# Patient Record
Sex: Female | Born: 1950
Health system: Southern US, Community
[De-identification: ages and names within clinical notes are randomized; demographics above are authoritative.]

## PROBLEM LIST (undated history)

## (undated) DIAGNOSIS — K219 Gastro-esophageal reflux disease without esophagitis: Secondary | ICD-10-CM

## (undated) DIAGNOSIS — E213 Hyperparathyroidism, unspecified: Secondary | ICD-10-CM

## (undated) DIAGNOSIS — H269 Unspecified cataract: Secondary | ICD-10-CM

## (undated) DIAGNOSIS — N390 Urinary tract infection, site not specified: Secondary | ICD-10-CM

## (undated) DIAGNOSIS — M5416 Radiculopathy, lumbar region: Secondary | ICD-10-CM

## (undated) DIAGNOSIS — M810 Age-related osteoporosis without current pathological fracture: Secondary | ICD-10-CM

## (undated) DIAGNOSIS — M1990 Unspecified osteoarthritis, unspecified site: Secondary | ICD-10-CM

## (undated) DIAGNOSIS — F32A Depression, unspecified: Secondary | ICD-10-CM

## (undated) DIAGNOSIS — J45909 Unspecified asthma, uncomplicated: Secondary | ICD-10-CM

## (undated) DIAGNOSIS — M8580 Other specified disorders of bone density and structure, unspecified site: Secondary | ICD-10-CM

## (undated) DIAGNOSIS — E559 Vitamin D deficiency, unspecified: Secondary | ICD-10-CM

## (undated) DIAGNOSIS — D649 Anemia, unspecified: Secondary | ICD-10-CM

## (undated) DIAGNOSIS — E785 Hyperlipidemia, unspecified: Secondary | ICD-10-CM

## (undated) DIAGNOSIS — J40 Bronchitis, not specified as acute or chronic: Secondary | ICD-10-CM

## (undated) DIAGNOSIS — F4320 Adjustment disorder, unspecified: Secondary | ICD-10-CM

## (undated) DIAGNOSIS — T7840XA Allergy, unspecified, initial encounter: Secondary | ICD-10-CM

## (undated) HISTORY — DX: Radiculopathy, lumbar region: M54.16

## (undated) HISTORY — PX: BREAST LUMPECTOMY: SHX2

## (undated) HISTORY — DX: Age-related osteoporosis without current pathological fracture: M81.0

## (undated) HISTORY — DX: Hyperparathyroidism, unspecified: E21.3

## (undated) HISTORY — DX: Hyperlipidemia, unspecified: E78.5

## (undated) HISTORY — DX: Vitamin D deficiency, unspecified: E55.9

## (undated) HISTORY — DX: Adjustment disorder, unspecified: F43.20

## (undated) HISTORY — DX: Anemia, unspecified: D64.9

## (undated) HISTORY — DX: Unspecified cataract: H26.9

## (undated) HISTORY — DX: Hemochromatosis, unspecified: E83.119

## (undated) HISTORY — DX: Unspecified asthma, uncomplicated: J45.909

## (undated) HISTORY — DX: Unspecified osteoarthritis, unspecified site: M19.90

## (undated) HISTORY — DX: Bronchitis, not specified as acute or chronic: J40

## (undated) HISTORY — PX: POLYPECTOMY: SHX149

## (undated) HISTORY — DX: Allergy, unspecified, initial encounter: T78.40XA

## (undated) HISTORY — DX: Other specified disorders of bone density and structure, unspecified site: M85.80

## (undated) HISTORY — PX: COLONOSCOPY: SHX174

## (undated) HISTORY — DX: Gastro-esophageal reflux disease without esophagitis: K21.9

## (undated) HISTORY — DX: Urinary tract infection, site not specified: N39.0

## (undated) HISTORY — PX: BREAST BIOPSY: SHX20

---

## 1998-08-01 ENCOUNTER — Other Ambulatory Visit: Admission: RE | Admit: 1998-08-01 | Discharge: 1998-08-01 | Payer: Self-pay | Admitting: Obstetrics & Gynecology

## 1999-07-18 ENCOUNTER — Other Ambulatory Visit: Admission: RE | Admit: 1999-07-18 | Discharge: 1999-07-18 | Payer: Self-pay | Admitting: Obstetrics & Gynecology

## 2000-07-21 ENCOUNTER — Other Ambulatory Visit: Admission: RE | Admit: 2000-07-21 | Discharge: 2000-07-21 | Payer: Self-pay | Admitting: Obstetrics & Gynecology

## 2001-08-03 ENCOUNTER — Other Ambulatory Visit: Admission: RE | Admit: 2001-08-03 | Discharge: 2001-08-03 | Payer: Self-pay | Admitting: Obstetrics & Gynecology

## 2002-08-09 ENCOUNTER — Other Ambulatory Visit: Admission: RE | Admit: 2002-08-09 | Discharge: 2002-08-09 | Payer: Self-pay | Admitting: Obstetrics & Gynecology

## 2003-08-16 ENCOUNTER — Other Ambulatory Visit: Admission: RE | Admit: 2003-08-16 | Discharge: 2003-08-16 | Payer: Self-pay | Admitting: Obstetrics & Gynecology

## 2004-07-17 ENCOUNTER — Encounter (INDEPENDENT_AMBULATORY_CARE_PROVIDER_SITE_OTHER): Payer: Self-pay | Admitting: Specialist

## 2004-07-17 ENCOUNTER — Ambulatory Visit (HOSPITAL_COMMUNITY): Admission: RE | Admit: 2004-07-17 | Discharge: 2004-07-17 | Payer: Self-pay | Admitting: Gastroenterology

## 2004-08-15 ENCOUNTER — Other Ambulatory Visit: Admission: RE | Admit: 2004-08-15 | Discharge: 2004-08-15 | Payer: Self-pay | Admitting: Obstetrics & Gynecology

## 2005-08-19 ENCOUNTER — Other Ambulatory Visit: Admission: RE | Admit: 2005-08-19 | Discharge: 2005-08-19 | Payer: Self-pay | Admitting: Obstetrics & Gynecology

## 2008-10-09 ENCOUNTER — Observation Stay (HOSPITAL_COMMUNITY): Admission: EM | Admit: 2008-10-09 | Discharge: 2008-10-10 | Payer: Self-pay | Admitting: Emergency Medicine

## 2009-11-09 ENCOUNTER — Encounter: Admission: RE | Admit: 2009-11-09 | Discharge: 2009-11-09 | Payer: Self-pay | Admitting: Obstetrics & Gynecology

## 2010-02-09 ENCOUNTER — Encounter: Admission: RE | Admit: 2010-02-09 | Discharge: 2010-02-09 | Payer: Self-pay | Admitting: Family Medicine

## 2010-11-22 LAB — CARDIAC PANEL(CRET KIN+CKTOT+MB+TROPI)
CK, MB: 0.9 ng/mL (ref 0.3–4.0)
Total CK: 105 U/L (ref 7–177)
Troponin I: <0.01 ng/mL (ref 0.00–0.06)

## 2010-11-27 LAB — DIFFERENTIAL
Basophils Absolute: 0 10*3/uL (ref 0.0–0.1)
Eosinophils Absolute: 0.2 10*3/uL (ref 0.0–0.7)
Eosinophils Relative: 5 % (ref 0–5)
Neutrophils Relative %: 55 % (ref 43–77)

## 2010-11-27 LAB — COMPREHENSIVE METABOLIC PANEL
ALT: 22 U/L (ref 0–35)
AST: 28 U/L (ref 0–37)
CO2: 27 mEq/L (ref 19–32)
Calcium: 9.9 mg/dL (ref 8.4–10.5)
Chloride: 105 mEq/L (ref 96–112)
Glucose, Bld: 85 mg/dL (ref 70–99)
Potassium: 4.2 mEq/L (ref 3.5–5.1)
Sodium: 136 mEq/L (ref 135–145)
Total Bilirubin: 0.7 mg/dL (ref 0.3–1.2)

## 2010-11-27 LAB — CBC
HCT: 35.1 % — ABNORMAL LOW (ref 36.0–46.0)
RBC: 3.71 MIL/uL — ABNORMAL LOW (ref 3.87–5.11)
RDW: 13.4 % (ref 11.5–15.5)

## 2010-11-27 LAB — PROTIME-INR: Prothrombin Time: 13.4 seconds (ref 11.6–15.2)

## 2010-11-27 LAB — RAPID URINE DRUG SCREEN, HOSP PERFORMED: Tetrahydrocannabinol: NOT DETECTED

## 2010-11-27 LAB — POCT CARDIAC MARKERS
CKMB, poc: <1 ng/mL — ABNORMAL LOW (ref 1.0–8.0)
Myoglobin, poc: 33 ng/mL (ref 12–200)

## 2010-11-27 LAB — APTT: aPTT: 25 seconds (ref 24–37)

## 2010-11-27 LAB — CARDIAC PANEL(CRET KIN+CKTOT+MB+TROPI): Troponin I: <0.01 ng/mL (ref 0.00–0.06)

## 2010-12-25 NOTE — H&P (Signed)
Tiffany Reyes, Tiffany Reyes               ACCOUNT NO.:  [id number]   MEDICAL RECORD NO.:  [id number]          PATIENT TYPE:  INP   LOCATION:  3705                         FACILITY:  [facility]   PHYSICIAN:  Tiffany Reyes, MDDATE OF BIRTH:  Jan 24, 1951   DATE OF ADMISSION:  10/09/2008  DATE OF DISCHARGE:                              HISTORY & PHYSICAL   CHIEF COMPLAINT:  Chest pain.   HISTORY OF PRESENT ILLNESS:  Tiffany Reyes is a 60 year old white female  patient of Dr. Juluis Reyes who presents with chest pressure today.  She felt fatigued this morning.  She started having substernal chest  pressure and felt lightheaded.  This continued on periodically until she  arrived in the emergency room and received nitroglycerin.  She also  received aspirin today.  She had no shortness of breath.  She reports  having a similar sensation previously and had a negative stress test  many years ago.  Her only risk factor for heart disease is  hyperlipidemia, which she reports has been well controlled for several  years.   PAST MEDICAL HISTORY:  Hyperlipidemia and chronic urinary tract  infections.   MEDICATIONS:  Lipitor 10 mg a day, trazodone dose unknown, trimethoprim,  sulfamethoxazole as listed by the triage nurse, but she is reportedly  allergic to SULFA according to E-chart.   SOCIAL HISTORY:  She is a retired Runner, broadcasting/film/video.  She is married.  She drinks  2 glasses of wine about 5 days a week.  She denies drinking too excess.  She does not smoke.  She denies drug use.   FAMILY HISTORY:  Negative for early heart disease.   REVIEW OF SYSTEMS:  As above, otherwise negative.   PHYSICAL EXAMINATION:  VITAL SIGNS:  Temperature is 97.9, blood pressure  132/84, pulse 56, respiratory rate 12, and oxygen saturation 100% on  room air.  GENERAL:  The patient is well nourished and well developed, in no acute  distress.  HEENT:  Normocephalic, atraumatic.  Pupils equal, round, and reactive to  light.   Sclerae nonicteric.  Her speech is slightly slurred.  NECK:  Supple.  No lymphadenopathy.  LUNGS:  Clear to auscultation bilaterally without wheezes, rhonchi, or  rales.  CARDIOVASCULAR:  Regular rate and rhythm without murmurs, gallops, or  rubs.  No chest wall pain to palpation.  ABDOMEN:  Soft, nontender, nondistended.  GU/RECTAL:  Deferred.  EXTREMITIES:  No clubbing, cyanosis, or edema.  Pulses are intact.  No  calf tenderness.  Homan sign negative.   LABORATORY DATA:  CBC unremarkable except for platelet count of 141,000.  PT, PTT normal.  Complete metabolic panel unremarkable.  Point-of-care  enzymes normal.  EKG shows sinus bradycardia with a rate of 50.  Chest x-  ray shows nothing acute.   ASSESSMENT AND PLAN:  1. Chest pain:  The patient will be placed on 23-hour observation on      telemetry.  Continue aspirin.  She will have serial cardiac      enzymes.  I will make her nothing by mouth after midnight and in      case  she requires stress test or rules in for myocardial      infarction.  I have discussed the case with Dr. Anne Fu who will see      her in the morning to make recommendations.  2. Hyperlipidemia.  3. Recurrent urinary tract infections.  4. Sinus bradycardia.  I will also check a TSH given her bradycardia      and history of fatigue.      Tiffany L. Lendell Caprice, MD  Electronically Signed     CLS/MEDQ  D:  10/09/2008  T:  10/09/2008  Job:  [id number]   cc:   Tiffany Reyes, M.D.

## 2010-12-25 NOTE — Consult Note (Signed)
Tiffany Reyes, Tiffany Reyes NO.:  [id number]   MEDICAL RECORD NO.:  [id number]          PATIENT TYPE:  INP   LOCATION:  3705                         FACILITY:  [facility]   PHYSICIAN:  Jake Bathe, MD      DATE OF BIRTH:  February 24, 1951   DATE OF CONSULTATION:  DATE OF DISCHARGE:                                 CONSULTATION   PRIMARY CARE PHYSICIAN:  Juluis Rainier, MD   REFERRING PHYSICIAN:  Corinna L. Lendell Caprice, MD, [facility].   REASON FOR CONSULTATION:  Ms. Baysinger is being seen at the request of Dr.  Lendell Caprice for the evaluation of chest pressure.   HISTORY OF PRESENT ILLNESS:  A 60 year old female with hyperlipidemia,  well controlled on Lipitor 10 mg, in her usual state of health until  earlier today.  She sought medical attention for chest pressure at [facility] after 2 days of waxing and waning of light pressure, 2-  3/10 in intensity.  She states that she just did not feel right.  She  has been quite fatigued over the past 48 hours.  In the past several  years, she has had small fleeting episodes of chest pressure butterfly  trying to get out of my chest.  This episode recently was associated  with mild lightheadedness, and she stated that she had to take a deep  breath to help relieve or relax the pain.  Her husband gave her a Nexium  at approximately 10:30 this morning and at 11:30 a.m., she still felt  quite poor, and the pressure was slightly more intense.  This occurred  after ironing her sweater and pants or light exertional activity.   She exercises quite avidly 3-6 times per week, has active trainer.  She  has developed no exertional angina during these activities.  She does  not smoke.  She has no diabetes.  No history of hypertension.  She has  been treated for hyperlipidemia over the past few years.  Currently, in  the emergency department, she is without any chest pressure; however,  she still feels fatigued.  Her sister and brother  had both been  diagnosed with hemochromatosis; however, she has been tested and says  that she is negative.   PAST MEDICAL HISTORY:  1. Hyperlipidemia, on Lipitor 10 mg.  2. History of breast biopsies in the 90s, benign.   ALLERGIES:  1. PENICILLIN.  2. SULFA (however, she is on Bactrim).  3. TETANUS.  4. CODEINE.   MEDICATIONS AT HOME:  1. Lipitor 10 mg once a day.  2. Trazodone 50 mg at night for sleep.  3. Trimethoprim and sulfamethoxazole 200 mg once a day in the morning      for recurrent UTIs.   SOCIAL HISTORY:  She exercises 3-6 times per week and denies any tobacco  use.  She does drink 2 glasses of alcohol usually a day.  She is a  retired Engineer, site.   FAMILY HISTORY:  Her father died at age 103 after multiple strokes and  coronary artery disease.  Her mother had an irregular heartbeat  and had  bypass at age 58.  She has no early family history of coronary artery  disease.  Both her sister and brother had been diagnosed with  hemochromatosis.  She states that she has been tested and is negative.   REVIEW OF SYSTEMS:  She denies any syncope, bleeding, orthopnea, melena,  hematochezia, or hair loss.  Unless specified above, all other 12 review  of systems negative.   PHYSICAL EXAMINATION:  VITAL SIGNS:  Blood pressure 132/84, pulse 56,  respirations 12, temperature 97.9, sating 100% on room air.  GENERAL:  Alert, oriented x3, in no acute distress, pleasant.  HEENT:  Eyes, well-perfused conjunctivae.  EOMI.  No scleral icterus.  NECK:  Supple.  No lymphadenopathy.  No thyromegaly.  No carotid bruits.  No JVD.  Full range of motion.  CARDIOVASCULAR:  Bradycardic, regular rhythm without any appreciable  murmurs, rubs, or gallops.  Normal PMI.  LUNGS:  Clear to auscultation bilaterally.  Normal respiratory effort.  No wheezes.  No rales.  ABDOMEN:  Soft, nontender, normoactive bowel sounds.  No rebound.  No  guarding.  No epigastric tenderness.  EXTREMITIES:  No  clubbing, cyanosis, or edema.  Normal distal pulses.  SKIN:  Warm, dry, and intact.  No rashes.  PSYCH:  Normal affect.  NEUROLOGIC:  Nonfocal.  No tremors noted.   DATA:  An ECG, personally viewed, shows sinus bradycardia with no Q  waves, no ST changes.  Poor R-wave progression is noted.  Chest x-ray,  personally viewed, shows no acute airspace disease.   LABORATORY DATA:  First set of cardiac biomarkers, point-of-care are  normal.  White count is 4.3, hemoglobin is 12.3, hematocrit 35.1, MCV  94.5.  Sodium 136, potassium 4.2, chloride 105, bicarb 27, BUN 11,  creatinine 0.7, glucose 85.  Liver functions within normal limits.  INR  1.0, PTT 25.   ER medical records were reviewed.   ASSESSMENT/PLAN:  A 60 year old female with hyperlipidemia and atypical  chest pain.   Chest pain - fairly atypical story and reassuring exercise profile with  no evidence of any angina during exertional activity in the days up to  this event.  However, she does state that she does not feel well,  fatigued, mild chest pressure.  This may be an atypical warning sign or  symptom for acute coronary syndrome.  I do agree with observation  overnight with cycling of cardiac enzymes q.8 h.  I agree with checking  a TSH given her fatigue.  Given her borderline anemia, I asked if she  had any signs of any bleeding and she does not.  She has had a recent  colonoscopy, which was benign.  At this point, no need for beta-blockade  given her bradycardia.  Nitroglycerin p.r.n.  Although her pain was  relieved with nitroglycerin, this can happen with an esophageal spasm or  other GI entities.  This is not necessarily indicative of coronary  artery disease or spasm.  Continue with aspirin.  We will upgrade to  full-dose anticoagulation if cardiac biomarkers are abnormal.  Continue  with statin.  If cardiac biomarkers and ECG and she remains chest pain free, I would  be fine allowing her to be discharged in the morning  with close  outpatient followup and stress testing.  1. Fatigue - Dr. Lendell Caprice is checking a TSH, borderline anemia.      Workup per Dr. Lendell Caprice.  2. Hyperlipidemia - continue Lipitor 10 mg once a day.  I will see  her      in the morning.  Make n.p.o. just in case further testing is      needed.      Jake Bathe, MD  Electronically Signed     MCS/MEDQ  D:  10/09/2008  T:  10/09/2008  Job:  [id number]   cc:   Juluis Rainier, M.D.

## 2010-12-28 NOTE — Op Note (Signed)
Tiffany Reyes, Tiffany Reyes               ACCOUNT NO.:  [id number]   MEDICAL RECORD NO.:  [id number]          PATIENT TYPE:  AMB   LOCATION:  ENDO                         FACILITY:  [facility]   PHYSICIAN:  John C. Madilyn Fireman, M.D.    DATE OF BIRTH:  1950-09-16   DATE OF PROCEDURE:  07/17/2004  DATE OF DISCHARGE:                                 OPERATIVE REPORT   PROCEDURE:  Colonoscopy with polypectomy.   INDICATIONS FOR PROCEDURE:  Average-risk colon cancer screening.   PROCEDURE:  The patient was placed in the left lateral decubitus position  and placed on the pulse monitor with continuous low flow oxygen delivered by  nasal cannula.  She was sedated with 100 mcg IV fentanyl and 10 mg IV  Versed.  The Olympus video colonoscope is inserted into the rectum and  advanced to the cecum, confirmed by transillumination at McBurney's point  and visualization of the ileocecal valve and appendiceal orifice.  Prep is  excellent.  The cecum and ascending colon appeared normal with no masses,  polyps, diverticula, or other mucosal abnormalities.  In the transverse  colon, there was seen an 8 mm polyp which was fulgurated with hot biopsy.  The remainder of the transverse, descending, and sigmoid colon appeared  normal with no further masses, polyps, diverticula, or other mucosal  abnormalities.  In the rectum there was seen a 5 mm polyp, which was also  fulgurated by hot biopsy.  The remainder of the rectum appeared normal.  The  scope was then withdrawn, and the patient returned to the recovery room in  stable condition.  She tolerated the procedure well, and there were no  immediate complications.   IMPRESSION:  Transverse and rectal colon polyps.   PLAN:  Await histology to determine the method and interval for future colon  screening.      JCH/MEDQ  D:  07/17/2004  T:  07/17/2004  Job:  [id number]   cc:   Dellis Anes. Idell Pickles, M.D.  [address]  [place]  [place] [address]  Fax: [phone]

## 2012-06-01 ENCOUNTER — Other Ambulatory Visit: Payer: Self-pay | Admitting: Gastroenterology

## 2012-09-26 ENCOUNTER — Emergency Department (HOSPITAL_COMMUNITY): Payer: Managed Care, Other (non HMO)

## 2012-09-26 ENCOUNTER — Encounter (HOSPITAL_COMMUNITY): Payer: Self-pay | Admitting: *Deleted

## 2012-09-26 ENCOUNTER — Emergency Department (HOSPITAL_COMMUNITY)
Admission: EM | Admit: 2012-09-26 | Discharge: 2012-09-26 | Disposition: A | Discharge status: Home / Self Care | Payer: Managed Care, Other (non HMO) | Attending: Emergency Medicine | Admitting: Emergency Medicine

## 2012-09-26 DIAGNOSIS — S6990XA Unspecified injury of unspecified wrist, hand and finger(s), initial encounter: Secondary | ICD-10-CM | POA: Insufficient documentation

## 2012-09-26 DIAGNOSIS — W108XXA Fall (on) (from) other stairs and steps, initial encounter: Secondary | ICD-10-CM | POA: Insufficient documentation

## 2012-09-26 DIAGNOSIS — Z79899 Other long term (current) drug therapy: Secondary | ICD-10-CM | POA: Insufficient documentation

## 2012-09-26 DIAGNOSIS — S92213A Displaced fracture of cuboid bone of unspecified foot, initial encounter for closed fracture: Secondary | ICD-10-CM | POA: Insufficient documentation

## 2012-09-26 DIAGNOSIS — W010XXA Fall on same level from slipping, tripping and stumbling without subsequent striking against object, initial encounter: Secondary | ICD-10-CM | POA: Insufficient documentation

## 2012-09-26 DIAGNOSIS — S92212A Displaced fracture of cuboid bone of left foot, initial encounter for closed fracture: Secondary | ICD-10-CM

## 2012-09-26 DIAGNOSIS — Y9301 Activity, walking, marching and hiking: Secondary | ICD-10-CM | POA: Insufficient documentation

## 2012-09-26 DIAGNOSIS — S59909A Unspecified injury of unspecified elbow, initial encounter: Secondary | ICD-10-CM | POA: Insufficient documentation

## 2012-09-26 DIAGNOSIS — S59919A Unspecified injury of unspecified forearm, initial encounter: Secondary | ICD-10-CM | POA: Insufficient documentation

## 2012-09-26 DIAGNOSIS — Y929 Unspecified place or not applicable: Secondary | ICD-10-CM | POA: Insufficient documentation

## 2012-09-26 DIAGNOSIS — S9030XA Contusion of unspecified foot, initial encounter: Secondary | ICD-10-CM | POA: Insufficient documentation

## 2012-09-26 DIAGNOSIS — Z7982 Long term (current) use of aspirin: Secondary | ICD-10-CM | POA: Insufficient documentation

## 2012-09-26 NOTE — ED Provider Notes (Signed)
History  This chart was scribed for non-physician practitioner Tiffany Crigler, PA working with Tiffany Hutching, MD by Tiffany Reyes, ED Scribe. This patient was seen in room WTR7/WTR7 and the patient's care was started at 19:31     CSN: [id number]  Arrival date & time 09/26/12  1903      Chief Complaint  Patient presents with  . Foot Injury    left    (Consider location/radiation/quality/duration/timing/severity/associated sxs/prior treatment) Patient is a 62 y.o. female presenting with foot injury. The history is provided by the patient. No language interpreter was used.  Foot Injury Associated symptoms: no back pain and no neck pain    Tiffany Reyes is a 62 y.o. female who presents to the Emergency Department complaining of constant moderate left forefoot pain as a result of a fall that occurred approximately 2 hours ago with associated swelling, difficulty with bearing weight on the left foot, difficultly with ambulation, and mild wrist pain.  The patient states she skipped a step on the deck and she landed on the left side and she felt "something move." She states she immediately elevated the foot and applied ice. She reports that she hopped into the shower before coming into the ED and she says " all her toes turned a blue-black" color. This resolved with elevation. She says he has a bad knee currently and she had her knee brace on during fall. She notes the foot pain is modified when it is flexed and elevated. She notes she has not taken any medications PTA and notes hx of several falls. She says she is not on any blood thinners and is otherwise in good condition.   Dr. Lavada Mesi, Orthopedist  The onset of this condition was acute. The course is constant.   History reviewed. No pertinent past medical history.  History reviewed. No pertinent past surgical history.  History reviewed. No pertinent family history.  History  Substance Use Topics  . Smoking status: Never Smoker    . Smokeless tobacco: Never Used  . Alcohol Use: Yes   Review of Systems  Constitutional: Negative for activity change.  HENT: Negative for neck pain.   Musculoskeletal: Positive for arthralgias and gait problem. Negative for back pain and joint swelling.       Positive left foot pain Positive left wrist pain  Skin: Positive for color change. Negative for wound.       Bruising to the left foot  Neurological: Negative for weakness and numbness.  All other systems reviewed and are negative.    Allergies  Review of patient's allergies indicates not on file.  Home Medications   Current Outpatient Rx  Name  Route  Sig  Dispense  Refill  . aspirin 325 MG tablet   Oral   Take 325 mg by mouth every 6 (six) hours as needed for pain. For pain         . atorvastatin (LIPITOR) 10 MG tablet   Oral   Take 10 mg by mouth every evening.         . traZODone (DESYREL) 50 MG tablet   Oral   Take 50 mg by mouth at bedtime.         Marland Kitchen trimethoprim (TRIMPEX) 100 MG tablet   Oral   Take 100 mg by mouth every morning.           BP 117/69  Pulse 65  Temp(Src) 98.4 F (36.9 C) (Oral)  Resp 18  SpO2 100%  Physical  Exam  Nursing note and vitals reviewed. Constitutional: She is oriented to person, place, and time. She appears well-developed and well-nourished. No distress.  HENT:  Head: Normocephalic and atraumatic.  Eyes: Conjunctivae and EOM are normal. Pupils are equal, round, and reactive to light.  Neck: Normal range of motion. Neck supple. No tracheal deviation present.  Cardiovascular: Normal rate.  Exam reveals no decreased pulses.   Pulses:      Dorsalis pedis pulses are 2+ on the right side, and 2+ on the left side.       Posterior tibial pulses are 2+ on the right side, and 2+ on the left side.  Pulmonary/Chest: Effort normal. No respiratory distress.  Abdominal: She exhibits no distension.  Musculoskeletal: She exhibits tenderness. She exhibits no edema.        Left shoulder: Normal.       Left elbow: Normal.       Left wrist: She exhibits tenderness. She exhibits normal range of motion, no bony tenderness, no swelling and no effusion.       Left hip: Normal.       Left knee: Normal.       Left ankle: She exhibits decreased range of motion, swelling and ecchymosis. Tenderness. Lateral malleolus tenderness found. No medial malleolus and no proximal fibula tenderness found. Achilles tendon normal.       Left hand: Normal. Normal sensation noted. Normal strength noted.       Left lower leg: Normal. She exhibits no tenderness, no swelling and no edema.       Left foot: She exhibits tenderness.       Feet:  2 + pedal pulses.Tender over the forefoot and lateral malleolus. Mild ecchymosis over the forefoot. No signs of compartment syndrome of the lower leg.   Neurological: She is alert and oriented to person, place, and time. No sensory deficit.  Motor, sensation, and vascular distal to the injury is fully intact.   Skin: Skin is warm and dry.  Psychiatric: She has a normal mood and affect. Her behavior is normal.    ED Course  Procedures (including critical care time) DIAGNOSTIC STUDIES: Oxygen Saturation is 100% on room air, normal by my interpretation.    COORDINATION OF CARE: 19:34: Physical exam performed.   Labs Reviewed - No data to display Dg Ankle Complete Left  09/26/2012  *RADIOLOGY REPORT*  Clinical Data: Fall with pain and bruising  LEFT ANKLE COMPLETE - 3+ VIEW  Comparison: None.  Findings: Mild dorsal soft tissue swelling.  No fracture or dislocation.  Ankle mortise intact.  IMPRESSION: As above.   Original Report Authenticated By: Davonna Belling, M.D.    Dg Foot Complete Left  09/26/2012  *RADIOLOGY REPORT*  Clinical Data: Larey Seat, pain  LEFT FOOT - COMPLETE 3+ VIEW  Comparison: None.  Findings: Mild dorsal and lateral soft tissue swelling.  Suspected nondisplaced fracture of the lateral aspect of the cuboid (arrow) along with possible  small avulsion (double arrow).  Plantar and Achilles spurs.  IMPRESSION: Suspected lateral cuboid fracture/avulsion.   Original Report Authenticated By: Davonna Belling, M.D.      1. Left cuboid fracture    Patient seen and examined. Work-up initiated.   Vital signs reviewed and are as follows: Filed Vitals:   09/26/12 1911  BP: 117/69  Pulse: 65  Temp: 98.4 F (36.9 C)  Resp: 18   X-ray images reviewed by Dr. Adriana Simas and myself. Patient informed.  Patient has had no further color change in her  toes. Re-exam is unchanged.  Cam walker by ortho tech. Patient has crutches at home that she states that she will use. She will followup with her orthopedic physician next week and use over-the-counter pain medications for discomfort.  Patient was counseled on RICE protocol and told to rest injury, use ice for no longer than 15 minutes every hour, compress the area, and elevate above the level of their heart as much as possible to reduce swelling.  Questions answered.  Patient verbalized understanding.       MDM  Patient with foot injury. Likely cuboid fracture on x-ray. Treated with Cam Walker. Patient has orthopedic follow up.  Patient noted color change of toes after the incident. Do not suspect serious vascular injury at this time. Patient has good pedal pulses bilaterally. Color change has not reoccurred in emergency department and she currently has normal cap refill, sensation, and motor. No signs of compartment syndrome. compartments are soft. Patient will return with worsening pain or color change.  I personally performed the services described in this documentation, which was scribed in my presence. The recorded information has been reviewed and is accurate.         Tiffany Reyes, [place] 09/26/12 2115

## 2012-09-26 NOTE — ED Notes (Signed)
Pt ambulating independently w/ steady gait  W/ assistance of crutches on d/c in no acute distress, A&Ox4. D/c instructions reviewed w/ pt and family - pt and family deny any further questions or concerns at present.

## 2012-09-26 NOTE — ED Notes (Signed)
Pt states was walking down deck steps, missed a step and fell on L side, complaining of L foot pain, states 10/10 to walk on, 2/10 elevated in chair, pt states placed ice on L foot. Also states L wrist is tender.

## 2012-09-26 NOTE — ED Provider Notes (Signed)
Medical screening examination/treatment/procedure(s) were conducted as a shared visit with non-physician practitioner(s) and myself.  I personally evaluated the patient during the encounter.  X-ray reveals nondisplaced avulsion fracture of the cuboid bone.  Immobilize, referral to orthopedic  [name], MD 09/26/12 2222

## 2013-03-05 ENCOUNTER — Other Ambulatory Visit: Payer: Self-pay

## 2014-07-05 ENCOUNTER — Other Ambulatory Visit: Payer: Self-pay | Admitting: Obstetrics and Gynecology

## 2014-08-09 ENCOUNTER — Encounter: Payer: Self-pay | Admitting: *Deleted

## 2014-10-31 ENCOUNTER — Ambulatory Visit (INDEPENDENT_AMBULATORY_CARE_PROVIDER_SITE_OTHER): Payer: PRIVATE HEALTH INSURANCE | Admitting: Endocrinology

## 2014-10-31 ENCOUNTER — Encounter: Payer: Self-pay | Admitting: Endocrinology

## 2014-10-31 DIAGNOSIS — E213 Hyperparathyroidism, unspecified: Secondary | ICD-10-CM | POA: Diagnosis not present

## 2014-10-31 DIAGNOSIS — M810 Age-related osteoporosis without current pathological fracture: Secondary | ICD-10-CM

## 2014-10-31 LAB — PHOSPHORUS: Phosphorus: 3.2 mg/dL (ref 2.3–4.6)

## 2014-10-31 LAB — TSH: TSH: 2.28 u[IU]/mL (ref 0.35–4.50)

## 2014-10-31 NOTE — Patient Instructions (Addendum)
blood tests are being requested for you today.  We'll let you know about the results.   Osteoporosis is helped by exercise, and avoidance of alcohol.   it is critically important to prevent falling down (keep floor areas well-lit, dry, and free of loose objects.  If you have a cane, walker, or wheelchair, you should use it, even for short trips around the house.  Also, try not to rush).   It is ok to take the medrol "pack," as it is just short-term.  Let's also check the 24-HR urine for calcium.  If everything is ok on the tests, please consider taking a medication called "fosamax."

## 2014-10-31 NOTE — Progress Notes (Signed)
Subjective:    Patient ID: Tiffany Reyes, female    DOB: 1951-05-18, 64 y.o.   MRN: [id number]  HPI Pt was noted to have osteoporosis in 2016.  She has never been on medication for this, except for vitamin-D supplement.  She had a foot fx in 2014 (no other recent fxs).  She has no history of any of the following: early menopause, multiple myeloma, renal dz, thyroid problems, prolonged bedrest, prolonged steroid rx, smoking, liver dz, heparin, or anticonvulsants.  She drinks EtOH approx 10 drinks per week.   Pt was first noted to have hypercalcemia in 2012.  she has never had urolithiasis, PUD, pancreatitis, or depression.  she does not take a vitamin-A supplement.  She has moderate arthralgias throughout the body, but no assoc falls.   Past Medical History  Diagnosis Date  . Hyperlipidemia   . Osteopenia   . Vitamin D deficiency   . Hemochromatosis   . Arthritis     Past Surgical History  Procedure Laterality Date  . Breast biopsy Left   . Colonoscopy      History   Social History  . Marital Status: Married    Spouse Name: N/A  . Number of Children: N/A  . Years of Education: N/A   Occupational History  . Not on file.   Social History Main Topics  . Smoking status: Never Smoker   . Smokeless tobacco: Never Used  . Alcohol Use: Yes  . Drug Use: No  . Sexual Activity: Not on file   Other Topics Concern  . Not on file   Social History Narrative    Current Outpatient Prescriptions on File Prior to Visit  Medication Sig Dispense Refill  . atorvastatin (LIPITOR) 10 MG tablet Take 10 mg by mouth every evening. Take 1/2 tablet once daily.    . traZODone (DESYREL) 50 MG tablet Take 50 mg by mouth at bedtime as needed.     . trimethoprim (TRIMPEX) 100 MG tablet Take 100 mg by mouth every morning.     No current facility-administered medications on file prior to visit.    Allergies  Allergen Reactions  . Ambien [Zolpidem]   . Codeine Nausea Only  . Sulfa  Antibiotics Other (See Comments)    Unknown reaction  . Tetanus Toxoids Swelling    August 1976 last tetanus expierenced intense fever. Swelling in arm.  . Excedrin Pm [Diphenhydramine-Apap (Sleep)]   . Penicillins Rash    Family History  Problem Relation Age of Onset  . CAD Mother   . Kidney failure Father   . CVA Father   . Hemochromatosis Sister   . Hemochromatosis Brother   . Osteoporosis Mother   . Osteoporosis Father     BP 122/80 mmHg  Pulse 67  Temp(Src) 97.9 F (36.6 C) (Oral)  Ht 5' 2.5" (1.588 m)  Wt 124 lb (56.246 kg)  BMI 22.30 kg/m2  SpO2 97%  Review of Systems denies cramps, memory loss, back pain, easy bruising, rhinorrhea, weight loss, galactorrhea, hematuria, numbness, arthralgias, abdominal pain, muscle weakness, skin rash, visual loss, sob, and diarrhea.  She has cold intolerance.      Objective:   Physical Exam VS: see vs page GEN: no distress HEAD: head: no deformity eyes: no periorbital swelling, no proptosis external nose and ears are normal mouth: no lesion seen NECK: supple, thyroid is not enlarged CHEST WALL: no kyphosis. LUNGS:  Clear to auscultation CV: reg rate and rhythm, no murmur ABD: abdomen is soft,  nontender.  no hepatosplenomegaly.  not distended.  no hernia.   MUSCULOSKELETAL: muscle bulk and strength are grossly normal.  no obvious joint swelling.  gait is normal and steady EXTEMITIES: no deformity.  no ulcer on the feet.  feet are of normal color and temp.  no edema PULSES: dorsalis pedis intact bilat.  no carotid bruit NEURO:  cn 2-12 grossly intact.   readily moves all 4's.  sensation is intact to touch on the feet SKIN:  Normal texture and temperature.  No rash or suspicious lesion is visible.  Vitiligo is noted.  NODES:  None palpable at the neck PSYCH: alert, well-oriented.  Does not appear anxious nor depressed.      Radiol: (i reviewed dexa report) FRAX 6/22%  i have reviewed the following old records: Office  note from Dr Patsey Berthold, 10/13/14  outside test results are reviewed: PTH=63 Ca++=11.7 25-OH-vit-D=29 AP/creat=normal  Lab Results  Component Value Date   CALCIUM 9.9 10/09/2008   PHOS 3.2 10/31/2014   24-HR urine Ca++=138 mg    Assessment & Plan:  Primary hyperparathyroidism, mild Vitiligo, new to me: this increases the risk of autoimmune disease in the future Osteoporosis: moderate. Arthralgias, not related to the above: rx per PCP  Patient is advised the following: Patient Instructions  blood tests are being requested for you today.  We'll let you know about the results.   Osteoporosis is helped by exercise, and avoidance of alcohol.   it is critically important to prevent falling down (keep floor areas well-lit, dry, and free of loose objects.  If you have a cane, walker, or wheelchair, you should use it, even for short trips around the house.  Also, try not to rush).   It is ok to take the medrol "pack," as it is just short-term.  Let's also check the 24-HR urine for calcium.  If everything is ok on the tests, please consider taking a medication called "fosamax."    (will fax report to [phone])  addendum: i have sent a prescription to your pharmacy, for fosamax. We'll follow the PTH level

## 2014-11-01 LAB — PTH, INTACT AND CALCIUM
CALCIUM: 10.3 mg/dL (ref 8.4–10.5)
PTH: 104 pg/mL — AB (ref 14–64)

## 2014-11-01 LAB — VITAMIN D 25 HYDROXY (VIT D DEFICIENCY, FRACTURES): VITD: 39.77 ng/mL (ref 30.00–100.00)

## 2014-11-02 ENCOUNTER — Other Ambulatory Visit: Payer: BC Managed Care – PPO

## 2014-11-02 ENCOUNTER — Other Ambulatory Visit: Payer: Self-pay | Admitting: Endocrinology

## 2014-11-02 LAB — PROTEIN ELECTROPHORESIS, SERUM
ALBUMIN ELP: 4.5 g/dL (ref 3.8–4.8)
ALPHA-1-GLOBULIN: 0.3 g/dL (ref 0.2–0.3)
ALPHA-2-GLOBULIN: 0.7 g/dL (ref 0.5–0.9)
BETA 2: 0.3 g/dL (ref 0.2–0.5)
BETA GLOBULIN: 0.4 g/dL (ref 0.4–0.6)
GAMMA GLOBULIN: 0.9 g/dL (ref 0.8–1.7)
Total Protein, Serum Electrophoresis: 7.1 g/dL (ref 6.1–8.1)

## 2014-11-03 ENCOUNTER — Telehealth: Payer: Self-pay | Admitting: Endocrinology

## 2014-11-03 LAB — CALCIUM, URINE, 24 HOUR
CALCIUM 24HR UR: 138 mg/d (ref 100–250)
CALCIUM UR: 6 mg/dL

## 2014-11-03 MED ORDER — ALENDRONATE SODIUM 70 MG PO TABS: 70.0000 mg | ORAL_TABLET | ORAL | Status: DC

## 2014-11-03 NOTE — Telephone Encounter (Signed)
Pt called returning your call from today

## 2014-11-10 NOTE — Telephone Encounter (Signed)
Patient would like to talk to you concerning her medication. °

## 2014-11-10 NOTE — Telephone Encounter (Signed)
Contacted pt and we discussed lab work. Pt advised based on the most recent lab work Dr. Everardo AllEllison wanted her to start taking Fosamax. I advised pt I did not have access to all of the results and I could only discuss what was in the result notes. Pt voiced understanding and stated she would begin fosamax and would return in 4 months for her follow up appointment.

## 2014-11-12 ENCOUNTER — Other Ambulatory Visit: Payer: Self-pay | Admitting: Endocrinology

## 2014-11-12 DIAGNOSIS — D892 Hypergammaglobulinemia, unspecified: Secondary | ICD-10-CM | POA: Insufficient documentation

## 2014-11-17 ENCOUNTER — Telehealth: Payer: Self-pay | Admitting: Endocrinology

## 2014-11-17 NOTE — Telephone Encounter (Signed)
Patient stated that she need some interpretation of her lab work. Please advise

## 2014-11-18 ENCOUNTER — Telehealth: Payer: Self-pay | Admitting: Endocrinology

## 2014-11-18 NOTE — Telephone Encounter (Signed)
Patient called stating she would like to know some information on her lab results    Please advise patient    Thank you

## 2014-11-18 NOTE — Telephone Encounter (Signed)
Contacted pt and advised we are still waiting on 1 blood test to be resulted. Pt will be notified once results are done.

## 2014-11-18 NOTE — Telephone Encounter (Signed)
Contacted pt and advised we are still waiting on the IFE to come back. Pt voiced understanding,

## 2014-12-05 ENCOUNTER — Telehealth: Payer: Self-pay | Admitting: Endocrinology

## 2014-12-05 NOTE — Telephone Encounter (Signed)
Pt called and wants to know how to take har medication based on her test results

## 2014-12-05 NOTE — Telephone Encounter (Signed)
The parathyroid is slightly elevated, but all we need to do is recheck that in the future, as the calcium is high-normal.

## 2014-12-05 NOTE — Telephone Encounter (Signed)
please call patient: Please start taking fosamax. Also, please ask pt to come back in to check IFE.  Please tel pt we are doing this for completeness, but it is unlikely to be abnormal.

## 2014-12-05 NOTE — Telephone Encounter (Signed)
I contacted the patient and advised of note below. Pt voiced understanding. Patient wanted to know what her Parathyroid numbers meant as well. Please advise,  Thanks!

## 2014-12-05 NOTE — Telephone Encounter (Signed)
See note below and please advise. Pt called wanting to know about the rest of her blood work. I was notified by the lab today the Lab from 11/12/2014, the IFE could not be added on.

## 2014-12-06 NOTE — Telephone Encounter (Signed)
Left voicemail advising of note below. Requested call back if pt would like to discuss.  

## 2014-12-07 ENCOUNTER — Other Ambulatory Visit: Payer: PRIVATE HEALTH INSURANCE

## 2014-12-07 DIAGNOSIS — D892 Hypergammaglobulinemia, unspecified: Secondary | ICD-10-CM

## 2014-12-09 LAB — IMMUNOFIXATION ELECTROPHORESIS
IGM, SERUM: 70 mg/dL (ref 52–322)
IgA: 202 mg/dL (ref 69–380)
IgG (Immunoglobin G), Serum: 974 mg/dL (ref 690–1700)
Total Protein, Serum Electrophoresis: 6.8 g/dL (ref 6.0–8.3)

## 2015-01-10 ENCOUNTER — Telehealth: Payer: Self-pay | Admitting: Endocrinology

## 2015-01-10 MED ORDER — IBANDRONATE SODIUM 150 MG PO TABS: 150.0000 mg | ORAL_TABLET | ORAL | Status: DC

## 2015-01-10 NOTE — Telephone Encounter (Signed)
Ok, i have sent a prescription to your pharmacy, to change to boniva (once per month).

## 2015-01-10 NOTE — Telephone Encounter (Signed)
[facility] note dated 01/09/15 10:40 AM  Nurse Assessment Nurse: Apolinar JunesBrandon, RN, Darl PikesSusan Date/Time Tiffany Reyes(Eastern Time): 01/09/2015 10:40:58 AM Confirm and document reason for call. If symptomatic, describe symptoms. ---Caller states they began new medication last month now they are having body pain and headache along with nausea - states she just needs to know if she can leave a message for Dr Everardo AllEllison to see if she needs to keep taking it or does she have to come in for a visit Has the patient traveled out of the country within the last 30 days? ---Not Applicable Does the patient require triage? ---No Please document clinical information provided and list any resource used. ---Advised caller we will send a message to the office call back tomorrow and talk to Dr Everardo AllEllison nurse and see if she can get a message to Dr Everardo AllEllison for answers

## 2015-01-10 NOTE — Telephone Encounter (Signed)
Patient advised a new medication has been sent. Patient voiced understanding.

## 2015-02-06 ENCOUNTER — Other Ambulatory Visit: Payer: Self-pay

## 2015-02-14 ENCOUNTER — Ambulatory Visit: Payer: BC Managed Care – PPO | Admitting: Endocrinology

## 2015-03-21 ENCOUNTER — Encounter: Payer: Self-pay | Admitting: Endocrinology

## 2015-03-21 ENCOUNTER — Ambulatory Visit (INDEPENDENT_AMBULATORY_CARE_PROVIDER_SITE_OTHER): Payer: PRIVATE HEALTH INSURANCE | Admitting: Endocrinology

## 2015-03-21 VITALS — BP 124/70 | HR 56 | Temp 97.6°F | Ht 62.5 in | Wt 122.0 lb

## 2015-03-21 DIAGNOSIS — E559 Vitamin D deficiency, unspecified: Secondary | ICD-10-CM | POA: Diagnosis not present

## 2015-03-21 DIAGNOSIS — E213 Hyperparathyroidism, unspecified: Secondary | ICD-10-CM

## 2015-03-21 NOTE — Progress Notes (Signed)
Subjective:    Patient ID: Tiffany Reyes, female    DOB: November 12, 1950, 64 y.o.   MRN: [id number]  HPI Pt returns for f/u of primary hyperparathyroidism (she was noted to have osteoporosis in 2016; she did not tolerate fosamax, due to flu-like symptoms; she had a foot fx in 2014 (no other recent fxs); she drinks EtOH approx 10 drinks per week, but no other cause was found; she was first noted to have hypercalcemia in 2012).  On boniva, pt states she feels well in general, except for fatigue.   She takes non-prescription vit-D, 3x2000 units/day.  Past Medical History  Diagnosis Date  . Hyperlipidemia   . Osteopenia   . Vitamin D deficiency   . Hemochromatosis   . Arthritis     Past Surgical History  Procedure Laterality Date  . Breast biopsy Left   . Colonoscopy      Social History   Social History  . Marital Status: Married    Spouse Name: N/A  . Number of Children: N/A  . Years of Education: N/A   Occupational History  . Not on file.   Social History Main Topics  . Smoking status: Never Smoker   . Smokeless tobacco: Never Used  . Alcohol Use: Yes  . Drug Use: No  . Sexual Activity: Not on file   Other Topics Concern  . Not on file   Social History Narrative    Current Outpatient Prescriptions on File Prior to Visit  Medication Sig Dispense Refill  . atorvastatin (LIPITOR) 10 MG tablet Take 10 mg by mouth every evening. Take 1/2 tablet once daily.    . Cholecalciferol 2000 UNITS TBDP Take 6,000 Units by mouth. Twice a day.    . ibandronate (BONIVA) 150 MG tablet Take 1 tablet (150 mg total) by mouth every 30 (thirty) days. Take in the morning with a full glass of water, on an empty stomach, and do not take anything else by mouth or lie down for the next 30 min. 3 tablet 3  . meloxicam (MOBIC) 7.5 MG tablet Take 7.5 mg by mouth daily.    . methylPREDNISolone (MEDROL) 4 MG tablet Take 4 mg by mouth daily.    . Omega-3 Fatty Acids (FISH OIL) 1200 MG CAPS Take by  mouth.    . traZODone (DESYREL) 50 MG tablet Take 50 mg by mouth at bedtime as needed.     . trimethoprim (TRIMPEX) 100 MG tablet Take 100 mg by mouth every morning.     No current facility-administered medications on file prior to visit.    Allergies  Allergen Reactions  . Ambien [Zolpidem]   . Codeine Nausea Only  . Sulfa Antibiotics Other (See Comments)    Unknown reaction  . Tetanus Toxoids Swelling    August 1976 last tetanus expierenced intense fever. Swelling in arm.  . Excedrin Pm [Diphenhydramine-Apap (Sleep)]   . Penicillins Rash    Family History  Problem Relation Age of Onset  . CAD Mother   . Kidney failure Father   . CVA Father   . Hemochromatosis Sister   . Hemochromatosis Brother   . Osteoporosis Mother   . Osteoporosis Father     BP 124/70 mmHg  Pulse 56  Temp(Src) 97.6 F (36.4 C) (Oral)  Ht 5' 2.5" (1.588 m)  Wt 122 lb (55.339 kg)  BMI 21.94 kg/m2  SpO2 98%  Review of Systems Denies falls.      Objective:   Physical Exam VITAL SIGNS:  See vs page GENERAL: no distress Gait: normal and steady.   outside test results are reviewed: Ca++=10.6  Lab Results  Component Value Date   PTH 105* 03/21/2015   CALCIUM 10.2 03/21/2015   PHOS 3.2 10/31/2014  vit-D=normal    Assessment & Plan:  Primary hyperparathyroidism, mild Osteoporosis, tolerating rx well.   Patient is advised the following: Patient Instructions  blood tests are being requested for you today.  We'll let you know about the results.   Please continue the same Boniva. Please return in 1 year.

## 2015-03-21 NOTE — Patient Instructions (Addendum)
blood tests are being requested for you today.  We'll let you know about the results.   Please continue the same Boniva. Please return in 1 year.

## 2015-03-22 LAB — VITAMIN D 25 HYDROXY (VIT D DEFICIENCY, FRACTURES): VITD: 56.5 ng/mL (ref 30.00–100.00)

## 2015-03-22 LAB — PTH, INTACT AND CALCIUM
Calcium: 10.2 mg/dL (ref 8.4–10.5)
PTH: 105 pg/mL — ABNORMAL HIGH (ref 14–64)

## 2015-05-11 ENCOUNTER — Telehealth: Payer: Self-pay | Admitting: Endocrinology

## 2015-05-11 NOTE — Telephone Encounter (Signed)
See note below and please advise, Thanks! 

## 2015-05-11 NOTE — Telephone Encounter (Signed)
Attempted to reach the pt. Will contacted the pt at a later time due to the pt be unavailable.

## 2015-05-11 NOTE — Telephone Encounter (Signed)
Pt failed to take the med ibandronate sodium 150 mg when she was supposed to she is 3 days behind can she take it still tomorrow am

## 2015-05-11 NOTE — Telephone Encounter (Signed)
yes

## 2015-05-12 NOTE — Telephone Encounter (Addendum)
I contacted the pt and advised it was ok to start taking her Boniva medication again even though she had missed a few dosages. Pt voiced understanding.

## 2015-12-30 ENCOUNTER — Ambulatory Visit (INDEPENDENT_AMBULATORY_CARE_PROVIDER_SITE_OTHER): Payer: Medicare Other | Admitting: Family Medicine

## 2015-12-30 ENCOUNTER — Ambulatory Visit (INDEPENDENT_AMBULATORY_CARE_PROVIDER_SITE_OTHER): Payer: Medicare Other

## 2015-12-30 VITALS — BP 122/86 | HR 63 | Temp 98.2°F | Resp 16 | Ht 63.0 in | Wt 123.0 lb

## 2015-12-30 DIAGNOSIS — S60222A Contusion of left hand, initial encounter: Secondary | ICD-10-CM | POA: Diagnosis not present

## 2015-12-30 LAB — POCT CBC
GRANULOCYTE PERCENT: 78 % (ref 37–80)
HEMATOCRIT: 39.1 % (ref 37.7–47.9)
Hemoglobin: 13.8 g/dL (ref 12.2–16.2)
Lymph, poc: 1.4 (ref 0.6–3.4)
MCH: 31.6 pg — AB (ref 27–31.2)
MCHC: 35.3 g/dL (ref 31.8–35.4)
MCV: 89.5 fL (ref 80–97)
MID (CBC): 0.4 (ref 0–0.9)
MPV: 7.3 fL (ref 0–99.8)
POC GRANULOCYTE: 6.3 (ref 2–6.9)
POC LYMPH PERCENT: 17.1 %L (ref 10–50)
POC MID %: 4.9 % (ref 0–12)
Platelet Count, POC: 165 10*3/uL (ref 142–424)
RBC: 4.37 M/uL (ref 4.04–5.48)
RDW, POC: 14.3 %
WBC: 8.1 10*3/uL (ref 4.6–10.2)

## 2015-12-30 LAB — PROTIME-INR
INR: 0.95 (ref <1.50)
Prothrombin Time: 12.8 seconds (ref 11.6–15.2)

## 2015-12-30 NOTE — Progress Notes (Signed)
Tiffany Reyes is a 65 y.o. female who presents to Urgent Care today for left hand turning black. Patient notes a 40 history of her left hand turning black. She notes a hyperpigmented area on the dorsal aspect of her hand near the second and third MCPs. She denies any pain numbness or tenderness or loss of function. She can't remember any specific injury to her hand but not she's been very active recently and thinks she may have hit it on something. She took some aspirin the day before her hand development the darker pigmented. She feels well otherwise.   Past Medical History  Diagnosis Date  . Hyperlipidemia   . Osteopenia   . Vitamin D deficiency   . Hemochromatosis   . Arthritis    Past Surgical History  Procedure Laterality Date  . Breast biopsy Left   . Colonoscopy     Social History  Substance Use Topics  . Smoking status: Never Smoker   . Smokeless tobacco: Never Used  . Alcohol Use: Yes   ROS as above Medications: Current Outpatient Prescriptions  Medication Sig Dispense Refill  . atorvastatin (LIPITOR) 10 MG tablet Take 10 mg by mouth every evening. Take 1/2 tablet once daily.    . Cholecalciferol 2000 UNITS TBDP Take 6,000 Units by mouth. Twice a day.    . ibandronate (BONIVA) 150 MG tablet Take 1 tablet (150 mg total) by mouth every 30 (thirty) days. Take in the morning with a full glass of water, on an empty stomach, and do not take anything else by mouth or lie down for the next 30 min. 3 tablet 3  . Omega-3 Fatty Acids (FISH OIL) 1200 MG CAPS Take by mouth.    . traZODone (DESYREL) 50 MG tablet Take 50 mg by mouth at bedtime as needed.     . trimethoprim (TRIMPEX) 100 MG tablet Take 100 mg by mouth every morning.     No current facility-administered medications for this visit.   Allergies  Allergen Reactions  . Ambien [Zolpidem]   . Codeine Nausea Only  . Sulfa Antibiotics Other (See Comments)    Unknown reaction  . Tetanus Toxoids Swelling    August 1976 last  tetanus expierenced intense fever. Swelling in arm.  . Excedrin Pm [Diphenhydramine-Apap (Sleep)]   . Penicillins Rash     Exam:  BP 122/86 mmHg  Pulse 63  Temp(Src) 98.2 F (36.8 C) (Oral)  Resp 16  Ht 5\' 3"  (1.6 m)  Wt 123 lb (55.792 kg)  BMI 21.79 kg/m2  SpO2 99% Gen: Well NAD HEENT: EOMI,  MMM Lungs: Normal work of breathing. CTABL Heart: RRR no MRG Abd: NABS, Soft. Nondistended, Nontender Exts: Brisk capillary refill, warm and well perfused.  Left hand: Ecchymosis without tenderness surrounding the second and third MCPs extending onto the radial side of the dorsal hand. Pulses capillary refill sensation intact distally.  Results for orders placed or performed in visit on 12/30/15 (from the past 24 hour(s))  POCT CBC     Status: Abnormal   Collection Time: 12/30/15  9:24 AM  Result Value Ref Range   WBC 8.1 4.6 - 10.2 K/uL   Lymph, poc 1.4 0.6 - 3.4   POC LYMPH PERCENT 17.1 10 - 50 %L   MID (cbc) 0.4 0 - 0.9   POC MID % 4.9 0 - 12 %M   POC Granulocyte 6.3 2 - 6.9   Granulocyte percent 78.0 37 - 80 %G   RBC 4.37 4.04 -  5.48 M/uL   Hemoglobin 13.8 12.2 - 16.2 g/dL   HCT, POC 96.0 45.4 - 47.9 %   MCV 89.5 80 - 97 fL   MCH, POC 31.6 (A) 27 - 31.2 pg   MCHC 35.3 31.8 - 35.4 g/dL   RDW, POC 09.8 %   Platelet Count, POC 165 142 - 424 K/uL   MPV 7.3 0 - 99.8 fL   Dg Hand Complete Left  12/30/2015  CLINICAL DATA:  65 year old female with a history of hand injury EXAM: LEFT HAND - COMPLETE 3+ VIEW COMPARISON:  None. FINDINGS: No acute fracture. Degenerative changes of the interphalangeal joints. No radiopaque foreign body. No significant focal soft tissue swelling. IMPRESSION: Negative for acute bony abnormality.  Degenerative changes. Signed, Yvone Neu. Loreta Ave, DO [facility] Electronically Signed   By: Gilmer Mor D.O.   On: 12/30/2015 09:36    Assessment and Plan: 65 y.o. female with Probable contusion. INR  pending. Plan for watchful waiting follow-up with PCP as needed.  Discussed warning signs or symptoms. Please see discharge instructions. Patient expresses understanding.

## 2015-12-30 NOTE — Patient Instructions (Addendum)
Thank you for coming in today. I think the darker pigmented on her hand is just a bruise. We will call with results. Return if not improving or follow-up with primary care provider.   Contusion A contusion is a deep bruise. Contusions are the result of a blunt injury to tissues and muscle fibers under the skin. The injury causes bleeding under the skin. The skin overlying the contusion may turn blue, purple, or yellow. Minor injuries will give you a painless contusion, but more severe contusions may stay painful and swollen for a few weeks.  CAUSES  This condition is usually caused by a blow, trauma, or direct force to an area of the body. SYMPTOMS  Symptoms of this condition include:  Swelling of the injured area.  Pain and tenderness in the injured area.  Discoloration. The area may have redness and then turn blue, purple, or yellow. DIAGNOSIS  This condition is diagnosed based on a physical exam and medical history. An X-ray, CT scan, or MRI may be needed to determine if there are any associated injuries, such as broken bones (fractures). TREATMENT  Specific treatment for this condition depends on what area of the body was injured. In general, the best treatment for a contusion is resting, icing, applying pressure to (compression), and elevating the injured area. This is often called the RICE strategy. Over-the-counter anti-inflammatory medicines may also be recommended for pain control.  HOME CARE INSTRUCTIONS   Rest the injured area.  If directed, apply ice to the injured area:  Put ice in a plastic bag.  Place a towel between your skin and the bag.  Leave the ice on for 20 minutes, 2-3 times per day.  If directed, apply light compression to the injured area using an elastic bandage. Make sure the bandage is not wrapped too tightly. Remove and reapply the bandage as directed by your health care provider.  If possible, raise (elevate) the injured area above the level of  your heart while you are sitting or lying down.  Take over-the-counter and prescription medicines only as told by your health care provider. SEEK MEDICAL CARE IF:  Your symptoms do not improve after several days of treatment.  Your symptoms get worse.  You have difficulty moving the injured area. SEEK IMMEDIATE MEDICAL CARE IF:   You have severe pain.  You have numbness in a hand or foot.  Your hand or foot turns pale or cold.   This information is not intended to replace advice given to you by your health care provider. Make sure you discuss any questions you have with your health care provider.   Document Released: 05/08/2005 Document Revised: 04/19/2015 Document Reviewed: 12/14/2014 Elsevier Interactive Patient Education 2016 [employer]    IF you received an x-ray today, you will receive an invoice from [facility]. Please contact [facility] at [phone] with questions or concerns regarding your invoice.   IF you received labwork today, you will receive an invoice from [facility]. Please contact [facility] at [phone] with questions or concerns regarding your invoice.   Our billing staff will not be able to assist you with questions regarding bills from these companies.  You will be contacted with the lab results as soon as they are available. The fastest way to get your results is to activate your My Chart account. Instructions are located on the last page of this paperwork. If you have not heard from us regarding the results in 2 weeks, please contact  this office.

## 2016-01-11 ENCOUNTER — Emergency Department (HOSPITAL_COMMUNITY): Payer: Medicare Other

## 2016-01-11 ENCOUNTER — Emergency Department (HOSPITAL_COMMUNITY)
Admission: EM | Admit: 2016-01-11 | Discharge: 2016-01-12 | Disposition: A | Discharge status: Home / Self Care | Payer: Medicare Other | Attending: Emergency Medicine | Admitting: Emergency Medicine

## 2016-01-11 ENCOUNTER — Encounter (HOSPITAL_COMMUNITY): Payer: Self-pay | Admitting: Emergency Medicine

## 2016-01-11 DIAGNOSIS — R079 Chest pain, unspecified: Secondary | ICD-10-CM

## 2016-01-11 DIAGNOSIS — Z79899 Other long term (current) drug therapy: Secondary | ICD-10-CM | POA: Insufficient documentation

## 2016-01-11 DIAGNOSIS — R072 Precordial pain: Secondary | ICD-10-CM | POA: Insufficient documentation

## 2016-01-11 DIAGNOSIS — Z792 Long term (current) use of antibiotics: Secondary | ICD-10-CM | POA: Diagnosis not present

## 2016-01-11 DIAGNOSIS — E785 Hyperlipidemia, unspecified: Secondary | ICD-10-CM | POA: Insufficient documentation

## 2016-01-11 HISTORY — PX: TRANSTHORACIC ECHOCARDIOGRAM: SHX275

## 2016-01-11 LAB — I-STAT TROPONIN, ED: TROPONIN I, POC: 0 ng/mL (ref 0.00–0.08)

## 2016-01-11 LAB — BASIC METABOLIC PANEL
ANION GAP: 7 (ref 5–15)
BUN: 12 mg/dL (ref 6–20)
CALCIUM: 10.4 mg/dL — AB (ref 8.9–10.3)
CO2: 26 mmol/L (ref 22–32)
CREATININE: 0.77 mg/dL (ref 0.44–1.00)
Chloride: 105 mmol/L (ref 101–111)
GFR calc Af Amer: >60 mL/min (ref >60)
GLUCOSE: 88 mg/dL (ref 65–99)
Potassium: 3.9 mmol/L (ref 3.5–5.1)
Sodium: 138 mmol/L (ref 135–145)

## 2016-01-11 LAB — CBC
HCT: 38.7 % (ref 36.0–46.0)
HEMOGLOBIN: 12.6 g/dL (ref 12.0–15.0)
MCH: 29.7 pg (ref 26.0–34.0)
MCHC: 32.6 g/dL (ref 30.0–36.0)
MCV: 91.3 fL (ref 78.0–100.0)
Platelets: 202 10*3/uL (ref 150–400)
RBC: 4.24 MIL/uL (ref 3.87–5.11)
RDW: 14 % (ref 11.5–15.5)
WBC: 6 10*3/uL (ref 4.0–10.5)

## 2016-01-11 NOTE — ED Provider Notes (Signed)
CSN: [id number]     Arrival date & time 01/11/16  1827 History   First MD Initiated Contact with Patient 01/11/16 2215     Chief Complaint  Patient presents with  . Chest Pain     (Consider location/radiation/quality/duration/timing/severity/associated sxs/prior Treatment) HPI   Patient with PMH of hyperlipidemia, osteopenia, Vit D deficiency, hemochromatosis, and arthritis comes to the ER for evaluation of CP. She was seen at her [facility] doctors [facility] today for an episode of CP she had 1 week ago. She describes having CP for the past month that has been intermittent. One week ago she had pressure that lasted longer than 2 minutes. She has had CP with exertion with associated SOB. Patient seen here for further workup because of her substernal chest pain. She denies currently having any pain and is resting comfortably.  The patient is not very concerned with her pain as she says she does the treadmill and works out hard at [employer] and does not get any angina. She does notice that stairs do cause her to fatigue earlier.   Past Medical History  Diagnosis Date  . Hyperlipidemia   . Osteopenia   . Vitamin D deficiency   . Hemochromatosis   . Arthritis    Past Surgical History  Procedure Laterality Date  . Breast biopsy Left   . Colonoscopy     Family History  Problem Relation Age of Onset  . CAD Mother   . Kidney failure Father   . CVA Father   . Hemochromatosis Sister   . Hemochromatosis Brother   . Osteoporosis Mother   . Osteoporosis Father    Social History  Substance Use Topics  . Smoking status: Never Smoker   . Smokeless tobacco: Never Used  . Alcohol Use: Yes   OB History    No data available     Review of Systems  Review of Systems All other systems negative except as documented in the HPI. All pertinent positives and negatives as reviewed in the HPI.   Allergies  Ambien; Codeine; Sulfa antibiotics; Tetanus toxoids; Excedrin pm; and  Penicillins  Home Medications   Prior to Admission medications   Medication Sig Start Date End Date Taking? Authorizing Provider  atorvastatin (LIPITOR) 10 MG tablet Take 10 mg by mouth every evening.    Yes Historical Provider, MD  Cholecalciferol 2000 UNITS TBDP Take 6,000 Units by mouth 2 (two) times daily.    Yes Historical Provider, MD  ibandronate (BONIVA) 150 MG tablet Take 1 tablet (150 mg total) by mouth every 30 (thirty) days. Take in the morning with a full glass of water, on an empty stomach, and do not take anything else by mouth or lie down for the next 30 min. 01/10/15  Yes Romero BellingSean Ellison, MD  Omega-3 Fatty Acids (FISH OIL) 1200 MG CAPS Take 1,200 mg by mouth daily.    Yes Historical Provider, MD  traZODone (DESYREL) 50 MG tablet Take 50 mg by mouth at bedtime as needed for sleep.    Yes Historical Provider, MD  trimethoprim (TRIMPEX) 100 MG tablet Take 100 mg by mouth every morning.   Yes Historical Provider, MD  omeprazole (PRILOSEC) 20 MG capsule Take 1 capsule (20 mg total) by mouth daily. 01/12/16   Emy Angevine Neva SeatGreene, PA-C   BP 128/79 mmHg  Pulse 64  Temp(Src) 97.9 F (36.6 C) (Oral)  Resp 13  SpO2 99% Physical Exam  Constitutional: She appears well-developed and well-nourished. No distress.  HENT:  Head: Normocephalic and atraumatic.  Right Ear: Tympanic membrane and ear canal normal.  Left Ear: Tympanic membrane and ear canal normal.  Nose: Nose normal.  Mouth/Throat: Uvula is midline, oropharynx is clear and moist and mucous membranes are normal.  Eyes: Pupils are equal, round, and reactive to light.  Neck: Normal range of motion. Neck supple.  Cardiovascular: Normal rate and regular rhythm.   Pulmonary/Chest: Effort normal.  Abdominal: Soft.  No signs of abdominal distention  Musculoskeletal:  No LE swelling  Neurological: She is alert.  Acting at baseline  Skin: Skin is warm and dry. No rash noted.  Nursing note and vitals reviewed.   ED Course  Procedures  (including critical care time) Labs Review Labs Reviewed  BASIC METABOLIC PANEL - Abnormal; Notable for the following:    Calcium 10.4 (*)    All other components within normal limits  CBC  TROPONIN I  I-STAT TROPOININ, ED    Imaging Review Dg Chest 2 View  01/11/2016  CLINICAL DATA:  Chest pressure and intermittent shortness of breath today. Initial encounter. EXAM: CHEST  2 VIEW COMPARISON:  PA and lateral chest 10/09/2008. FINDINGS: The lungs are clear. Heart size is normal. No pneumothorax or pleural effusion. No focal bony abnormality. IMPRESSION: No acute disease. Electronically Signed   By: Drusilla Kanner M.D.   On: 01/11/2016 19:11   I have personally reviewed and evaluated these images and lab results as part of my medical decision-making.   EKG Interpretation   Date/Time:  Thursday January 11 2016 18:38:36 EDT Ventricular Rate:  74 PR Interval:  122 QRS Duration: 68 QT Interval:  396 QTC Calculation: 439 R Axis:   76 Text Interpretation:  Normal sinus rhythm Nonspecific ST abnormality  Abnormal ECG Confirmed by MESNER MD, Barbara Cower [phone]) on 01/11/2016 9:36:23 PM      MDM   Final diagnoses:  Chest pain, unspecified chest pain type    Discussed case with Dr. Karma Ganja, patient has a HEART score of 3- due to age and 1 risk factor. She also prefers to go home and follow-up with a cardiologist as an outpatient, therefore will delta troponin here and observe.  Negative Delta trop, normal EKG, BMP, CBC, and normal chest xray.  Patient is to be discharged with recommendation to follow up with PCP in regards to today's hospital visit. Chest pain is not likely of cardiac or pulmonary etiology d/t presentation, perc negative, VSS, no tracheal deviation, no JVD or new murmur, RRR, breath sounds equal bilaterally, EKG without acute abnormalities, negative troponin, and negative CXR. Pt has been advised start a PPI and return to the ED is CP becomes exertional, associated with  diaphoresis or nausea, radiates to left jaw/arm, worsens or becomes concerning in any way. Pt appears reliable for follow up and is agreeable to discharge.   Case has been discussed with and seen by Dr. Karma Ganja who agrees with the above plan to discharge.     Marlon Pel, PA-C 01/12/16 0019  Jerelyn Scott, MD 01/12/16 [phone]

## 2016-01-11 NOTE — ED Notes (Signed)
Pt c/o chest pressure x1 month. Pt states a week ago, chest pressure lasted longer than 2 minutes. [facility]. Pt has CP and SOB with exertion. Pt had normal EKG done and troponin level (not resulted). Pt seen here for further workup. Substernal chest.

## 2016-01-11 NOTE — ED Notes (Signed)
Patient is resting comfortably, denies any pain.

## 2016-01-11 NOTE — ED Notes (Addendum)
Pt PCP called to notify that pt had troponin drawn earlier today in office that was negative.

## 2016-01-12 LAB — TROPONIN I: Troponin I: <0.03 ng/mL (ref <0.031)

## 2016-01-12 MED ORDER — OMEPRAZOLE 20 MG PO CPDR: 20.0000 mg | DELAYED_RELEASE_CAPSULE | Freq: Every day | ORAL | Status: DC

## 2016-01-12 MED ORDER — PANTOPRAZOLE SODIUM 40 MG PO TBEC
40.0000 mg | DELAYED_RELEASE_TABLET | Freq: Every day | ORAL | Status: DC
  Administered 2016-01-12: 40 mg via ORAL
  Filled 2016-01-12: qty 1

## 2016-01-12 NOTE — Discharge Instructions (Signed)
Nonspecific Chest Pain  °Chest pain can be caused by many different conditions. There is always a chance that your pain could be related to something serious, such as a heart attack or a blood clot in your lungs. Chest pain can also be caused by conditions that are not life-threatening. If you have chest pain, it is very important to follow up with your health care provider. °CAUSES  °Chest pain can be caused by: °· Heartburn. °· Pneumonia or bronchitis. °· Anxiety or stress. °· Inflammation around your heart (pericarditis) or lung (pleuritis or pleurisy). °· A blood clot in your lung. °· A collapsed lung (pneumothorax). It can develop suddenly on its own (spontaneous pneumothorax) or from trauma to the chest. °· Shingles infection (varicella-zoster virus). °· Heart attack. °· Damage to the bones, muscles, and cartilage that make up your chest wall. This can include: °¨ Bruised bones due to injury. °¨ Strained muscles or cartilage due to frequent or repeated coughing or overwork. °¨ Fracture to one or more ribs. °¨ Sore cartilage due to inflammation (costochondritis). °RISK FACTORS  °Risk factors for chest pain may include: °· Activities that increase your risk for trauma or injury to your chest. °· Respiratory infections or conditions that cause frequent coughing. °· Medical conditions or overeating that can cause heartburn. °· Heart disease or family history of heart disease. °· Conditions or health behaviors that increase your risk of developing a blood clot. °· Having had chicken pox (varicella zoster). °SIGNS AND SYMPTOMS °Chest pain can feel like: °· Burning or tingling on the surface of your chest or deep in your chest. °· Crushing, pressure, aching, or squeezing pain. °· Dull or sharp pain that is worse when you move, cough, or take a deep breath. °· Pain that is also felt in your back, neck, shoulder, or arm, or pain that spreads to any of these areas. °Your chest pain may come and go, or it may stay  constant. °DIAGNOSIS °Lab tests or other studies may be needed to find the cause of your pain. Your health care provider may have you take a test called an ambulatory ECG (electrocardiogram). An ECG records your heartbeat patterns at the time the test is performed. You may also have other tests, such as: °· Transthoracic echocardiogram (TTE). During echocardiography, sound waves are used to create a picture of all of the heart structures and to look at how blood flows through your heart. °· Transesophageal echocardiogram (TEE). This is a more advanced imaging test that obtains images from inside your body. It allows your health care provider to see your heart in finer detail. °· Cardiac monitoring. This allows your health care provider to monitor your heart rate and rhythm in real time. °· Holter monitor. This is a portable device that records your heartbeat and can help to diagnose abnormal heartbeats. It allows your health care provider to track your heart activity for several days, if needed. °· Stress tests. These can be done through exercise or by taking medicine that makes your heart beat more quickly. °· Blood tests. °· Imaging tests. °TREATMENT  °Your treatment depends on what is causing your chest pain. Treatment may include: °· Medicines. These may include: °¨ Acid blockers for heartburn. °¨ Anti-inflammatory medicine. °¨ Pain medicine for inflammatory conditions. °¨ Antibiotic medicine, if an infection is present. °¨ Medicines to dissolve blood clots. °¨ Medicines to treat coronary artery disease. °· Supportive care for conditions that do not require medicines. This may include: °¨ Resting. °¨ Applying heat   or cold packs to injured areas. °¨ Limiting activities until pain decreases. °HOME CARE INSTRUCTIONS °· If you were prescribed an antibiotic medicine, finish it all even if you start to feel better. °· Avoid any activities that bring on chest pain. °· Do not use any tobacco products, including  cigarettes, chewing tobacco, or electronic cigarettes. If you need help quitting, ask your health care provider. °· Do not drink alcohol. °· Take medicines only as directed by your health care provider. °· Keep all follow-up visits as directed by your health care provider. This is important. This includes any further testing if your chest pain does not go away. °· If heartburn is the cause for your chest pain, you may be told to keep your head raised (elevated) while sleeping. This reduces the chance that acid will go from your stomach into your esophagus. °· Make lifestyle changes as directed by your health care provider. These may include: °¨ Getting regular exercise. Ask your health care provider to suggest some activities that are safe for you. °¨ Eating a heart-healthy diet. A registered dietitian can help you to learn healthy eating options. °¨ Maintaining a healthy weight. °¨ Managing diabetes, if necessary. °¨ Reducing stress. °SEEK MEDICAL CARE IF: °· Your chest pain does not go away after treatment. °· You have a rash with blisters on your chest. °· You have a fever. °SEEK IMMEDIATE MEDICAL CARE IF:  °· Your chest pain is worse. °· You have an increasing cough, or you cough up blood. °· You have severe abdominal pain. °· You have severe weakness. °· You faint. °· You have chills. °· You have sudden, unexplained chest discomfort. °· You have sudden, unexplained discomfort in your arms, back, neck, or jaw. °· You have shortness of breath at any time. °· You suddenly start to sweat, or your skin gets clammy. °· You feel nauseous or you vomit. °· You suddenly feel light-headed or dizzy. °· Your heart begins to beat quickly, or it feels like it is skipping beats. °These symptoms may represent a serious problem that is an emergency. Do not wait to see if the symptoms will go away. Get medical help right away. Call your local emergency services (911 in the [place] Do not drive yourself to the hospital. °  °This  information is not intended to replace advice given to you by your health care provider. Make sure you discuss any questions you have with your health care provider. °  °Document Released: 05/08/2005 Document Revised: 08/19/2014 Document Reviewed: 03/04/2014 °Elsevier Interactive Patient Education ©2016 [employer] ° °

## 2016-01-18 ENCOUNTER — Telehealth: Payer: Self-pay | Admitting: Cardiovascular Disease

## 2016-01-18 NOTE — Telephone Encounter (Signed)
[facility] on 01/23/16 with Dr Tresa EndoKelly.  [facility] for Dr Landry DykeKelly's schedule on 01/23/16. lp

## 2016-01-18 NOTE — Telephone Encounter (Deleted)
[facility] for appointment with Dr Tresa EndoKelly 01/23/16.

## 2016-01-23 ENCOUNTER — Encounter: Payer: Self-pay | Admitting: Cardiovascular Disease

## 2016-01-23 ENCOUNTER — Ambulatory Visit (INDEPENDENT_AMBULATORY_CARE_PROVIDER_SITE_OTHER): Payer: Medicare Other | Admitting: Cardiovascular Disease

## 2016-01-23 VITALS — BP 112/76 | HR 58 | Ht 62.5 in | Wt 123.4 lb

## 2016-01-23 DIAGNOSIS — E785 Hyperlipidemia, unspecified: Secondary | ICD-10-CM

## 2016-01-23 DIAGNOSIS — R079 Chest pain, unspecified: Secondary | ICD-10-CM

## 2016-01-23 DIAGNOSIS — R0789 Other chest pain: Secondary | ICD-10-CM

## 2016-01-23 NOTE — Patient Instructions (Signed)
Your physician has requested that you have an echocardiogram. Echocardiography is a painless test that uses sound waves to create images of your heart. It provides your doctor with information about the size and shape of your heart and how well your heart's chambers and valves are working. This procedure takes approximately one hour. There are no restrictions for this procedure.  Your physician has requested that you have an exercise tolerance test. For further information please visit [email or web address]. Please also follow instruction sheet, as given.   Your physician recommends that you schedule a follow-up appointment pending resutls.

## 2016-01-26 ENCOUNTER — Telehealth (HOSPITAL_COMMUNITY): Payer: Self-pay

## 2016-01-26 NOTE — Telephone Encounter (Signed)
Encounter complete. 

## 2016-01-29 ENCOUNTER — Encounter: Payer: Self-pay | Admitting: Cardiovascular Disease

## 2016-01-29 DIAGNOSIS — E785 Hyperlipidemia, unspecified: Secondary | ICD-10-CM | POA: Insufficient documentation

## 2016-01-29 DIAGNOSIS — R0789 Other chest pain: Secondary | ICD-10-CM | POA: Insufficient documentation

## 2016-01-29 NOTE — Progress Notes (Signed)
Patient ID: Tiffany Reyes, female   DOB: [birth year], 65 y.o.   MRN: [id number]     Primary MD: Dr. Leighton Ruff  PATIENT PROFILE: Tiffany Reyes is a 65 y.o. female who is referred through the courtesy of Dr. Leighton Ruff for evaluation of chest discomfort.   HPI:  Tiffany Reyes has a history of hyperlipidemia, and has a significant family history for interstitial cystitis.  She has experienced episodes of chest pain in the past and underwent a treadmill test approximately 5 years ago which reportedly was normal.  In May, she began to experience episodes of somewhat nonexertional chest discomfort.  Typically, these were not related to exercise but more often related to periods of increased stress.  These episodes of chest pain have occurred more frequently and has lasted longer.  She does go to the gym and is able to walk on a treadmill without significant precipitation of this discomfort.  She recently saw Dr. Drema Dallas and at hat time also admitted to one episode of chest pressure associated with shortness of breath after exertion.  A troponin level was drawn and this was normal.  She is now referred for cardiology evaluation.  Note, the patient had also recently been seen at urgent care.  Reported left hand turning black.  Patient had taken aspirin the day before she was felt to have a contusion.  Past Medical History  Diagnosis Date  . Hyperlipidemia   . Osteopenia   . Vitamin D deficiency   . Hemochromatosis   . Arthritis     Past Surgical History  Procedure Laterality Date  . Breast biopsy Left   . Colonoscopy      Allergies  Allergen Reactions  . Ambien [Zolpidem]   . Codeine Nausea Only  . Fluconazole Hives    Veins stood out  . Sulfa Antibiotics Other (See Comments)    Unknown reaction  . Tetanus Toxoids Swelling    August 1976 last tetanus expierenced intense fever. Swelling in arm.  . Excedrin Pm [Diphenhydramine-Apap (Sleep)]   . Penicillins Rash    Has  patient had a PCN reaction causing immediate rash, facial/tongue/throat swelling, SOB or lightheadedness with hypotension: Yes Has patient had a PCN reaction causing severe rash involving mucus membranes or skin necrosis: No Has patient had a PCN reaction that required hospitalization No Has patient had a PCN reaction occurring within the last 10 years: No If all of the above answers are "NO", then may proceed with Cephalosporin use.     Current Outpatient Prescriptions  Medication Sig Dispense Refill  . atorvastatin (LIPITOR) 10 MG tablet Take 10 mg by mouth every evening.     . Cholecalciferol 2000 UNITS TBDP Take 1 tablet by mouth 2 (two) times daily.     Marland Kitchen ibandronate (BONIVA) 150 MG tablet Take 1 tablet (150 mg total) by mouth every 30 (thirty) days. Take in the morning with a full glass of water, on an empty stomach, and do not take anything else by mouth or lie down for the next 30 min. 3 tablet 3  . Omega-3 Fatty Acids (FISH OIL) 1200 MG CAPS Take 1,200 mg by mouth 2 (two) times daily.     Marland Kitchen omeprazole (PRILOSEC) 20 MG capsule Take 1 capsule (20 mg total) by mouth daily. 30 capsule 0  . trimethoprim (TRIMPEX) 100 MG tablet Take 100 mg by mouth every morning.    . traZODone (DESYREL) 50 MG tablet Take 50 mg by mouth at bedtime as  needed for sleep. Reported on 01/23/2016     No current facility-administered medications for this visit.    Social History   Social History  . Marital Status: Married    Spouse Name: N/A  . Number of Children: N/A  . Years of Education: N/A   Occupational History  . Not on file.   Social History Main Topics  . Smoking status: Never Smoker   . Smokeless tobacco: Never Used  . Alcohol Use: Yes  . Drug Use: No  . Sexual Activity: Not on file   Other Topics Concern  . Not on file   Social History Narrative   Additional social history is notable in that she is married for 30 years.  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  She  retired in 2005.  There is no tobacco use.  She drinks occasional wine.  She goes to the gym regularly and typically 3-5 days per week doing treadmill, yoga, and Pilates.  Family History  Problem Relation Age of Onset  . CAD Mother   . Kidney failure Father   . CVA Father   . Hemochromatosis Sister   . Hemochromatosis Brother   . Osteoporosis Mother   . Osteoporosis Father    Family history is notable that her mother is 12 and has hypertension and hyperlipidemia and had open heart surgery at age 61.  Father died at age 13 from kidney failure and had previously suffered 3 strokes.  She has 1 brother who has hypertension and 3 sisters, one who has hyperlipidemia.  ROS General: Negative; No fevers, chills, or night sweats HEENT: Negative; No changes in vision or hearing, sinus congestion, difficulty swallowing Pulmonary: Negative; No cough, wheezing, shortness of breath, hemoptysis Cardiovascular:  See HPI; No chest pain, presyncope, syncope, palpitations, edema GI: Negative; No nausea, vomiting, diarrhea, or abdominal pain GU: Positive for a variant of interstitial cystitis.  She sees Dr. Amalia Hailey. Musculoskeletal: Negative; no myalgias, joint pain, or weakness Hematologic/Oncologic: Negative; no easy bruising, bleeding Endocrine: Positive for history of mild hypercalcemia and a previously been evaluated for hyperparathyroidism.  Negative; no heat/cold intolerance; no diabetes Neuro: Negative; no changes in balance, headaches Skin: Negative; No rashes or skin lesions Psychiatric: Negative; No behavioral problems, depression Sleep: Positive for insomnia; no bruxism, restless legs, hypnogagnic hallucinations Other comprehensive 14 point system review is negative   Physical Exam BP 112/76 mmHg  Pulse 58  Ht 5' 2.5" (1.588 m)  Wt 123 lb 6 oz (55.963 kg)  BMI 22.19 kg/m2  Wt Readings from Last 3 Encounters:  01/23/16 123 lb 6 oz (55.963 kg)  12/30/15 123 lb (55.792 kg)  03/21/15 122 lb  (55.339 kg)   General: Alert, oriented, no distress.  Skin: normal turgor, no rashes, warm and dry HEENT: Normocephalic, atraumatic. Pupils equal round and reactive to light; sclera anicteric; extraocular muscles intact; Fundi no hemorrhages or exudates Nose without nasal septal hypertrophy Mouth/Parynx benign; Mallinpatti scale 3 Neck: No JVD, no carotid bruits; normal carotid upstroke Lungs: clear to ausculatation and percussion; no wheezing or rales Chest wall: without tenderness to palpitation Heart: PMI not displaced, RRR, s1 s2 normal, 1/6 systolic murmur, no diastolic murmur, no rubs, gallops, thrills, or heaves Abdomen: soft, nontender; no hepatosplenomehaly, BS+; abdominal aorta nontender and not dilated by palpation. Back: no CVA tenderness Pulses 2+ Musculoskeletal: full range of motion, normal strength, no joint deformities Extremities: no clubbing cyanosis or edema, Homan's sign negative  Neurologic: grossly nonfocal; Cranial nerves grossly wnl Psychologic: Normal  mood and affect   ECG (independently read by me): Bradycardia with mild sinus arrhythmia, heart rate 59 bpm.  Possible low atrial rhythm with small negatively inverted P wave in lead 3.  LABS: Recent blood work from 12/15/2015 by Dr. Leighton Ruff was reviewed.  BMP Latest Ref Rng 01/11/2016 03/21/2015 10/31/2014  Glucose 65 - 99 mg/dL 88 - -  BUN 6 - 20 mg/dL 12 - -  Creatinine 0.44 - 1.00 mg/dL 0.77 - -  Sodium 135 - 145 mmol/L 138 - -  Potassium 3.5 - 5.1 mmol/L 3.9 - -  Chloride 101 - 111 mmol/L 105 - -  CO2 22 - 32 mmol/L 26 - -  Calcium 8.9 - 10.3 mg/dL 10.4(H) 10.2 10.3     Hepatic Function Latest Ref Rng 10/09/2008  Total Protein 6.0 - 8.3 g/dL 6.3  Albumin 3.5 - 5.2 g/dL 3.9  AST 0 - 37 U/L 28  ALT 0 - 35 U/L 22  Alk Phosphatase 39 - 117 U/L 80  Total Bilirubin 0.3 - 1.2 mg/dL 0.7    CBC Latest Ref Rng 01/11/2016 12/30/2015 10/09/2008  WBC 4.0 - 10.5 K/uL 6.0 8.1 4.3  Hemoglobin 12.0 - 15.0  g/dL 12.6 13.8 12.3  Hematocrit 36.0 - 46.0 % 38.7 39.1 35.1(L)  Platelets 150 - 400 K/uL 202 - 141(L)   Lab Results  Component Value Date   MCV 91.3 01/11/2016   MCV 89.5 12/30/2015   MCV 94.5 10/09/2008   Lab Results  Component Value Date   TSH 2.28 10/31/2014   No results found for: HGBA1C   BNP No results found for: BNP  ProBNP No results found for: PROBNP   Lipid Panel  No results found for: CHOL, TRIG, HDL, CHOLHDL, VLDL, LDLCALC, LDLDIRECT  RADIOLOGY: Dg Chest 2 View  01/11/2016  CLINICAL DATA:  Chest pressure and intermittent shortness of breath today. Initial encounter. EXAM: CHEST  2 VIEW COMPARISON:  PA and lateral chest 10/09/2008. FINDINGS: The lungs are clear. Heart size is normal. No pneumothorax or pleural effusion. No focal bony abnormality. IMPRESSION: No acute disease. Electronically Signed   By: Inge Rise M.D.   On: 01/11/2016 19:11     ASSESSMENT AND PLAN: Tiffany Reyes is a 65 year old female who has a history of hyperlipidemia and has been on lipid lowering therapy for many years.  Most recently she has been on atorvastatin 10 mg. her most recent laboratory on 12/15/2015 at Dr. Drema Dallas office revealed a LDL cholesterol at 120.  She has a history of somewhat atypical chest pain in 2012 had a normal treadmill test.  Recently, she has experienced stress mediated chest discomfort.  She believes that most of the time.  This is not exertionally precipitated nor is associated with shortness of breath.  However, there was one occurrence where she did experience this after exercise and was more short of breath.  She goes to the gym regularly and walks on a treadmill typically without difficulty.  I suspect her chest pain is most likely non-cardiac in etiology, but with her recurrent symptomatology, I am recommending that she undergo a follow-up routine graded exercise treadmill study.  I will also schedule her for an echo Doppler study to evaluate both  systolic and diastolic function as well as valvular architecture.  I reviewed her records from Dr. Drema Dallas office.  After a prolonged episode of chest pain her troponin was negative.  There were no diagnostic ECG changes.  I also reviewed her emergency room records; ostium  was mildly elevated at 10.4.  I will contact her regarding her test results.  If they are entirely normal she will return to the primary care of Dr. Drema Dallas.  If significant abnormalities are detected,  I will see her back in the office for follow-up evaluation.   Troy Sine, MD, [facility] 01/29/2016 6:53 PM

## 2016-01-31 ENCOUNTER — Ambulatory Visit (HOSPITAL_COMMUNITY)
Admission: RE | Admit: 2016-01-31 | Discharge: 2016-01-31 | Disposition: A | Discharge status: Home / Self Care | Payer: Medicare Other | Source: Ambulatory Visit | Attending: Cardiovascular Disease | Admitting: Cardiovascular Disease

## 2016-01-31 DIAGNOSIS — R079 Chest pain, unspecified: Secondary | ICD-10-CM | POA: Diagnosis not present

## 2016-01-31 DIAGNOSIS — R9439 Abnormal result of other cardiovascular function study: Secondary | ICD-10-CM | POA: Diagnosis not present

## 2016-02-01 LAB — EXERCISE TOLERANCE TEST
CSEPEW: 13.4 METS
CSEPHR: 100 %
CSEPPHR: 155 {beats}/min
Exercise duration (min): 12 min
MPHR: 154 {beats}/min
RPE: 16
Rest HR: 70 {beats}/min

## 2016-02-07 ENCOUNTER — Telehealth: Payer: Self-pay | Admitting: *Deleted

## 2016-02-07 ENCOUNTER — Telehealth: Payer: Self-pay | Admitting: Cardiovascular Disease

## 2016-02-07 ENCOUNTER — Encounter: Payer: Self-pay | Admitting: Cardiovascular Disease

## 2016-02-07 ENCOUNTER — Other Ambulatory Visit: Payer: Self-pay | Admitting: *Deleted

## 2016-02-07 DIAGNOSIS — R079 Chest pain, unspecified: Secondary | ICD-10-CM

## 2016-02-07 DIAGNOSIS — R9430 Abnormal result of cardiovascular function study, unspecified: Secondary | ICD-10-CM

## 2016-02-07 NOTE — Telephone Encounter (Signed)
Opened in error

## 2016-02-07 NOTE — Telephone Encounter (Signed)
Patient notified of GXT results and recommendations. All questions were answered to patient's satisfaction.

## 2016-02-07 NOTE — Telephone Encounter (Signed)
Pt calling c/o needing additional testing after abnormal stress last week-pt wants to know if she can continue with normal activity -she walks an hour-an hour of cardio and some weights -pls call

## 2016-02-07 NOTE — Telephone Encounter (Signed)
Advice given to patient, who communicated understanding and thanks.

## 2016-02-07 NOTE — Telephone Encounter (Signed)
-----   Message from Lennette Biharihomas A Kelly, MD sent at 02/04/2016  2:15 PM EDT ----- 1 mm ST depression during stress and into recovery.  Schedule for exercise myoview study for further evaluation.

## 2016-02-07 NOTE — Telephone Encounter (Signed)
Can continue normal activity but don't significantly push the limits until nuclear study is done.

## 2016-02-07 NOTE — Telephone Encounter (Signed)
Please advise on any restrictions for this patient related to recent stress test.

## 2016-02-09 ENCOUNTER — Other Ambulatory Visit: Payer: Self-pay

## 2016-02-09 ENCOUNTER — Ambulatory Visit (HOSPITAL_COMMUNITY): Payer: Medicare Other | Attending: Cardiology

## 2016-02-09 DIAGNOSIS — I340 Nonrheumatic mitral (valve) insufficiency: Secondary | ICD-10-CM | POA: Diagnosis not present

## 2016-02-09 DIAGNOSIS — R079 Chest pain, unspecified: Secondary | ICD-10-CM | POA: Insufficient documentation

## 2016-02-09 DIAGNOSIS — E785 Hyperlipidemia, unspecified: Secondary | ICD-10-CM | POA: Insufficient documentation

## 2016-02-09 DIAGNOSIS — I5189 Other ill-defined heart diseases: Secondary | ICD-10-CM | POA: Diagnosis not present

## 2016-02-09 DIAGNOSIS — Z8249 Family history of ischemic heart disease and other diseases of the circulatory system: Secondary | ICD-10-CM | POA: Insufficient documentation

## 2016-02-09 DIAGNOSIS — I071 Rheumatic tricuspid insufficiency: Secondary | ICD-10-CM | POA: Diagnosis not present

## 2016-02-10 HISTORY — PX: NM MYOVIEW LTD: HXRAD82

## 2016-02-15 ENCOUNTER — Telehealth (HOSPITAL_COMMUNITY): Payer: Self-pay

## 2016-02-15 NOTE — Telephone Encounter (Signed)
Encounter complete. 

## 2016-02-20 ENCOUNTER — Ambulatory Visit (HOSPITAL_COMMUNITY)
Admission: RE | Admit: 2016-02-20 | Discharge: 2016-02-20 | Disposition: A | Discharge status: Home / Self Care | Payer: Medicare Other | Source: Ambulatory Visit | Attending: Cardiovascular Disease | Admitting: Cardiovascular Disease

## 2016-02-20 DIAGNOSIS — R079 Chest pain, unspecified: Secondary | ICD-10-CM | POA: Insufficient documentation

## 2016-02-20 DIAGNOSIS — R9430 Abnormal result of cardiovascular function study, unspecified: Secondary | ICD-10-CM

## 2016-02-20 DIAGNOSIS — R0609 Other forms of dyspnea: Secondary | ICD-10-CM | POA: Insufficient documentation

## 2016-02-20 DIAGNOSIS — Z8249 Family history of ischemic heart disease and other diseases of the circulatory system: Secondary | ICD-10-CM | POA: Diagnosis not present

## 2016-02-20 DIAGNOSIS — R42 Dizziness and giddiness: Secondary | ICD-10-CM | POA: Insufficient documentation

## 2016-02-20 MED ORDER — TECHNETIUM TC 99M TETROFOSMIN IV KIT
30.2000 | PACK | Freq: Once | INTRAVENOUS | Status: AC | PRN
  Administered 2016-02-20: 30.2 via INTRAVENOUS
  Filled 2016-02-20: qty 30

## 2016-02-20 MED ORDER — TECHNETIUM TC 99M TETROFOSMIN IV KIT
10.2000 | PACK | Freq: Once | INTRAVENOUS | Status: AC | PRN
  Administered 2016-02-20: 10.2 via INTRAVENOUS
  Filled 2016-02-20: qty 10

## 2016-02-21 LAB — MYOCARDIAL PERFUSION IMAGING
CHL CUP MPHR: 154 {beats}/min
CHL CUP RESTING HR STRESS: 56 {beats}/min
CHL RATE OF PERCEIVED EXERTION: 16
CSEPEDS: 31 s
CSEPEW: 13.4 METS
CSEPPHR: 144 {beats}/min
Exercise duration (min): 11 min
LVDIAVOL: 56 mL (ref 46–106)
LVSYSVOL: 17 mL
NUC STRESS TID: 0.97
Percent HR: 93 %
SDS: 1
SRS: 0
SSS: 1

## 2016-03-01 ENCOUNTER — Telehealth: Payer: Self-pay | Admitting: Cardiovascular Disease

## 2016-03-01 NOTE — Telephone Encounter (Signed)
Mrs. Tiffany Reyes is calling about her test results . Please call   Thanks

## 2016-03-01 NOTE — Telephone Encounter (Signed)
Called patient back about her NM Stress Test results. Informed patient that Dr. Tresa EndoKelly needs to review her test, and then his nurse will call her back with results. Patient verbalized understanding.

## 2016-03-06 ENCOUNTER — Telehealth: Payer: Self-pay | Admitting: Cardiovascular Disease

## 2016-03-06 NOTE — Telephone Encounter (Signed)
Patient called in. Was having a bad day. Wanted to know her stress test results. Results provided to patient. No further action needed at this time.

## 2016-03-06 NOTE — Telephone Encounter (Signed)
New message       The pt has called her primary care physician and asked the primary office to call us to give her lab results and the per office the pt states she can not get through today

## 2016-03-25 ENCOUNTER — Ambulatory Visit: Payer: PRIVATE HEALTH INSURANCE | Admitting: Endocrinology

## 2016-04-24 ENCOUNTER — Ambulatory Visit: Payer: Medicare Other | Admitting: Endocrinology

## 2016-05-02 ENCOUNTER — Encounter: Payer: Self-pay | Admitting: Endocrinology

## 2016-05-02 ENCOUNTER — Ambulatory Visit (INDEPENDENT_AMBULATORY_CARE_PROVIDER_SITE_OTHER): Payer: Medicare Other | Admitting: Endocrinology

## 2016-05-02 VITALS — BP 121/73 | HR 67 | Ht 63.0 in | Wt 125.0 lb

## 2016-05-02 DIAGNOSIS — M810 Age-related osteoporosis without current pathological fracture: Secondary | ICD-10-CM

## 2016-05-02 DIAGNOSIS — E559 Vitamin D deficiency, unspecified: Secondary | ICD-10-CM | POA: Diagnosis not present

## 2016-05-02 NOTE — Patient Instructions (Addendum)
blood tests are requested for you today.  We'll let you know about the results.  Let's recheck the bone density.  you will receive a phone call, about a day and time for an appointment.   Please return in 1 year.

## 2016-05-02 NOTE — Progress Notes (Signed)
Subjective:    Patient ID: Tiffany Reyes, female    DOB: 03-14-1951, 65 y.o.   MRN: [id number]  HPI  The state of at least three ongoing medical problems is addressed today, with interval history of each noted here: Pt returns for f/u of primary hyperparathyroidism (she was noted to have osteoporosis in 2016; she did not tolerate fosamax, due to flu-like symptoms; she had a foot fx in 2014 (no other recent fxs); she drinks EtOH approx 10 drinks per week, but no other cause was found; she was first noted to have hypercalcemia in 2012).  She tolerates boniva well.  On boniva, pt states she feels well in general, except for fatigue.  Denies falls.   She takes non-prescription vit-D, 2000 units BID.  She has intermittent leg cramps. Past Medical History:  Diagnosis Date  . Arthritis   . Hemochromatosis   . Hyperlipidemia   . Osteopenia   . Vitamin D deficiency     Past Surgical History:  Procedure Laterality Date  . BREAST BIOPSY Left   . COLONOSCOPY      Social History   Social History  . Marital status: Married    Spouse name: N/A  . Number of children: N/A  . Years of education: N/A   Occupational History  . Not on file.   Social History Main Topics  . Smoking status: Never Smoker  . Smokeless tobacco: Never Used  . Alcohol use Yes  . Drug use: No  . Sexual activity: Not on file   Other Topics Concern  . Not on file   Social History Narrative  . No narrative on file    Current Outpatient Prescriptions on File Prior to Visit  Medication Sig Dispense Refill  . atorvastatin (LIPITOR) 10 MG tablet Take 10 mg by mouth every evening.     . Cholecalciferol 2000 UNITS TBDP Take 1 tablet by mouth 2 (two) times daily.     Marland Kitchen. ibandronate (BONIVA) 150 MG tablet Take 1 tablet (150 mg total) by mouth every 30 (thirty) days. Take in the morning with a full glass of water, on an empty stomach, and do not take anything else by mouth or lie down for the next 30 min. 3 tablet 3    . Omega-3 Fatty Acids (FISH OIL) 1200 MG CAPS Take 1,200 mg by mouth 2 (two) times daily.     Marland Kitchen. omeprazole (PRILOSEC) 20 MG capsule Take 1 capsule (20 mg total) by mouth daily. 30 capsule 0  . traZODone (DESYREL) 50 MG tablet Take 50 mg by mouth at bedtime as needed for sleep. Reported on 01/23/2016    . trimethoprim (TRIMPEX) 100 MG tablet Take 100 mg by mouth every morning.     No current facility-administered medications on file prior to visit.     Allergies  Allergen Reactions  . Ambien [Zolpidem]   . Codeine Nausea Only  . Fluconazole Hives    Veins stood out  . Sulfa Antibiotics Other (See Comments)    Unknown reaction  . Tetanus Toxoids Swelling    August 1976 last tetanus expierenced intense fever. Swelling in arm.  . Trazodone Other (See Comments)    Hangover  . Excedrin Pm [Diphenhydramine-Apap (Sleep)]   . Penicillins Rash    Has patient had a PCN reaction causing immediate rash, facial/tongue/throat swelling, SOB or lightheadedness with hypotension: Yes Has patient had a PCN reaction causing severe rash involving mucus membranes or skin necrosis: No Has patient had a PCN  reaction that required hospitalization No Has patient had a PCN reaction occurring within the last 10 years: No If all of the above answers are "NO", then may proceed with Cephalosporin use.     Family History  Problem Relation Age of Onset  . CAD Mother   . Kidney failure Father   . CVA Father   . Hemochromatosis Sister   . Hemochromatosis Brother   . Osteoporosis Mother   . Osteoporosis Father    BP 121/73   Pulse 67   Ht 5\' 3"  (1.6 m)   Wt 125 lb (56.7 kg)   BMI 22.14 kg/m   Review of Systems Denies heartburn and numbness    Objective:   Physical Exam VITAL SIGNS:  See vs page.  GENERAL: no distress. Gait: normal and steady.   25-OH vit-D=39.     Assessment & Plan:  Osteoporosis, due for recheck Vit-D deficiency: well-replaced Primary hyperparathyroidism, due for  recheck.

## 2016-05-03 LAB — VITAMIN D 25 HYDROXY (VIT D DEFICIENCY, FRACTURES): VITD: 39.28 ng/mL (ref 30.00–100.00)

## 2016-05-06 LAB — PTH, INTACT AND CALCIUM
CALCIUM: 10.6 mg/dL — AB (ref 8.6–10.4)
PTH: 95 pg/mL — ABNORMAL HIGH (ref 14–64)

## 2016-05-27 ENCOUNTER — Telehealth: Payer: Self-pay | Admitting: Endocrinology

## 2016-05-27 NOTE — Telephone Encounter (Signed)
Patient calling on the status of her bone density test.

## 2016-05-28 NOTE — Telephone Encounter (Signed)
I contacted the patient and advised we have scheduled her bone density appointment for 06/11/2016 at 10:30 am. Patient advised this has been scheduled at the [facility] on [address] [place] [address]. Patient was provided with their contact number if she needed to call and reschedule her appointment. Patient voiced understanding and had no further question at this time.

## 2016-06-11 ENCOUNTER — Other Ambulatory Visit: Payer: Medicare Other

## 2016-09-02 ENCOUNTER — Ambulatory Visit (INDEPENDENT_AMBULATORY_CARE_PROVIDER_SITE_OTHER)
Admission: RE | Admit: 2016-09-02 | Discharge: 2016-09-02 | Disposition: A | Discharge status: Home / Self Care | Payer: Medicare Other | Source: Ambulatory Visit | Attending: Endocrinology | Admitting: Endocrinology

## 2016-09-02 DIAGNOSIS — M810 Age-related osteoporosis without current pathological fracture: Secondary | ICD-10-CM

## 2016-09-02 DIAGNOSIS — E559 Vitamin D deficiency, unspecified: Secondary | ICD-10-CM

## 2016-09-12 DIAGNOSIS — E213 Hyperparathyroidism, unspecified: Secondary | ICD-10-CM

## 2016-09-12 HISTORY — DX: Hyperparathyroidism, unspecified: E21.3

## 2017-05-02 ENCOUNTER — Ambulatory Visit: Payer: Medicare Other | Admitting: Endocrinology

## 2017-10-07 ENCOUNTER — Other Ambulatory Visit: Payer: Self-pay

## 2017-10-07 ENCOUNTER — Ambulatory Visit: Payer: Medicare Other | Attending: Family Medicine | Admitting: Physical Therapy

## 2017-10-07 ENCOUNTER — Encounter: Payer: Self-pay | Admitting: Physical Therapy

## 2017-10-07 DIAGNOSIS — M25672 Stiffness of left ankle, not elsewhere classified: Secondary | ICD-10-CM | POA: Insufficient documentation

## 2017-10-07 DIAGNOSIS — M25571 Pain in right ankle and joints of right foot: Secondary | ICD-10-CM | POA: Diagnosis present

## 2017-10-07 DIAGNOSIS — M25572 Pain in left ankle and joints of left foot: Secondary | ICD-10-CM | POA: Diagnosis present

## 2017-10-07 DIAGNOSIS — M25671 Stiffness of right ankle, not elsewhere classified: Secondary | ICD-10-CM | POA: Diagnosis present

## 2017-10-07 NOTE — Therapy (Signed)
[facility] [address] [place], [place], [address] Phone: [phone]   Fax:  [phone]  Physical Therapy Evaluation  Patient Details  Name: Tiffany Reyes MRN: [id number] Date of Birth: 04-Apr-1951 Referring Provider: Dr. Albin FellingAnne Gonazales   Encounter Date: 10/07/2017  Tiffany Reyes End of Session - 10/07/17 1401    Visit Number  1    Number of Visits  12    Date for Tiffany Reyes Re-Evaluation  11/18/17    Authorization Type  [employer] Medicare; $20 copay    Tiffany Reyes Start Time  1316    Tiffany Reyes Stop Time  1359    Tiffany Reyes Time Calculation (min)  43 min    Activity Tolerance  Patient tolerated treatment well    Behavior During Therapy  [facility] for tasks assessed/performed       Past Medical History:  Diagnosis Date  . Arthritis   . Hemochromatosis   . Hyperlipidemia   . Osteopenia   . Vitamin D deficiency     Past Surgical History:  Procedure Laterality Date  . BREAST BIOPSY Left   . COLONOSCOPY      There were no vitals filed for this visit.   Subjective Assessment - 10/07/17 1320    Subjective  Tiffany Reyes is a 67 y/o female who presents to OPPT for bil achilles tendonitis.  Tiffany Reyes reports unknown MOI, and feels pain began in early summer, becoming more noticable in Aug 2018.  Tiffany Reyes now with visible "bump" on Lt achilles.  Tiffany Reyes reports pain is worse at night, especially with driving to and from [place] (mother is there in care facility).    Pertinent History  osteoporosis, Lt foot fx 2015    Patient Stated Goals  "what is it" and any exercises to make pain go away    Currently in Pain?  Yes    Pain Score  1  up to 3/10; at best 0.3/10    Pain Location  Ankle    Pain Orientation  Right;Left    Pain Descriptors / Indicators  Tiring;Throbbing    Pain Type  Chronic pain    Pain Onset  More than a month ago    Pain Frequency  Constant    Aggravating Factors   getting out of bed in AM    Pain Relieving Factors  improves once up walking         [facility] Assessment -  10/07/17 1326      Assessment   Medical Diagnosis  bil achilles tendonitis    Referring Provider  Dr. Albin FellingAnne Gonazales    Onset Date/Surgical Date  -- June 2018    Next MD Visit  1 year    Prior Therapy  multiple times; unrelated to this condition      Precautions   Precautions  None      Restrictions   Weight Bearing Restrictions  No      Balance Screen   Has the patient fallen in the past 6 months  No    Has the patient had a decrease in activity level because of a fear of falling?   No    Is the patient reluctant to leave their home because of a fear of falling?   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 Parkinson's-cognitive deficits)    Leisure  yoga, was exercising regularly but has had difficulty due to assuming caregiver roles      Cognition   Overall Cognitive Status  Within Functional Limits for tasks assessed      Observation/Other Assessments   Focus on Therapeutic Outcomes (FOTO)   incorrect set up      Posture/Postural Control   Posture Comments  no significant deviations noted in stance      ROM / Strength   AROM / PROM / Strength  AROM;Strength;PROM      AROM   AROM Assessment Site  Ankle    Right/Left Ankle  Right;Left    Right Ankle Dorsiflexion  0    Right Ankle Plantar Flexion  56    Right Ankle Inversion  30    Right Ankle Eversion  18    Left Ankle Dorsiflexion  4    Left Ankle Plantar Flexion  62    Left Ankle Inversion  24    Left Ankle Eversion  13      PROM   PROM Assessment Site  Ankle    Right/Left Ankle  Right;Left    Right Ankle Dorsiflexion  3    Left Ankle Dorsiflexion  6      Strength    Overall Strength Comments  bil heel raise: Lt ankle inversion with heel raise    Strength Assessment Site  Ankle    Right/Left Ankle  Right;Left    Right Ankle Dorsiflexion  3/5    Right Ankle Inversion  3/5    Right Ankle Eversion  3/5    Left Ankle Dorsiflexion  3/5    Left Ankle Inversion  4/5    Left Ankle Eversion  4/5      Palpation   Palpation comment  trigger points noted in Rt gastroc/soleus; Lt soleus; tenderness Rt achilles tendon      Ambulation/Gait   Gait Comments  amb WNL             Objective measurements completed on examination: See above findings.              Tiffany Reyes Education - 10/07/17 1401    Education provided  Yes    Education Details  DN, gastroc/soleus stretch    Person(s) Educated  Patient    Methods  Explanation;Demonstration;Handout    Comprehension  Verbalized understanding;Returned demonstration;Need further instruction          Tiffany Reyes Long Term Goals - 10/07/17 1406      Tiffany Reyes LONG TERM GOAL #1   Title  independent with HEP    Status  New    Target Date  11/18/17      Tiffany Reyes LONG TERM GOAL #2   Title  report 50% improvement in morning symptoms for improved function and mobiltiy    Status  New    Target Date  11/18/17      Tiffany Reyes LONG TERM GOAL #3   Title  improve bil ankle dorsiflexion to at least 5 degrees actively for improved function    Status  New    Target Date  11/18/17      Tiffany Reyes LONG TERM GOAL #4   Title  demonstrate 4/5 bil ankle strength for  improved function    Status  New    Target Date  11/18/17             Plan - 10/07/17 1401    Clinical Impression Statement  Tiffany Reyes is a 67 y/o female who presents to OPPT with bil achilles tendonitis.  Tiffany Reyes demonstrates decreased dorsiflexion, decreased strength and active trigger points affecting functional mobility.  Rt side seems to be more affected, which is likely due to Tiffany Reyes driving back and forth to [facility] multiple times a week to care for mother (recently [facility] to [facility]).   Tiffany Reyes also reports taking fluoroquinolones antibiotics late spring 2018, so concern for achilles tendon damage due to this.  Will benefit from Tiffany Reyes to address deficits listed.    History and Personal Factors relevant to plan of care:  osteoporosis, driving to/from [facility] multiple times a week to care for mother, caregiver for husband (Parkinson's, dementia), arthritis    Clinical Presentation  Stable    Clinical Decision Making  Low    Rehab Potential  Good    Tiffany Reyes Frequency  2x / week    Tiffany Reyes Duration  6 weeks    Tiffany Reyes Treatment/Interventions  ADLs/Self Care Home Management;Cryotherapy;Electrical Stimulation;Iontophoresis 4mg /ml Dexamethasone;Gait training;Therapeutic activities;Ultrasound;Therapeutic exercise;Balance training;Patient/family education;Stair training;Neuromuscular re-education;Passive range of motion;Dry needling;Taping;Vasopneumatic Device    Tiffany Reyes Next Visit Plan  DN to gastrocs/soleus, consider pulsed [place] to Rt achilles, manual/modalities PRN; review HEP and progress strengthening    Consulted and Agree with Plan of Care  Patient       Patient will benefit from skilled therapeutic intervention in order to improve the following deficits and impairments:  Pain, Increased muscle spasms, Increased fascial restricitons, Decreased range of motion, Decreased strength, Impaired flexibility  Visit Diagnosis: Pain in left ankle and joints of left foot - Plan: Tiffany Reyes plan of care cert/re-cert  Pain in right ankle and joints of right foot - Plan: Tiffany Reyes plan of care cert/re-cert  Stiffness of left ankle, not elsewhere classified - Plan: Tiffany Reyes plan of care cert/re-cert  Stiffness of right ankle, not elsewhere classified - Plan: Tiffany Reyes plan of care cert/re-cert     Problem List Patient Active Problem List   Diagnosis Date Noted  . Hyperlipidemia 01/29/2016  . Atypical chest pain 01/29/2016  . Vitamin D deficiency 03/21/2015  . Paraproteinemia 11/12/2014  . Hypercalcemia 10/31/2014  . Hyperparathyroidism  (HCC) 10/31/2014  . Osteoporosis 10/31/2014      Tiffany Reyes, Tiffany Reyes, Tiffany Reyes 10/07/17 2:09 PM     [facility] [address] [place], [place], [address] Phone: [phone]   Fax:  [phone]  Name: Tiffany Reyes MRN: [id number] Date of Birth: [birth year]

## 2017-10-07 NOTE — Patient Instructions (Signed)
Gastroc Stretch    Stand with right foot back, leg straight, forward leg bent. Keeping heel on floor, turned slightly out, lean into wall until stretch is felt in calf. Hold __30__ seconds.  Repeat with other leg. Repeat _3___ times per set. Do _1___ sets per session. Do __2-3__ sessions per day.   Soleus Stretch    Stand with right foot back, both knees bent. Keeping heel on floor, turned slightly out, lean into wall until stretch is felt in lower calf. Hold __30__ seconds. Repeat with other leg. Repeat __3__ times per set. Do __1__ sets per session. Do __2-3__ sessions per day.  Trigger Point Dry Needling  . What is Trigger Point Dry Needling (DN)? o DN is a physical therapy technique used to treat muscle pain and dysfunction. Specifically, DN helps deactivate muscle trigger points (muscle knots).  o A thin filiform needle is used to penetrate the skin and stimulate the underlying trigger point. The goal is for a local twitch response (LTR) to occur and for the trigger point to relax. No medication of any kind is injected during the procedure.   . What Does Trigger Point Dry Needling Feel Like?  o The procedure feels different for each individual patient. Some patients report that they do not actually feel the needle enter the skin and overall the process is not painful. Very mild bleeding may occur. However, many patients feel a deep cramping in the muscle in which the needle was inserted. This is the local twitch response.   Marland Kitchen. How Will I feel after the treatment? o Soreness is normal, and the onset of soreness may not occur for a few hours. Typically this soreness does not last longer than two days.  o Bruising is uncommon, however; ice can be used to decrease any possible bruising.  o In rare cases feeling tired or nauseous after the treatment is normal. In addition, your symptoms may get worse before they get better, this period will typically not last longer than 24 hours.    . What Can I do After My Treatment? o Increase your hydration by drinking more water for the next 24 hours. o You may place ice or heat on the areas treated that have become sore, however, do not use heat on inflamed or bruised areas. Heat often brings more relief post needling. o You can continue your regular activities, but vigorous activity is not recommended initially after the treatment for 24 hours. o DN is best combined with other physical therapy such as strengthening, stretching, and other therapies.

## 2017-10-15 ENCOUNTER — Encounter: Payer: Self-pay | Admitting: Physical Therapy

## 2017-10-15 ENCOUNTER — Ambulatory Visit: Payer: Medicare Other | Attending: Family Medicine | Admitting: Physical Therapy

## 2017-10-15 DIAGNOSIS — M25672 Stiffness of left ankle, not elsewhere classified: Secondary | ICD-10-CM | POA: Diagnosis present

## 2017-10-15 DIAGNOSIS — M25571 Pain in right ankle and joints of right foot: Secondary | ICD-10-CM | POA: Diagnosis present

## 2017-10-15 DIAGNOSIS — M25671 Stiffness of right ankle, not elsewhere classified: Secondary | ICD-10-CM | POA: Diagnosis present

## 2017-10-15 DIAGNOSIS — M25572 Pain in left ankle and joints of left foot: Secondary | ICD-10-CM | POA: Diagnosis not present

## 2017-10-15 NOTE — Patient Instructions (Signed)
Avoid waking barefoot

## 2017-10-15 NOTE — Therapy (Signed)
[facility] [address] [place], [place], [address] Phone: [phone]   Fax:  [phone]  Physical Therapy Treatment  Patient Details  Name: Tiffany Reyes MRN: [id number] Date of Birth: 1951-04-28 Referring Provider: Dr. Albin FellingAnne Gonazales   Encounter Date: 10/15/2017  PT End of Session - 10/15/17 1747    Visit Number  2    Number of Visits  12    Date for PT Re-Evaluation  11/18/17    PT Start Time  1632    PT Stop Time  1725    PT Time Calculation (min)  53 min    Activity Tolerance  Patient tolerated treatment well    Behavior During Therapy  [facility] for tasks assessed/performed       Past Medical History:  Diagnosis Date  . Arthritis   . Hemochromatosis   . Hyperlipidemia   . Osteopenia   . Vitamin D deficiency     Past Surgical History:  Procedure Laterality Date  . BREAST BIOPSY Left   . COLONOSCOPY      There were no vitals filed for this visit.  Subjective Assessment - 10/15/17 1639    Subjective  mY BACk is a lttle sore.  I may be doing the exercises wrong.      Currently in Pain?  Yes    Pain Score  1     Pain Location  Ankle    Pain Orientation  Right;Left    Pain Type  Chronic pain    Aggravating Factors   days driving  pain is worse   walking down a long hall,  weather    Pain Relieving Factors  wearing high heel  for short times                      [facility] Adult PT Treatment/Exercise - 10/15/17 0001      Ultrasound   Ultrasound Location  heels    Ultrasound Parameters  50% 1.2 watts /cm2 x 8 minutes each heel    Ultrasound Goals  Pain      Manual Therapy   Manual therapy comments  soft tissue work  foot A/P mobs tibia a/P mobs. manual stretching       Ankle Exercises: Stretches   Soleus Stretch  3 reps;30 seconds    Soleus Stretch Limitations  step and floor tried    Theme park managerGastroc Stretch  3 reps;30 seconds    Gastroc Stretch Limitations  step and floor tried.              PT Education - 10/15/17 1746    Education provided  Yes    Education Details  HEP,  US,  avoid barefoot    Person(s) Educated  Patient    Methods  Explanation;Verbal cues    Comprehension  Verbalized understanding;Returned demonstration          PT Long Term Goals - 10/07/17 1406      PT LONG TERM GOAL #1   Title  independent with HEP    Status  New    Target Date  11/18/17      PT LONG TERM GOAL #2   Title  report 50% improvement in morning symptoms for improved function and mobiltiy    Status  New    Target Date  11/18/17      PT LONG TERM GOAL #3   Title  improve bil ankle dorsiflexion to at least 5 degrees actively for improved function  Status  New    Target Date  11/18/17      PT LONG TERM GOAL #4   Title  demonstrate 4/5 bil ankle strength for improved function    Status  New    Target Date  11/18/17            Plan - 10/15/17 1747    Clinical Impression Statement  Patient able to demo good techniques with exercise after instruction.  No pain at end of session.  Pain was reproduced when patient was prone with hanfing feet off mat,  hamstrings may be tight.    PT Next Visit Plan  DN to gastrocs/soleus, consider pulsed [place] to Rt achilles, manual/modalities PRN; review HEP and progress strengthening,  stretch hamstrings    PT Home Exercise Plan  gastroc/ soleus stretches    Consulted and Agree with Plan of Care  Patient       Patient will benefit from skilled therapeutic intervention in order to improve the following deficits and impairments:     Visit Diagnosis: Pain in left ankle and joints of left foot  Pain in right ankle and joints of right foot  Stiffness of left ankle, not elsewhere classified  Stiffness of right ankle, not elsewhere classified     Problem List Patient Active Problem List   Diagnosis Date Noted  . Hyperlipidemia 01/29/2016  . Atypical chest pain 01/29/2016  . Vitamin D deficiency 03/21/2015  .  Paraproteinemia 11/12/2014  . Hypercalcemia 10/31/2014  . Hyperparathyroidism (HCC) 10/31/2014  . Osteoporosis 10/31/2014    Lavora Brisbon PTA 10/15/2017, 5:50 PM  [facility] [address] [place], [place], [address] Phone: [phone]   Fax:  [phone]  Name: Tiffany Reyes MRN: [id number] Date of Birth: 17-May-1951

## 2017-10-20 ENCOUNTER — Encounter: Payer: Self-pay | Admitting: Physical Therapy

## 2017-10-20 ENCOUNTER — Ambulatory Visit: Payer: Medicare Other | Admitting: Physical Therapy

## 2017-10-20 DIAGNOSIS — M25572 Pain in left ankle and joints of left foot: Secondary | ICD-10-CM | POA: Diagnosis not present

## 2017-10-20 DIAGNOSIS — M25571 Pain in right ankle and joints of right foot: Secondary | ICD-10-CM

## 2017-10-20 DIAGNOSIS — M25672 Stiffness of left ankle, not elsewhere classified: Secondary | ICD-10-CM

## 2017-10-20 DIAGNOSIS — M25671 Stiffness of right ankle, not elsewhere classified: Secondary | ICD-10-CM

## 2017-10-20 NOTE — Therapy (Signed)
[facility] [address] [place], [place], [address] Phone: [phone]   Fax:  [phone]  Physical Therapy Treatment  Patient Details  Name: Tiffany Reyes MRN: [id number] Date of Birth: 1951/01/16 Referring Provider: Dr. Albin FellingAnne Gonazales   Encounter Date: 10/20/2017  PT End of Session - 10/20/17 0932    Visit Number  3    Number of Visits  12    Date for PT Re-Evaluation  11/18/17    Authorization Type  [employer]HC Medicare; $20 copay    PT Start Time  0846    PT Stop Time  0940    PT Time Calculation (min)  54 min    Activity Tolerance  Patient tolerated treatment well    Behavior During Therapy  [facility] for tasks assessed/performed       Past Medical History:  Diagnosis Date  . Arthritis   . Hemochromatosis   . Hyperlipidemia   . Osteopenia   . Vitamin D deficiency     Past Surgical History:  Procedure Laterality Date  . BREAST BIOPSY Left   . COLONOSCOPY      There were no vitals filed for this visit.  Subjective Assessment - 10/20/17 0846    Subjective  "i went home sore after the last session, [place] [facility]. I was on my feet all day except at church."    Currently in Pain?  Yes    Pain Score  2  last night at 3 am was 4/10    Pain Location  Ankle    Pain Orientation  Right;Left    Pain Descriptors / Indicators  -- stiff    Pain Type  Chronic pain    Pain Onset  More than a month ago    Pain Frequency  Constant                      [facility] Adult PT Treatment/Exercise - 10/20/17 0859      Modalities   Modalities  Moist Heat      Moist Heat Therapy   Number Minutes Moist Heat  10 Minutes    Moist Heat Location  -- calves bil in prone      Manual Therapy   Manual Therapy  Soft tissue mobilization    Manual therapy comments  skilled palpation and monitoring during TPDN    Soft tissue mobilization  IASTM along bil calves and achilles tendons, with  active  release techniques with pt performing active DF/PF      Ankle Exercises: Stretches   Soleus Stretch  30 seconds;3 reps slant board    Gastroc Stretch  30 seconds;3 reps;Other (comment) slant board      Ankle Exercises: Seated   Other Seated Ankle Exercises  4-way theraband strengthening 1 x 10 ea direction bil with red theraband provided as HEP       Trigger Point Dry Needling - 10/20/17 0858    Consent Given?  Yes    Education Handout Provided  Yes    Muscles Treated Lower Body  Gastrocnemius;Soleus    Gastrocnemius Response  Twitch response elicited;Palpable increased muscle length R side only along medial head    Soleus Response  --           PT Education - 10/20/17 0901    Education provided  Yes    Education Details  anatomy of the muscles. What TPDN is, benefits of treatment, what to  expect and after care. Updated HEP for ankle strengthening and seated calf stretch    Person(s) Educated  Patient    Methods  Explanation;Verbal cues;Handout    Comprehension  Verbalized understanding;Verbal cues required          PT Long Term Goals - 10/07/17 1406      PT LONG TERM GOAL #1   Title  independent with HEP    Status  New    Target Date  11/18/17      PT LONG TERM GOAL #2   Title  report 50% improvement in morning symptoms for improved function and mobiltiy    Status  New    Target Date  11/18/17      PT LONG TERM GOAL #3   Title  improve bil ankle dorsiflexion to at least 5 degrees actively for improved function    Status  New    Target Date  11/18/17      PT LONG TERM GOAL #4   Title  demonstrate 4/5 bil ankle strength for improved function    Status  New    Target Date  11/18/17            Plan - 10/20/17 0943    Clinical Impression Statement  pt reports continued soreness with inthe bil achilles/ calves with R>L. educated and performed TPDN in the R only which she tolerated well. continued manual techniques following DN to calm down muscle  stiffness. She was able to do ankle stretching and strengthening reporting decreased stiffness and pain. utilized MHP end of session to calm down soreness from DN which she reported no pain walking out.     PT Next Visit Plan  response to DN gastrocs/soleus, consider pulsed [place] to Rt achilles, manual/modalities PRN; review HEP and progress strengthening,  stretch hamstrings    PT Home Exercise Plan  ankle theraband strngthening, seated calf stretch    Consulted and Agree with Plan of Care  Patient       Patient will benefit from skilled therapeutic intervention in order to improve the following deficits and impairments:     Visit Diagnosis: Pain in left ankle and joints of left foot  Pain in right ankle and joints of right foot  Stiffness of right ankle, not elsewhere classified  Stiffness of left ankle, not elsewhere classified     Problem List Patient Active Problem List   Diagnosis Date Noted  . Hyperlipidemia 01/29/2016  . Atypical chest pain 01/29/2016  . Vitamin D deficiency 03/21/2015  . Paraproteinemia 11/12/2014  . Hypercalcemia 10/31/2014  . Hyperparathyroidism (HCC) 10/31/2014  . Osteoporosis 10/31/2014   Lulu Riding PT, DPT, LAT, ATC  10/20/17  9:50 AM       [facility] [address] [place], [place], [address] Phone: [phone]   Fax:  [phone]  Name: Tiffany Reyes MRN: [id number] Date of Birth: [birth year]

## 2017-10-24 ENCOUNTER — Encounter: Payer: Self-pay | Admitting: Physical Therapy

## 2017-10-24 ENCOUNTER — Ambulatory Visit: Payer: Medicare Other | Admitting: Physical Therapy

## 2017-10-24 DIAGNOSIS — M25571 Pain in right ankle and joints of right foot: Secondary | ICD-10-CM

## 2017-10-24 DIAGNOSIS — M25572 Pain in left ankle and joints of left foot: Secondary | ICD-10-CM | POA: Diagnosis not present

## 2017-10-24 DIAGNOSIS — M25671 Stiffness of right ankle, not elsewhere classified: Secondary | ICD-10-CM

## 2017-10-24 DIAGNOSIS — M25672 Stiffness of left ankle, not elsewhere classified: Secondary | ICD-10-CM

## 2017-10-24 NOTE — Therapy (Signed)
[facility] [address] [place], [place], [address] Phone: [phone]   Fax:  [phone]  Physical Therapy Treatment  Patient Details  Name: Tiffany Reyes MRN: [id number] Date of Birth: [birth year] Referring Provider: Dr. Albin Felling   Encounter Date: 10/24/2017  PT End of Session - 10/24/17 1101    Visit Number  4    Number of Visits  12    Date for PT Re-Evaluation  11/18/17    PT Start Time  1020    PT Stop Time  1102    PT Time Calculation (min)  42 min    Activity Tolerance  Patient tolerated treatment well    Behavior During Therapy  [facility] for tasks assessed/performed       Past Medical History:  Diagnosis Date  . Arthritis   . Hemochromatosis   . Hyperlipidemia   . Osteopenia   . Vitamin D deficiency     Past Surgical History:  Procedure Laterality Date  . BREAST BIOPSY Left   . COLONOSCOPY      There were no vitals filed for this visit.  Subjective Assessment - 10/24/17 1022    Subjective  "The last session the DN was good and I don't remember waking up without pain. i did realize I forgot my exercises at the clinic.     Currently in Pain?  Yes    Pain Score  1     Pain Orientation  Left;Right    Pain Type  Chronic pain    Pain Onset  More than a month ago    Pain Frequency  Constant                      [facility] Adult PT Treatment/Exercise - 10/24/17 1027      Knee/Hip Exercises: Sidelying   Hip ABduction  2 sets;Both;Strengthening 12 reps      Manual Therapy   Manual therapy comments  skilled palpation and monitoring during TPDN    Soft tissue mobilization  IASTM along bil calves and achilles tendons, with  active release techniques with pt performing active DF/PF      Ankle Exercises: Stretches   Soleus Stretch  2 reps;30 seconds slant board    Gastroc Stretch  2 reps;30 seconds slant board      Ankle Exercises: Seated   Other Seated Ankle Exercises  4-way theraband  strengthening 1 x 10 ea direction bil with red theraband      Ankle Exercises: Aerobic   Nustep  L5 x 5 min LE only       Trigger Point Dry Needling - 10/24/17 1033    Gastrocnemius Response  Twitch response elicited;Palpable increased muscle length bil    Soleus Response  Twitch response elicited;Palpable increased muscle length bil                PT Long Term Goals - 10/07/17 1406      PT LONG TERM GOAL #1   Title  independent with HEP    Status  New    Target Date  11/18/17      PT LONG TERM GOAL #2   Title  report 50% improvement in morning symptoms for improved function and mobiltiy    Status  New    Target Date  11/18/17      PT LONG TERM GOAL #3   Title  improve bil ankle dorsiflexion to at least 5 degrees actively for improved function  Status  New    Target Date  11/18/17      PT LONG TERM GOAL #4   Title  demonstrate 4/5 bil ankle strength for improved function    Status  New    Target Date  11/18/17            Plan - 10/24/17 1105    Clinical Impression Statement  pt reports decreased tightness in the calf. continued TPDN along bil gastroc/ soleus followed with IASTM techniques . continued ankle strengthening/ strethcing and hip strengthening activities which she reported no pain and declined modalities     PT Treatment/Interventions  ADLs/Self Care Home Management;Cryotherapy;Electrical Stimulation;Iontophoresis 4mg /ml Dexamethasone;Gait training;Therapeutic activities;Ultrasound;Therapeutic exercise;Balance training;Patient/family education;Stair training;Neuromuscular re-education;Passive range of motion;Dry needling;Taping;Vasopneumatic Device    PT Next Visit Plan  response to DN gastrocs/soleus, consider pulsed US to Rt achilles, manual/modalities PRN; review HEP and progress strengthening,  stretch hamstrings, update HEP for hip strengthening.     PT Home Exercise Plan  ankle theraband strngthening, seated calf stretch    Consulted and Agree  with Plan of Care  Patient       Patient will benefit from skilled therapeutic intervention in order to improve the following deficits and impairments:  Pain, Increased muscle spasms, Increased fascial restricitons, Decreased range of motion, Decreased strength, Impaired flexibility  Visit Diagnosis: Pain in left ankle and joints of left foot  Pain in right ankle and joints of right foot  Stiffness of right ankle, not elsewhere classified  Stiffness of left ankle, not elsewhere classified     Problem List Patient Active Problem List   Diagnosis Date Noted  . Hyperlipidemia 01/29/2016  . Atypical chest pain 01/29/2016  . Vitamin D deficiency 03/21/2015  . Paraproteinemia 11/12/2014  . Hypercalcemia 10/31/2014  . Hyperparathyroidism (HCC) 10/31/2014  . Osteoporosis 10/31/2014   Lulu RidingKristoffer Dave Mannes PT, DPT, LAT, ATC  10/24/17  11:08 AM      [facility] [address] [place], [place], [address] Phone: [phone]   Fax:  [phone]  Name: Tiffany Reyes MRN: [id number] Date of Birth: [birth year]

## 2017-10-28 ENCOUNTER — Ambulatory Visit: Payer: Medicare Other | Admitting: Physical Therapy

## 2017-10-28 ENCOUNTER — Encounter: Payer: Self-pay | Admitting: Physical Therapy

## 2017-10-28 DIAGNOSIS — M25571 Pain in right ankle and joints of right foot: Secondary | ICD-10-CM

## 2017-10-28 DIAGNOSIS — M25572 Pain in left ankle and joints of left foot: Secondary | ICD-10-CM

## 2017-10-28 DIAGNOSIS — M25671 Stiffness of right ankle, not elsewhere classified: Secondary | ICD-10-CM

## 2017-10-28 DIAGNOSIS — M25672 Stiffness of left ankle, not elsewhere classified: Secondary | ICD-10-CM

## 2017-10-28 NOTE — Therapy (Signed)
[facility] [address] [place], [place], [address] Phone: [phone]   Fax:  [phone]  Physical Therapy Treatment  Patient Details  Name: Tiffany Reyes MRN: [id number] Date of Birth: 01/23/1951 Referring Provider: Dr. Albin Felling   Encounter Date: 10/28/2017  PT End of Session - 10/28/17 0927    Visit Number  5    Number of Visits  12    Date for PT Re-Evaluation  11/18/17    Authorization Type  [employer] Medicare; $20 copay    PT Start Time  0840    PT Stop Time  0920    PT Time Calculation (min)  40 min    Activity Tolerance  Patient tolerated treatment well    Behavior During Therapy  [facility] for tasks assessed/performed       Past Medical History:  Diagnosis Date  . Arthritis   . Hemochromatosis   . Hyperlipidemia   . Osteopenia   . Vitamin D deficiency     Past Surgical History:  Procedure Laterality Date  . BREAST BIOPSY Left   . COLONOSCOPY      There were no vitals filed for this visit.  Subjective Assessment - 10/28/17 0846    Subjective  pain is improving, describes as discomfort only.  has been doing more driving to [facility] this past week as well.      Patient Stated Goals  "what is it" and any exercises to make pain go away    Currently in Pain?  Yes    Pain Score  1     Pain Location  Ankle    Pain Orientation  Left;Right    Pain Descriptors / Indicators  Discomfort    Pain Type  Chronic pain    Pain Onset  More than a month ago    Pain Frequency  Constant    Aggravating Factors   driving, morning    Pain Relieving Factors  wearing high heels for short times                      [facility] Adult PT Treatment/Exercise - 10/28/17 0849      Exercises   Exercises  Knee/Hip      Knee/Hip Exercises: Supine   Bridges  Both;10 reps    Bridges Limitations  with heel/toe raise      Knee/Hip Exercises: Sidelying   Hip ABduction  Both;15 reps      Ankle Exercises: Aerobic   Nustep   L3 x 7 min      Ankle Exercises: Stretches   Soleus Stretch  2 reps;30 seconds    Gastroc Stretch  2 reps;30 seconds      Ankle Exercises: Seated   Other Seated Ankle Exercises  4-way theraband strengthening 1 x 15 ea direction bil with red theraband             PT Education - 10/28/17 0928    Education provided  Yes    Education Details  compliance with HEP even after d/c from PT and that continuing PT because pt won't do HEP at home isn't a skilled need to continue PT    Person(s) Educated  Patient    Methods  Explanation    Comprehension  Verbalized understanding          PT Long Term Goals - 10/07/17 1406      PT LONG TERM GOAL #1   Title  independent with HEP    Status  New    Target Date  11/18/17      PT LONG TERM GOAL #2   Title  report 50% improvement in morning symptoms for improved function and mobiltiy    Status  New    Target Date  11/18/17      PT LONG TERM GOAL #3   Title  improve bil ankle dorsiflexion to at least 5 degrees actively for improved function    Status  New    Target Date  11/18/17      PT LONG TERM GOAL #4   Title  demonstrate 4/5 bil ankle strength for improved function    Status  New    Target Date  11/18/17            Plan - 10/28/17 0928    Clinical Impression Statement  Pt reporting decreased pain overall so session today focused on hip/ankle strengthening.  No increase in pain with exercises.  Progressing well with PT.    PT Treatment/Interventions  ADLs/Self Care Home Management;Cryotherapy;Electrical Stimulation;Iontophoresis 4mg /ml Dexamethasone;Gait training;Therapeutic activities;Ultrasound;Therapeutic exercise;Balance training;Patient/family education;Stair training;Neuromuscular re-education;Passive range of motion;Dry needling;Taping;Vasopneumatic Device    PT Next Visit Plan  consider pulsed US to Rt achilles, manual/modalities PRN; review HEP and progress strengthening,  stretch hamstrings, update HEP for hip  strengthening.        Patient will benefit from skilled therapeutic intervention in order to improve the following deficits and impairments:  Pain, Increased muscle spasms, Increased fascial restricitons, Decreased range of motion, Decreased strength, Impaired flexibility  Visit Diagnosis: Pain in left ankle and joints of left foot  Pain in right ankle and joints of right foot  Stiffness of right ankle, not elsewhere classified  Stiffness of left ankle, not elsewhere classified     Problem List Patient Active Problem List   Diagnosis Date Noted  . Hyperlipidemia 01/29/2016  . Atypical chest pain 01/29/2016  . Vitamin D deficiency 03/21/2015  . Paraproteinemia 11/12/2014  . Hypercalcemia 10/31/2014  . Hyperparathyroidism (HCC) 10/31/2014  . Osteoporosis 10/31/2014      Clarita CraneStephanie F Tawnie Ehresman, PT, DPT 10/28/17 9:30 AM    [facility] [address] [place], [place], [address] Phone: [phone]   Fax:  [phone]  Name: Adair Patterhyllis J Kauffman MRN: [id number] Date of Birth: 1951/02/18

## 2017-10-29 ENCOUNTER — Ambulatory Visit: Payer: Medicare Other | Admitting: Physical Therapy

## 2017-10-29 ENCOUNTER — Encounter: Payer: Self-pay | Admitting: Physical Therapy

## 2017-10-29 DIAGNOSIS — M25572 Pain in left ankle and joints of left foot: Secondary | ICD-10-CM

## 2017-10-29 DIAGNOSIS — M25672 Stiffness of left ankle, not elsewhere classified: Secondary | ICD-10-CM

## 2017-10-29 DIAGNOSIS — M25571 Pain in right ankle and joints of right foot: Secondary | ICD-10-CM

## 2017-10-29 DIAGNOSIS — M25671 Stiffness of right ankle, not elsewhere classified: Secondary | ICD-10-CM

## 2017-10-29 NOTE — Therapy (Signed)
[facility] [address] [place], [place], [address] Phone: [phone]   Fax:  [phone]  Physical Therapy Treatment  Patient Details  Name: Tiffany Reyes MRN: [id number] Date of Birth: 09-28-1950 Referring Provider: Dr. Albin FellingAnne Gonazales   Encounter Date: 10/29/2017  PT End of Session - 10/29/17 1659    Visit Number  6    Number of Visits  12    Date for PT Re-Evaluation  11/18/17    PT Start Time  1500    PT Stop Time  1540    PT Time Calculation (min)  40 min    Behavior During Therapy  [facility] for tasks assessed/performed       Past Medical History:  Diagnosis Date  . Arthritis   . Hemochromatosis   . Hyperlipidemia   . Osteopenia   . Vitamin D deficiency     Past Surgical History:  Procedure Laterality Date  . BREAST BIOPSY Left   . COLONOSCOPY      There were no vitals filed for this visit.  Subjective Assessment - 10/29/17 1459    Subjective  Right foot soreness.  Started getting out of bed this mornings.   Less stiffness in achilles ares.    Right foot does not feel like it will give away when I Get up at night.     Currently in Pain?  Yes    Pain Score  3     Pain Location  Ankle    Pain Orientation  Left;Right;Anterior;Posterior    Pain Descriptors / Indicators  Discomfort    Pain Type  Chronic pain    Aggravating Factors   drving,  possibly new exercises    Pain Relieving Factors  wiggling feet                      [facility] Adult PT Treatment/Exercise - 10/29/17 0001      Ankle Exercises: Standing   SLS  60 + right 45+ left    Heel Raises  10 reps    Heel Raises Limitations  2 sets ,  1 with eyes closed.    Toe Raise  10 reps 2 sets  also 1 set with eyes closed    Other Standing Ankle Exercises  rocker board, both PF/DF and medial lateral rocking without touching rin 4 bouts of 20 seconds with a 10 second rest each.  Challanging  also tried a few reps with eyes closed       Ankle  Exercises: Seated   Other Seated Ankle Exercises  4-way theraband strengthening 1 x 15 ea direction bil with red theraband foot sore where band rests on right foot       Ankle Exercises: Supine   Other Supine Ankle Exercises  mANUAL RESISTED 4 WAY MOVEMENTS TAKING 5 SECONDS TO MOVE 10  x EACH FOOT  no pain  fatigue and warmth in legs noted by patient             PT Education - 10/29/17 1659    Education provided  Yes    Education Details  exercise form techniques    Person(s) Educated  Patient    Methods  Explanation;Demonstration;Verbal cues;Tactile cues    Comprehension  Verbalized understanding;Returned demonstration          PT Long Term Goals - 10/07/17 1406      PT LONG TERM GOAL #1   Title  independent with HEP    Status  New    Target Date  11/18/17      PT LONG TERM GOAL #2   Title  report 50% improvement in morning symptoms for improved function and mobiltiy    Status  New    Target Date  11/18/17      PT LONG TERM GOAL #3   Title  improve bil ankle dorsiflexion to at least 5 degrees actively for improved function    Status  New    Target Date  11/18/17      PT LONG TERM GOAL #4   Title  demonstrate 4/5 bil ankle strength for improved function    Status  New    Target Date  11/18/17            Plan - 10/29/17 1700    Clinical Impression Statement    Patient tolerated advanced standing exercises for ankles without increased pain.  She has soreness right dorsum foot  which may be due to newly added exercises added last visit.  Pain improved posterior achilles area. Progress toward LTG#@    PT Next Visit Plan   manual/modalities PRN; review HEP and progress strengthening,  stretch hamstrings, update HEP for hip strengthening.     PT Home Exercise Plan  ankle theraband strngthening, seated calf stretch    Consulted and Agree with Plan of Care  Patient       Patient will benefit from skilled therapeutic intervention in order to improve the following  deficits and impairments:     Visit Diagnosis: Pain in left ankle and joints of left foot  Pain in right ankle and joints of right foot  Stiffness of right ankle, not elsewhere classified  Stiffness of left ankle, not elsewhere classified     Problem List Patient Active Problem List   Diagnosis Date Noted  . Hyperlipidemia 01/29/2016  . Atypical chest pain 01/29/2016  . Vitamin D deficiency 03/21/2015  . Paraproteinemia 11/12/2014  . Hypercalcemia 10/31/2014  . Hyperparathyroidism (HCC) 10/31/2014  . Osteoporosis 10/31/2014    HARRIS,KAREN  PTA 10/29/2017, 5:03 PM  [facility] [address] [place], [place], [address] Phone: [phone]   Fax:  [phone]  Name: Tiffany Reyes MRN: [id number] Date of Birth: 23-Mar-1951

## 2017-10-31 ENCOUNTER — Encounter: Payer: Medicare Other | Admitting: Physical Therapy

## 2017-11-04 ENCOUNTER — Encounter: Payer: Self-pay | Admitting: Physical Therapy

## 2017-11-04 ENCOUNTER — Ambulatory Visit: Payer: Medicare Other | Admitting: Physical Therapy

## 2017-11-04 DIAGNOSIS — M25571 Pain in right ankle and joints of right foot: Secondary | ICD-10-CM

## 2017-11-04 DIAGNOSIS — M25572 Pain in left ankle and joints of left foot: Secondary | ICD-10-CM

## 2017-11-04 DIAGNOSIS — M25671 Stiffness of right ankle, not elsewhere classified: Secondary | ICD-10-CM

## 2017-11-04 DIAGNOSIS — M25672 Stiffness of left ankle, not elsewhere classified: Secondary | ICD-10-CM

## 2017-11-04 NOTE — Therapy (Signed)
[facility] [address] [place], [place], [address] Phone: [phone]   Fax:  [phone]  Physical Therapy Treatment  Patient Details  Name: Tiffany Reyes MRN: [id number] Date of Birth: 11-13-1950 Referring Provider: Dr. Albin Felling   Encounter Date: 11/04/2017  PT End of Session - 11/04/17 1531    Visit Number  7    Number of Visits  12    Date for PT Re-Evaluation  11/18/17    PT Start Time  1531    PT Stop Time  1618    PT Time Calculation (min)  47 min    Activity Tolerance  Patient tolerated treatment well    Behavior During Therapy  [facility] for tasks assessed/performed       Past Medical History:  Diagnosis Date  . Arthritis   . Hemochromatosis   . Hyperlipidemia   . Osteopenia   . Vitamin D deficiency     Past Surgical History:  Procedure Laterality Date  . BREAST BIOPSY Left   . COLONOSCOPY      There were no vitals filed for this visit.  Subjective Assessment - 11/04/17 1531    Subjective  "I am doing much better, I haven't all my papers and have been doing my exercises. I was able to drive without issues"     Currently in Pain?  No/denies    Pain Descriptors / Indicators  -- stiffness    Pain Onset  More than a month ago    Pain Frequency  Intermittent    Aggravating Factors   unsure    Pain Relieving Factors  wiggling.                 No data recorded       [facility] Adult PT Treatment/Exercise - 11/04/17 1539      Knee/Hip Exercises: Stretches   Gastroc Stretch  2 reps;30 seconds      Knee/Hip Exercises: Aerobic   Recumbent Bike  L2 x 5 min      Knee/Hip Exercises: Standing   Heel Raises  2 sets;15 reps with bil HHA for safety      Knee/Hip Exercises: Seated   Sit to Sand  2 sets;10 reps with green theraband around the knees      Ankle Exercises: Seated   Towel Crunch  4 reps    Other Seated Ankle Exercises  4-way theraband strengthening 1 x 15 ea direction bil with  green theraband      Ankle Exercises: Standing   Other Standing Ankle Exercises  rocker board SLS, DF/PF, INV/ EVE 1 x 15          Balance Exercises - 11/04/17 1607      Balance Exercises: Standing   SLS  Foam/compliant surface;Eyes open;4 reps;30 secs 2 x bil L/R        PT Education - 11/04/17 1622    Education provided  Yes    Education Details  updated HEP    Person(s) Educated  Patient    Methods  Explanation;Verbal cues    Comprehension  Verbalized understanding;Verbal cues required          PT Long Term Goals - 10/07/17 1406      PT LONG TERM GOAL #1   Title  independent with HEP    Status  New    Target Date  11/18/17      PT LONG TERM GOAL #2   Title  report 50% improvement in morning symptoms  for improved function and mobiltiy    Status  New    Target Date  11/18/17      PT LONG TERM GOAL #3   Title  improve bil ankle dorsiflexion to at least 5 degrees actively for improved function    Status  New    Target Date  11/18/17      PT LONG TERM GOAL #4   Title  demonstrate 4/5 bil ankle strength for improved function    Status  New    Target Date  11/18/17            Plan - 11/04/17 1609    Clinical Impression Statement  pt reports she is doing much better today. continued workingon ankle/ hip strength progressing with increased resistance, held off on DN due to pt reporting no pain or tightness. she does require multiple verbal cues for proper techniques but is able to complete all exercises provided.  continued balance training which she performed well reporting no pain. If pt continues to do well plan to discharge with in the next few visits.     PT Next Visit Plan   manual/modalities PRN; review HEP and progress strengthening,  stretch hamstrings, update HEP for hip strengthening.     PT Home Exercise Plan  ankle theraband strngthening, seated calf stretch, heel raise, sit to stand with theraband around knees.     Consulted and Agree with Plan of  Care  Patient       Patient will benefit from skilled therapeutic intervention in order to improve the following deficits and impairments:  Pain, Increased muscle spasms, Increased fascial restricitons, Decreased range of motion, Decreased strength, Impaired flexibility  Visit Diagnosis: Pain in left ankle and joints of left foot  Pain in right ankle and joints of right foot  Stiffness of right ankle, not elsewhere classified  Stiffness of left ankle, not elsewhere classified     Problem List Patient Active Problem List   Diagnosis Date Noted  . Hyperlipidemia 01/29/2016  . Atypical chest pain 01/29/2016  . Vitamin D deficiency 03/21/2015  . Paraproteinemia 11/12/2014  . Hypercalcemia 10/31/2014  . Hyperparathyroidism (HCC) 10/31/2014  . Osteoporosis 10/31/2014   Lulu RidingKristoffer Aymara Sassi PT, DPT, LAT, ATC  11/04/17  4:24 PM      [facility] [address] [place], [place], [address] Phone: [phone]   Fax:  [phone]  Name: Tiffany Reyes MRN: [id number] Date of Birth: 06/12/1951

## 2017-11-10 ENCOUNTER — Encounter: Payer: Medicare Other | Admitting: Physical Therapy

## 2017-11-12 ENCOUNTER — Encounter: Payer: Medicare Other | Admitting: Physical Therapy

## 2017-11-18 ENCOUNTER — Encounter: Payer: Medicare Other | Admitting: Physical Therapy

## 2017-11-21 ENCOUNTER — Encounter: Payer: Medicare Other | Admitting: Physical Therapy

## 2017-11-25 ENCOUNTER — Encounter: Payer: Self-pay | Admitting: Physical Therapy

## 2017-11-25 ENCOUNTER — Ambulatory Visit: Payer: Medicare Other | Attending: Family Medicine | Admitting: Physical Therapy

## 2017-11-25 DIAGNOSIS — M25671 Stiffness of right ankle, not elsewhere classified: Secondary | ICD-10-CM | POA: Insufficient documentation

## 2017-11-25 DIAGNOSIS — M25672 Stiffness of left ankle, not elsewhere classified: Secondary | ICD-10-CM | POA: Insufficient documentation

## 2017-11-25 DIAGNOSIS — M25571 Pain in right ankle and joints of right foot: Secondary | ICD-10-CM | POA: Diagnosis present

## 2017-11-25 DIAGNOSIS — M25572 Pain in left ankle and joints of left foot: Secondary | ICD-10-CM | POA: Insufficient documentation

## 2017-11-25 NOTE — Therapy (Signed)
[place], [place], [address] Phone: [phone]   Fax:  [phone]  Physical Therapy Treatment / Discharge Summary  Patient Details  Name: Tiffany Reyes MRN: [id number] Date of Birth: 04-20-1951 Referring Provider: Dr. Faylene Kurtz   Encounter Date: 11/25/2017  PT End of Session - 11/25/17 0847    Visit Number  8    Number of Visits  12    Date for PT Re-Evaluation  11/25/17    PT Start Time  0847    PT Stop Time  0920    PT Time Calculation (min)  33 min    Activity Tolerance  Patient tolerated treatment well    Behavior During Therapy  [facility] for tasks assessed/performed       Past Medical History:  Diagnosis Date  . Arthritis   . Hemochromatosis   . Hyperlipidemia   . Osteopenia   . Vitamin D deficiency     Past Surgical History:  Procedure Laterality Date  . BREAST BIOPSY Left   . COLONOSCOPY      There were no vitals filed for this visit.  Subjective Assessment - 11/25/17 0846    Subjective  "I've had more issues with breathing lately, which is more likely due to bronchitis. I feel like I need to start over due to being on my back. I feel like I get some discomfort, with standing and walking in the ankles"     Patient Stated Goals  "what is it" and any exercises to make pain go away    Currently in Pain?  Yes    Pain Score  1     Pain Location  Ankle    Pain Orientation  Left    Pain Descriptors / Indicators  Aching    Pain Type  Chronic pain    Pain Onset  More than a month ago    Pain Frequency  Intermittent    Aggravating Factors   unusre    Pain Relieving Factors  stretching,          [facility] PT Assessment - 11/25/17 0853      AROM   Right Ankle Dorsiflexion  10    Right Ankle Plantar Flexion  60    Right Ankle Inversion  22    Right Ankle Eversion  16    Left Ankle Dorsiflexion  8    Left Ankle Plantar Flexion  60    Left Ankle Inversion  22    Left Ankle Eversion   13      Strength   Right Ankle Dorsiflexion  5/5    Right Ankle Inversion  4+/5    Right Ankle Eversion  5/5    Left Ankle Dorsiflexion  5/5    Left Ankle Inversion  5/5    Left Ankle Eversion  5/5                   [facility] Adult PT Treatment/Exercise - 11/25/17 0001      Knee/Hip Exercises: Seated   Sit to Sand  2 sets;10 reps with band around knees for glute activation      Ankle Exercises: Supine   T-Band  4-way ankle strengthening with green band      Ankle Exercises: Stretches   Soleus Stretch  2 reps;30 seconds    Gastroc Stretch  2 reps;30 seconds             PT Education - 11/25/17 6256  Education provided  Yes    Education Details  reviewed previously provided HEP, benefits of continued strengthening and astretching being more pro-active versus reactive. increase reps/ sets for added endurance.     Person(s) Educated  Patient    Methods  Explanation;Verbal cues    Comprehension  Verbalized understanding;Verbal cues required          PT Long Term Goals - 11/25/17 0906      PT LONG TERM GOAL #2   Title  report 50% improvement in morning symptoms for improved function and mobiltiy    Period  Weeks    Status  Achieved      PT LONG TERM GOAL #3   Title  improve bil ankle dorsiflexion to at least 5 degrees actively for improved function    Period  Weeks    Status  Achieved      PT LONG TERM GOAL #4   Title  demonstrate 4/5 bil ankle strength for improved function    Status  Achieved            Plan - 11/25/17 0924    Clinical Impression Statement  Mrs. Gugliotta reported having a bout of bronchitis which caused her to be inactive and bed ridden for 3 weeks, and was worried she was having to start over. He has improved her ankle mobility bil to [facility], increased strength and additionally reports minimal pain / tightness. she  met all goals today. she is able to maintain and progress her current level of function independently and no longer  requires PT.     PT Next Visit Plan  D/C    PT Home Exercise Plan  ankle theraband strngthening, seated calf stretch, heel raise, sit to stand with theraband around knees.     Consulted and Agree with Plan of Care  Patient       Patient will benefit from skilled therapeutic intervention in order to improve the following deficits and impairments:     Visit Diagnosis: Pain in left ankle and joints of left foot  Pain in right ankle and joints of right foot  Stiffness of right ankle, not elsewhere classified  Stiffness of left ankle, not elsewhere classified     Problem List Patient Active Problem List   Diagnosis Date Noted  . Hyperlipidemia 01/29/2016  . Atypical chest pain 01/29/2016  . Vitamin D deficiency 03/21/2015  . Paraproteinemia 11/12/2014  . Hypercalcemia 10/31/2014  . Hyperparathyroidism ([place]) 10/31/2014  . Osteoporosis 10/31/2014   Starr Lake PT, DPT, LAT, ATC  11/25/17  9:27 AM      [facility] [address] [place], [place], [address] Phone: [phone]   Fax:  [phone]  Name: Tiffany Reyes MRN: [id number] Date of Birth: [birth year]      PHYSICAL THERAPY DISCHARGE SUMMARY  Visits from Start of Care: 8  Current functional level related to goals / functional outcomes: See goals   Remaining deficits: Intermittent tightness after prolonged inactivity. Pt reports controlled with stretching and exercise. See above assessment   Education / Equipment: HEP, posture, theraband,   Plan: Patient agrees to discharge.  Patient goals were met. Patient is being discharged due to meeting the stated rehab goals.  ?????         Maiah Sinning PT, DPT, LAT, ATC  11/25/17  9:27 AM

## 2017-12-02 ENCOUNTER — Ambulatory Visit: Payer: Medicare Other | Admitting: Physical Therapy

## 2018-01-19 ENCOUNTER — Ambulatory Visit: Payer: Medicare Other | Attending: Family Medicine | Admitting: Physical Therapy

## 2018-01-19 ENCOUNTER — Encounter: Payer: Self-pay | Admitting: Physical Therapy

## 2018-01-19 ENCOUNTER — Other Ambulatory Visit: Payer: Self-pay

## 2018-01-19 DIAGNOSIS — G8929 Other chronic pain: Secondary | ICD-10-CM

## 2018-01-19 DIAGNOSIS — M25652 Stiffness of left hip, not elsewhere classified: Secondary | ICD-10-CM | POA: Diagnosis present

## 2018-01-19 DIAGNOSIS — M545 Low back pain: Secondary | ICD-10-CM | POA: Insufficient documentation

## 2018-01-19 DIAGNOSIS — M6281 Muscle weakness (generalized): Secondary | ICD-10-CM | POA: Insufficient documentation

## 2018-01-19 DIAGNOSIS — M25651 Stiffness of right hip, not elsewhere classified: Secondary | ICD-10-CM | POA: Insufficient documentation

## 2018-01-19 NOTE — Patient Instructions (Signed)
   Seated Lumbar Flexion Stretch  Start sitting on a chair with good posture, reach arms towards the floor until you feel a stretch in your lower back and hold.  Hold 10 seconds, then relax.  Repeat  5 times, 2-3 times per day.    SELF TREATMENT FOR LEFT LUMBAR ROTATION FIXATION - SI - HESCH METHOD 9  Sit and bend forward comfortably. Gently side bend and rotate to the RIGHT by placing weight on your RIGHT hand as shown.  Turn as far as is comfortable- you should feel a deep stretch but not necessarily any other discomfort.  Hold for 10 seconds, then relax.  Repeat 3-5 times each side, 2-3 times per day.    FROG Hip adductor and groin stretch supine  Lying supine with the knees bent, feet together, let the knees fall out to the side until a stretch is felt through the groin.    Hold for 30 seconds, then relax and bring legs together.  Repeat 3 times, 2-3 times per day.

## 2018-01-19 NOTE — Therapy (Signed)
[facility] [address] [place], [place], [address] Phone: [phone]   Fax:  [phone]  Physical Therapy Evaluation  Patient Details  Name: Tiffany Reyes MRN: [id number] Date of Birth: [birth year] Referring Provider: Sheldon SilvanAnne Gonzalez    Encounter Date: 01/19/2018  PT End of Session - 01/19/18 1537    Visit Number  1    Number of Visits  9    Date for PT Re-Evaluation  02/16/18    Authorization Type  [employer] Medicare  (PN at visit 10, KX at visit 15)    Authorization Time Period  01/19/18 to 02/18/18    PT Start Time  1450    PT Stop Time  1530    PT Time Calculation (min)  40 min    Activity Tolerance  Patient tolerated treatment well    Behavior During Therapy  [facility] for tasks assessed/performed       Past Medical History:  Diagnosis Date  . Arthritis   . Hemochromatosis   . Hyperlipidemia   . Osteopenia   . Vitamin D deficiency     Past Surgical History:  Procedure Laterality Date  . BREAST BIOPSY Left   . COLONOSCOPY      There were no vitals filed for this visit.   Subjective Assessment - 01/19/18 1447    Subjective  I just finished my ankle PT, at the end I had a bad case of bronchitis which put me in bed for most of 4 weeks. 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 It was too much effort to do PT for my ankles when I had bronchities and couldn't breathe. I've had issues with a couple discs in my spine, I also have pressure in my hips causing pain. As long as I'm walking and doing stuff its fine, but when I go to bed at night I get stiff. I have numbness and tingling going down to both of my feet. No incontinence of bowel or bladder. My left leg on the upper inner thigh really hurts me badly sometimes.     Pertinent History  osteoporosis, Lt foot fx 2015    How long can you sit comfortably?  6/10- have to shift around to be comfortable     How long can you stand  comfortably?  6/10- not sure     How long can you walk comfortably?  6/10- unlimited     Patient Stated Goals  be able to sleep without pain, get rid of pain in upper inner thigh     Currently in Pain?  Yes    Pain Score  1     Pain Location  Leg    Pain Orientation  Left;Medial;Upper    Pain Descriptors / Indicators  Aching    Pain Type  Chronic pain    Pain Radiating Towards  down towards knee     Pain Onset  More than a month ago    Pain Frequency  Intermittent    Aggravating Factors   laying down to sleep     Pain Relieving Factors  moving around     Effect of Pain on Daily Activities  moderate          [facility] PT Assessment - 01/19/18 0001      Assessment   Medical Diagnosis  low back pain     Referring Provider  Sheldon SilvanAnne Gonzalez     Onset Date/Surgical Date  --  chronic     Next MD Visit  Dr. Jayme Cloud PRN     Prior Therapy  recently  [facility]ospital from ankle PT at this clinic       Precautions   Precautions  None      Restrictions   Weight Bearing Restrictions  No      Balance Screen   Has the patient fallen in the past 6 months  No    Has the patient had a decrease in activity level because of a fear of falling?   No    Is the patient reluctant to leave their home because of a fear of falling?   No      Prior Function   Level of Independence  Independent    Vocation  Retired    Leisure  yoga, going to the gym      Observation/Other Assessments   Observations  SLR (-), scour (-), FABER (-), significant L hip ADD muscle tightness and spasm noted       Sensation   Light Touch  Impaired by gross assessment    Additional Comments  some impairment noted in LTT L5-S1 dermatomes       AROM   AROM Assessment Site  Lumbar    Lumbar Flexion  full ROM has to use arms to push self back up; RFIS peripheralizes pain     Lumbar Extension  full ROM; REIS centralizes pain but inner thigh and nunbmess increase     Lumbar - Right Side Bend  fingertips to knee     Lumbar - Left Side Bend   fingertips to knee       Strength   Strength Assessment Site  Hip;Knee    Right/Left Hip  Right;Left    Right Hip Flexion  4+/5    Right Hip Extension  3+/5    Right Hip ABduction  4+/5    Left Hip Flexion  4/5    Left Hip Extension  3/5    Left Hip ABduction  3+/5    Right/Left Knee  Right;Left    Right Knee Flexion  5/5    Right Knee Extension  5/5    Left Knee Flexion  4/5    Left Knee Extension  4+/5      Flexibility   Soft Tissue Assessment /Muscle Length  yes    Hamstrings  WFL     Quadriceps  moderate limitation B     Piriformis  moderate limitation B       Palpation   Palpation comment  TTP biliateral upper glutes, trigger points noted bilateral muscle glute groups                 Objective measurements completed on examination: See above findings.      [facility] Adult PT Treatment/Exercise - 01/19/18 0001      Exercises   Exercises  Lumbar      Lumbar Exercises: Stretches   Other Lumbar Stretch Exercise  seated lumbar flexion stretches and rotation stretches in sitting       Lumbar Exercises: Supine   Other Supine Lumbar Exercises  frog stretch 3x30 seconds              PT Education - 01/19/18 1536    Education provided  Yes    Education Details  exam findings, POC, prognosis, HEP, importance of activity to tolerance and not into duration/intenisty that increases pain     Person(s) Educated  Patient    Methods  Explanation  Comprehension  Verbalized understanding       PT Short Term Goals - 01/19/18 1544      PT SHORT TERM GOAL #1   Title  Patient to be compliant with appropriate HEP, to be updated PRN     Time  1    Period  Weeks    Status  New    Target Date  01/26/18        PT Long Term Goals - 01/19/18 1545      PT LONG TERM GOAL #1   Title  Patient to demonstrate improvement of functional strength by at least 1 MMT grade in all tested groups in order to reduce pain and symptoms     Time  4    Period  Weeks    Status   New    Target Date  02/16/18      PT LONG TERM GOAL #2   Title  Patient to demonstrate resolution of L hip adductor spasm in order to reduce pain and improve tolerance to mobility     Time  4    Period  Weeks    Status  New      PT LONG TERM GOAL #3   Title  Patient to demonstrate bilateral hip IR/ER ROM as being at least 30 degrees in order to improve gross mechanics and reduce stress on lumbar spine     Time  4    Period  Weeks    Status  New      PT LONG TERM GOAL #4   Title  Patient to report she has been able to sleep through the night without waking due to pain in order to improve QOL and sleep patterns     Time  4    Period  Weeks    Status  New             Plan - 01/19/18 1542    Clinical Impression Statement  Patient arrives with reports of chronic back pain which has gotten worse since she has been more sedentary over the past couple of months; she reports a "locking" feeling in her low back as well as pain in her left inner thigh. Examination reveals functional stiffness of lumbar spine and bilateral hips with extreme limitations in hip IR/ER, as well as functional muscle weakness, numerous trigger points in gluteal musculature, and significant muscle spasm in left hip adductor groups. She will benefit from skilled PT services to address functional deficits, reduce pain, and assist in return to regular gym activities moving forward.     Clinical Presentation  Stable    Clinical Decision Making  Low    Rehab Potential  Good    PT Frequency  2x / week    PT Duration  4 weeks    PT Treatment/Interventions  ADLs/Self Care Home Management;Biofeedback;Cryotherapy;Electrical Stimulation;Iontophoresis 4mg /ml Dexamethasone;Moist Heat;Ultrasound;Gait training;Stair training;Functional mobility training;Therapeutic activities;Therapeutic exercise;Balance training;Neuromuscular re-education;Patient/family education;Manual techniques;Passive range of motion;Dry needling;Taping     PT Next Visit Plan  review lumbar HEP and goals; work on lumbar and hip mobility, functional strength, hip IR/ER ROM    PT Home Exercise Plan  Eval: seated lumbar flexion stretch, seated lumbar rotation stretch, frog stretch     Consulted and Agree with Plan of Care  Patient       Patient will benefit from skilled therapeutic intervention in order to improve the following deficits and impairments:  Increased fascial restricitons, Improper body mechanics, Pain, Decreased coordination, Decreased mobility, Increased  muscle spasms, Decreased activity tolerance, Decreased strength, Hypomobility, Impaired flexibility  Visit Diagnosis: Chronic bilateral low back pain without sciatica - Plan: PT plan of care cert/re-cert  Muscle weakness (generalized) - Plan: PT plan of care cert/re-cert  Stiffness of left hip, not elsewhere classified - Plan: PT plan of care cert/re-cert  Stiffness of right hip, not elsewhere classified - Plan: PT plan of care cert/re-cert     Problem List Patient Active Problem List   Diagnosis Date Noted  . Hyperlipidemia 01/29/2016  . Atypical chest pain 01/29/2016  . Vitamin D deficiency 03/21/2015  . Paraproteinemia 11/12/2014  . Hypercalcemia 10/31/2014  . Hyperparathyroidism ([facility]) 10/31/2014  . Osteoporosis 10/31/2014    Nedra Hai PT, DPT, CBIS  Supplemental Physical Therapist [facility]   Pager [phone]   [facility] [address] [place], [place], [address] Phone: [phone]   Fax:  [phone]  Name: Tiffany Reyes MRN: [id number] Date of Birth: October 24, 1950

## 2018-01-23 ENCOUNTER — Ambulatory Visit: Payer: Medicare Other

## 2018-01-23 DIAGNOSIS — M545 Low back pain: Principal | ICD-10-CM

## 2018-01-23 DIAGNOSIS — M25651 Stiffness of right hip, not elsewhere classified: Secondary | ICD-10-CM

## 2018-01-23 DIAGNOSIS — M6281 Muscle weakness (generalized): Secondary | ICD-10-CM

## 2018-01-23 DIAGNOSIS — M25652 Stiffness of left hip, not elsewhere classified: Secondary | ICD-10-CM

## 2018-01-23 DIAGNOSIS — G8929 Other chronic pain: Secondary | ICD-10-CM

## 2018-01-23 NOTE — Patient Instructions (Signed)
Piriformis Stretch, Supine    Lie supine, legs bent, feet flat. Raise one bent leg and, grasping ankle with both hands, pull leg toward opposite shoulder. Hold ___ seconds.  Repeat ___ times per session. Do ___ sessions per day. Perform with other leg straight.                                                                                                                                                                                                                                                                                                                             ALSO PUSH KNEE DOWN  30 SEC AND PULL KNEE TOWARD OPPOSITE SHOULDER         RT/LTOuter Hip Stretch: Reclined IT Band Stretch (Strap)    Strap around opposite foot, pull across only as far as possible with shoulders on mat. Hold for ____ breaths. Repeat ____ times each leg.  Copyright  VHI. All rights reserved.   Copyright  VHI. All rights reserved.

## 2018-01-23 NOTE — Therapy (Signed)
[facility] [address] [place] [address] [place], [place], [address] Phone: [phone]   Fax:  [phone]  Physical Therapy Treatment  Patient Details  Name: Tiffany Reyes MRN: [id number] Date of Birth: 07-May-1951 Referring Provider: Sheldon Silvan    Encounter Date: 01/23/2018  PT End of Session - 01/23/18 1006    Visit Number  2    Number of Visits  9    Date for PT Re-Evaluation  02/16/18    Authorization Type  [employer] Medicare  (PN at visit 10, KX at visit 15)    Authorization Time Period  01/19/18 to 02/18/18    PT Start Time  1005    PT Stop Time  1100    PT Time Calculation (min)  55 min    Activity Tolerance  Patient tolerated treatment well    Behavior During Therapy  [facility] for tasks assessed/performed       Past Medical History:  Diagnosis Date  . Arthritis   . Hemochromatosis   . Hyperlipidemia   . Osteopenia   . Vitamin D deficiency     Past Surgical History:  Procedure Laterality Date  . BREAST BIOPSY Left   . COLONOSCOPY      There were no vitals filed for this visit.  Subjective Assessment - 01/23/18 1010    Subjective  She reports heip and back pain weakness LT leg , Problems with sleep.  Exercise eases pain.  New pain this episode medial Lt thigh. Walked 45 min limited by back pain. (2/10)    Currently in Pain?  Yes    Pain Score  2  stand and walk    Pain Location  Back    Pain Orientation  Lower Lt more    Pain Descriptors / Indicators  Aching    Pain Type  Chronic pain    Pain Onset  More than a month ago    Pain Frequency  Intermittent                       [facility] Adult PT Treatment/Exercise - 01/23/18 0001      Lumbar Exercises: Stretches   Figure 4 Stretch  1 rep;30 seconds    Figure 4 Stretch Limitations  RT and LT   ER and adduction    Other Lumbar Stretch Exercise  seated lumbar flexion stretches and rotation stretches in sitting  also pulling on each thigh for flexiand  with arms ovehead inhale and flex to floor exhale x 8 repson      Lumbar Exercises: Aerobic   Nustep  L3 5 min UE and LE      Knee/Hip Exercises: Stretches   Other Knee/Hip Stretches  supine frog and leg extended abduction 30 sec       Manual Therapy   Manual Therapy  Joint mobilization;Passive ROM    Joint Mobilization  Prone PA glides to each hip Gr 3-4        Passive ROM  hip rotation bilaterally  prone and supine   ITB stretch RT and Lt                PT Education - 01/23/18 1047    Education Details  HEP see instructions and also ITB from cabinet  and prone frog position single leg stretch  all 2x/day 2-3 reps 30 sec    Person(s) Educated  Patient    Methods  Explanation;Demonstration;Handout;Verbal cues    Comprehension  Verbalized understanding  PT Short Term Goals - 01/19/18 1544      PT SHORT TERM GOAL #1   Title  Patient to be compliant with appropriate HEP, to be updated PRN     Time  1    Period  Weeks    Status  New    Target Date  01/26/18        PT Long Term Goals - 01/19/18 1545      PT LONG TERM GOAL #1   Title  Patient to demonstrate improvement of functional strength by at least 1 MMT grade in all tested groups in order to reduce pain and symptoms     Time  4    Period  Weeks    Status  New    Target Date  02/16/18      PT LONG TERM GOAL #2   Title  Patient to demonstrate resolution of L hip adductor spasm in order to reduce pain and improve tolerance to mobility     Time  4    Period  Weeks    Status  New      PT LONG TERM GOAL #3   Title  Patient to demonstrate bilateral hip IR/ER ROM as being at least 30 degrees in order to improve gross mechanics and reduce stress on lumbar spine     Time  4    Period  Weeks    Status  New      PT LONG TERM GOAL #4   Title  Patient to report she has been able to sleep through the night without waking due to pain in order to improve QOL and sleep patterns     Time  4    Period  Weeks     Status  New            Plan - 01/23/18 1006    Clinical Impression Statement  Worked on hip ROM today and trunk flexion . She was able to touch floor with hands  albet with effort post stretching .   She reported back fel;tb better and moveing better but leg felt tingling  . Asked her tpo let us know if this persisted or goes away next session    PT Treatment/Interventions  ADLs/Self Care Home Management;Biofeedback;Cryotherapy;Electrical Stimulation;Iontophoresis 4mg /ml Dexamethasone;Moist Heat;Ultrasound;Gait training;Stair training;Functional mobility training;Therapeutic activities;Therapeutic exercise;Balance training;Neuromuscular re-education;Patient/family education;Manual techniques;Passive range of motion;Dry needling;Taping    PT Next Visit Plan  Review stretching , manual  strength    PT Home Exercise Plan  Eval: seated lumbar flexion stretch, seated lumbar rotation stretch, frog stretch supine and prone. Fig 4 IR/ER and piriformis, ITB stretch    Consulted and Agree with Plan of Care  Patient       Patient will benefit from skilled therapeutic intervention in order to improve the following deficits and impairments:  Increased fascial restricitons, Improper body mechanics, Pain, Decreased coordination, Decreased mobility, Increased muscle spasms, Decreased activity tolerance, Decreased strength, Hypomobility, Impaired flexibility  Visit Diagnosis: Chronic bilateral low back pain without sciatica  Muscle weakness (generalized)  Stiffness of left hip, not elsewhere classified  Stiffness of right hip, not elsewhere classified     Problem List Patient Active Problem List   Diagnosis Date Noted  . Hyperlipidemia 01/29/2016  . Atypical chest pain 01/29/2016  . Vitamin D deficiency 03/21/2015  . Paraproteinemia 11/12/2014  . Hypercalcemia 10/31/2014  . Hyperparathyroidism ([facility]) 10/31/2014  . Osteoporosis 10/31/2014    Caprice RedChasse, Delona Clasby M  PT 01/23/2018, 11:51 AM  [facility] [address] [place], [place], [address] Phone: [phone]   Fax:  [phone]  Name: Tiffany Reyes MRN: [id number] Date of Birth: 13-Apr-1951

## 2018-01-26 ENCOUNTER — Ambulatory Visit: Payer: Medicare Other | Admitting: Physical Therapy

## 2018-01-26 ENCOUNTER — Encounter: Payer: Self-pay | Admitting: Physical Therapy

## 2018-01-26 DIAGNOSIS — M6281 Muscle weakness (generalized): Secondary | ICD-10-CM

## 2018-01-26 DIAGNOSIS — G8929 Other chronic pain: Secondary | ICD-10-CM

## 2018-01-26 DIAGNOSIS — M545 Low back pain, unspecified: Secondary | ICD-10-CM

## 2018-01-26 DIAGNOSIS — M25652 Stiffness of left hip, not elsewhere classified: Secondary | ICD-10-CM

## 2018-01-26 NOTE — Therapy (Signed)
[facility] Outpatient Rehabilitation [facility] [address] [place], [place], [address] Phone: [phone]   Fax:  [phone]  Physical Therapy Treatment  Patient Details  Name: Tiffany Reyes MRN: [id number] Date of Birth: 12-26-1950 Referring Provider: Sheldon Silvan    Encounter Date: 01/26/2018  PT End of Session - 01/26/18 1143    Visit Number  3    Number of Visits  9    Date for PT Re-Evaluation  02/16/18    Authorization Type  [employer] Medicare  (PN at visit 10, KX at visit 15)    PT Start Time  1145    PT Stop Time  1228    PT Time Calculation (min)  43 min    Activity Tolerance  Patient tolerated treatment well    Behavior During Therapy  [facility] for tasks assessed/performed       Past Medical History:  Diagnosis Date  . Arthritis   . Hemochromatosis   . Hyperlipidemia   . Osteopenia   . Vitamin D deficiency     Past Surgical History:  Procedure Laterality Date  . BREAST BIOPSY Left   . COLONOSCOPY      There were no vitals filed for this visit.  Subjective Assessment - 01/26/18 1142    Subjective  "I've been in the car for the last two days and I've been more stiffness in the L hip/ leg"    Currently in Pain?  Yes    Pain Score  1     Pain Location  Back    Pain Descriptors / Indicators  Tightness    Pain Type  Chronic pain    Pain Onset  More than a month ago    Pain Frequency  Intermittent    Aggravating Factors   laying down    Pain Relieving Factors  stretching                       [facility] Adult PT Treatment/Exercise - 01/26/18 1150      Lumbar Exercises: Stretches   Passive Hamstring Stretch  2 reps;30 seconds    Other Lumbar Stretch Exercise  contract/ relax adductor stretch 3  x  30 sec with 10 sec contraction      Lumbar Exercises: Aerobic   Nustep  L5 x 5 min UE/LE      Knee/Hip Exercises: Sidelying   Hip ABduction  Strengthening;Left;10 reps;2 sets combined with 10 reps CW/CCW       Manual  Therapy   Manual Therapy  Muscle Energy Technique    Manual therapy comments  MTPR along the L hip adductor x 3    Joint Mobilization  Supine hip AP glide grade 3    Muscle Energy Technique  hip abduction on the L 1 x 10              PT Education - 01/26/18 1245    Education provided  Yes    Education Details  updated HEP for hip adductor stretching and hip strengthening    Person(s) Educated  Patient    Methods  Explanation;Verbal cues;Handout;Demonstration    Comprehension  Verbalized understanding;Verbal cues required;Returned demonstration       PT Short Term Goals - 01/19/18 1544      PT SHORT TERM GOAL #1   Title  Patient to be compliant with appropriate HEP, to be updated PRN     Time  1    Period  Weeks  Status  New    Target Date  01/26/18        PT Long Term Goals - 01/19/18 1545      PT LONG TERM GOAL #1   Title  Patient to demonstrate improvement of functional strength by at least 1 MMT grade in all tested groups in order to reduce pain and symptoms     Time  4    Period  Weeks    Status  New    Target Date  02/16/18      PT LONG TERM GOAL #2   Title  Patient to demonstrate resolution of L hip adductor spasm in order to reduce pain and improve tolerance to mobility     Time  4    Period  Weeks    Status  New      PT LONG TERM GOAL #3   Title  Patient to demonstrate bilateral hip IR/ER ROM as being at least 30 degrees in order to improve gross mechanics and reduce stress on lumbar spine     Time  4    Period  Weeks    Status  New      PT LONG TERM GOAL #4   Title  Patient to report she has been able to sleep through the night without waking due to pain in order to improve QOL and sleep patterns     Time  4    Period  Weeks    Status  New            Plan - 01/26/18 1241    Clinical Impression Statement  pt reportd only 1/10 in the low back and tightness in the adductor region. Following manual for to release adductor spasm and  strengthening of glute med she reported relief of hip stiffness/ pain. updated HEP for standing hip adductor stretch and sidelying hip abduction strengthening.     PT Next Visit Plan  Review stretching , manual  strength, possible innominate outflare on the L, stretching of adductors and strengthening of glute med    PT Home Exercise Plan  Eval: seated lumbar flexion stretch, seated lumbar rotation stretch, frog stretch supine and prone. Fig 4 IR/ER and piriformis, ITB stretch    Consulted and Agree with Plan of Care  Patient       Patient will benefit from skilled therapeutic intervention in order to improve the following deficits and impairments:  Increased fascial restricitons, Improper body mechanics, Pain, Decreased coordination, Decreased mobility, Increased muscle spasms, Decreased activity tolerance, Decreased strength, Hypomobility, Impaired flexibility  Visit Diagnosis: Chronic bilateral low back pain without sciatica  Muscle weakness (generalized)  Stiffness of left hip, not elsewhere classified     Problem List Patient Active Problem List   Diagnosis Date Noted  . Hyperlipidemia 01/29/2016  . Atypical chest pain 01/29/2016  . Vitamin D deficiency 03/21/2015  . Paraproteinemia 11/12/2014  . Hypercalcemia 10/31/2014  . Hyperparathyroidism (HCC) 10/31/2014  . Osteoporosis 10/31/2014   Tiffany Reyes PT, DPT, LAT, ATC  01/26/18  12:46 PM      [facility] [address] [place], [place], [id number] Phone: [phone]   Fax:  [phone]  Name: Tiffany Reyes MRN: [id number] Date of Birth: 11-27-1950

## 2018-01-29 ENCOUNTER — Ambulatory Visit: Payer: Medicare Other | Admitting: Physical Therapy

## 2018-01-29 ENCOUNTER — Encounter: Payer: Self-pay | Admitting: Physical Therapy

## 2018-01-29 ENCOUNTER — Other Ambulatory Visit: Payer: Self-pay

## 2018-01-29 DIAGNOSIS — G8929 Other chronic pain: Secondary | ICD-10-CM

## 2018-01-29 DIAGNOSIS — M6281 Muscle weakness (generalized): Secondary | ICD-10-CM

## 2018-01-29 DIAGNOSIS — M25652 Stiffness of left hip, not elsewhere classified: Secondary | ICD-10-CM

## 2018-01-29 DIAGNOSIS — M545 Low back pain: Principal | ICD-10-CM

## 2018-01-29 NOTE — Therapy (Signed)
[facility] Outpatient Rehabilitation [facility] [address] [place], [place], [address] Phone: [phone]   Fax:  [phone]  Physical Therapy Treatment  Patient Details  Name: Tiffany Reyes MRN: [id number] Date of Birth: 1951/03/29 Referring Provider: Sheldon Silvan    Encounter Date: 01/29/2018  PT End of Session - 01/29/18 1321    Visit Number  4    Number of Visits  9    Date for PT Re-Evaluation  02/16/18    Authorization Type  [employer] Medicare  (PN at visit 10, KX at visit 15)    Authorization Time Period  01/19/18 to 02/18/18    PT Start Time  1330    PT Stop Time  1414    PT Time Calculation (min)  44 min    Activity Tolerance  Patient tolerated treatment well    Behavior During Therapy  [facility] for tasks assessed/performed       Past Medical History:  Diagnosis Date  . Arthritis   . Hemochromatosis   . Hyperlipidemia   . Osteopenia   . Vitamin D deficiency     Past Surgical History:  Procedure Laterality Date  . BREAST BIOPSY Left   . COLONOSCOPY      There were no vitals filed for this visit.  Subjective Assessment - 01/29/18 1329    Subjective  I did streches last night .  I woke up one time with my inner thigh 3/10 pain keeping me awake,  Not able to get good sleep.  I did an hour walk but I felt very stiffNot sure if I get out of my car the right way    Pertinent History  osteoporosis, Lt foot fx 2015    How long can you sit comfortably?  depending on the chair  can watch a 2 hour movie    How long can you stand comfortably?  difficulty coming to stand.   2/10  30 minutes to 60 minutes     How long can you walk comfortably?  I walked for an hour this morning.  1/10    Patient Stated Goals  be able to sleep without pain, get rid of pain in upper inner thigh     Currently in Pain?  Yes    Pain Score  1     Pain Location  Back    Pain Orientation  Lower    Pain Descriptors / Indicators  Tightness    Pain Type  Chronic pain     Pain Onset  More than a month ago    Pain Frequency  Intermittent                       [facility] Adult PT Treatment/Exercise - 01/29/18 1335      Self-Care   Self-Care  Other Self-Care Comments    Other Self-Care Comments   educated on self myofascial release with tennis ball and foam roller with retrun demo      Lumbar Exercises: Stretches   Passive Hamstring Stretch  2 reps;30 seconds      Lumbar Exercises: Aerobic   Nustep  L5 x 5 min UE/LE      Knee/Hip Exercises: Stretches   Other Knee/Hip Stretches  supine frog and leg extended abduction 30 sec     Other Knee/Hip Stretches  standing adductor stretch 3 x 30 right and left      Knee/Hip Exercises: Machines for Strengthening   Cybex Leg Press  1 plate  2 x 15 bil      Knee/Hip Exercises: Seated   Sit to Sand  10 reps Vc for correct back posture      Knee/Hip Exercises: Supine   Bridges  10 reps;Both;Strengthening;2 sets    Other Supine Knee/Hip Exercises  physio ball hooklying abdominal sets with  bil knee flexion isometric  15 x 2   no pain      Knee/Hip Exercises: Sidelying   Hip ABduction  Strengthening;Left;10 reps;3 sets combined with 10 reps CW/CCW and one with VC forabd, notflex      Manual Therapy   Manual Therapy  Joint mobilization;Soft tissue mobilization;Myofascial release;Passive ROM    Joint Mobilization  Supine hip AP glide grade 3    Soft tissue mobilization  left Adductor with Iastym tool  contract relax     Passive ROM  Left hip scouring to end range in pain freee range    Muscle Energy Technique  hip abduction on the L 1 x 10              PT Education - 01/29/18 1509    Education provided  Yes    Education Details  Pt educated on self myofascial technique and contract relax using foam roller and tennis ball with demo    Person(s) Educated  Patient    Methods  Explanation;Demonstration;Tactile cues;Verbal cues    Comprehension  Verbalized understanding;Returned demonstration        PT Short Term Goals - 01/19/18 1544      PT SHORT TERM GOAL #1   Title  Patient to be compliant with appropriate HEP, to be updated PRN     Time  1    Period  Weeks    Status  New    Target Date  01/26/18        PT Long Term Goals - 01/19/18 1545      PT LONG TERM GOAL #1   Title  Patient to demonstrate improvement of functional strength by at least 1 MMT grade in all tested groups in order to reduce pain and symptoms     Time  4    Period  Weeks    Status  New    Target Date  02/16/18      PT LONG TERM GOAL #2   Title  Patient to demonstrate resolution of L hip adductor spasm in order to reduce pain and improve tolerance to mobility     Time  4    Period  Weeks    Status  New      PT LONG TERM GOAL #3   Title  Patient to demonstrate bilateral hip IR/ER ROM as being at least 30 degrees in order to improve gross mechanics and reduce stress on lumbar spine     Time  4    Period  Weeks    Status  New      PT LONG TERM GOAL #4   Title  Patient to report she has been able to sleep through the night without waking due to pain in order to improve QOL and sleep patterns     Time  4    Period  Weeks    Status  New            Plan - 01/29/18 1507    Clinical Impression Statement  Pt reported 1/10 pain and was able to walk for 1 hour today but feels stiff in hips.  Pt was educated on self myofascial  release techniques/ contract relax using foam roller and tennis ball.  Pt reported no pain at end of session    Rehab Potential  Good    PT Frequency  2x / week    PT Duration  4 weeks    PT Treatment/Interventions  ADLs/Self Care Home Management;Biofeedback;Cryotherapy;Electrical Stimulation;Iontophoresis 4mg /ml Dexamethasone;Moist Heat;Ultrasound;Gait training;Stair training;Functional mobility training;Therapeutic activities;Therapeutic exercise;Balance training;Neuromuscular re-education;Patient/family education;Manual techniques;Passive range of motion;Dry  needling;Taping    PT Next Visit Plan  Review stretching , manual  strength, possible innominate outflare on the L, stretching of adductors and strengthening of glute med    PT Home Exercise Plan  : seated lumbar flexion stretch, seated lumbar rotation stretch, frog stretch supine and prone. Fig 4 IR/ER and piriformis, ITB stretch, abdominal isometric with physioball    Consulted and Agree with Plan of Care  Patient       Patient will benefit from skilled therapeutic intervention in order to improve the following deficits and impairments:  Increased fascial restricitons, Improper body mechanics, Pain, Decreased coordination, Decreased mobility, Increased muscle spasms, Decreased activity tolerance, Decreased strength, Hypomobility, Impaired flexibility  Visit Diagnosis: Chronic bilateral low back pain without sciatica  Muscle weakness (generalized)  Stiffness of left hip, not elsewhere classified     Problem List Patient Active Problem List   Diagnosis Date Noted  . Hyperlipidemia 01/29/2016  . Atypical chest pain 01/29/2016  . Vitamin D deficiency 03/21/2015  . Paraproteinemia 11/12/2014  . Hypercalcemia 10/31/2014  . Hyperparathyroidism (HCC) 10/31/2014  . Osteoporosis 10/31/2014  Garen Lah, PT Certified Exercise Expert for the Aging Adult  01/29/18 3:12 PM Phone: [phone] Fax: [phone]  [facility] Outpatient Rehabilitation [facility] [address] [place], [place], [address] Phone: [phone]   Fax:  [phone]  Name: SHANTIKA BERMEA MRN: [id number] Date of Birth: 1950/09/20

## 2018-02-04 ENCOUNTER — Ambulatory Visit: Payer: Medicare Other | Admitting: Physical Therapy

## 2018-02-04 ENCOUNTER — Encounter: Payer: Self-pay | Admitting: Physical Therapy

## 2018-02-04 DIAGNOSIS — M25652 Stiffness of left hip, not elsewhere classified: Secondary | ICD-10-CM

## 2018-02-04 DIAGNOSIS — M545 Low back pain, unspecified: Secondary | ICD-10-CM

## 2018-02-04 DIAGNOSIS — M6281 Muscle weakness (generalized): Secondary | ICD-10-CM

## 2018-02-04 DIAGNOSIS — G8929 Other chronic pain: Secondary | ICD-10-CM

## 2018-02-04 NOTE — Therapy (Addendum)
[place], [place], [address] Phone: [phone]   Fax:  [phone]  Physical Therapy Treatment / Discharge Summary  Patient Details  Name: Tiffany Reyes MRN: [id number] Date of Birth: [birth year] Referring Provider: Juleen Starr    Encounter Date: 02/04/2018  PT End of Session - 02/04/18 1138    Visit Number  5    Number of Visits  9    Date for PT Re-Evaluation  02/16/18    Authorization Type  [employer] Medicare  (PN at visit 10, KX at visit 15)    PT Start Time  1100    PT Stop Time  1141    PT Time Calculation (min)  41 min    Activity Tolerance  Patient tolerated treatment well    Behavior During Therapy  [facility] for tasks assessed/performed       Past Medical History:  Diagnosis Date  . Arthritis   . Hemochromatosis   . Hyperlipidemia   . Osteopenia   . Vitamin D deficiency     Past Surgical History:  Procedure Laterality Date  . BREAST BIOPSY Left   . COLONOSCOPY      There were no vitals filed for this visit.  Subjective Assessment - 02/04/18 1101    Subjective  " I am doing much better, I am able to sleep better than have the last couple of nights"     Currently in Pain?  Yes    Pain Score  0-No pain    Pain Location  Back    Pain Orientation  Lower    Aggravating Factors   sitting with leg crossed    Pain Relieving Factors  stretching,                        [facility] Adult PT Treatment/Exercise - 02/04/18 1110      Lumbar Exercises: Stretches   Passive Hamstring Stretch  2 reps;30 seconds    Other Lumbar Stretch Exercise  contract/ relax adductor stretch 3  x  30 sec with 10 sec contraction Faber position      Lumbar Exercises: Supine   Pelvic Tilt  1 rep;5 seconds;10 reps tactile cues for proper form pressing back into table    Pelvic Tilt Limitations  cues to pull belly button in    Dead Bug  10 reps keeping back flat with 10 sec hold    Bridge  10  reps with controlled eccentric    Bridge with clamshell  10 reps 2 sets with blue theraband      Knee/Hip Exercises: Standing   Hip Abduction  2 sets;10 reps;Knee straight;Both with red theraband    Hip Extension  2 sets;Both;Stengthening;Knee straight;10 reps with red theraband             PT Education - 02/04/18 1133    Education provided  Yes    Education Details  updated HEP for standing hip strengthening    Person(s) Educated  Patient    Methods  Explanation;Demonstration;Verbal cues    Comprehension  Verbalized understanding;Returned demonstration;Verbal cues required       PT Short Term Goals - 01/19/18 1544      PT SHORT TERM GOAL #1   Title  Patient to be compliant with appropriate HEP, to be updated PRN     Time  1    Period  Weeks    Status  New    Target Date  01/26/18        PT Long Term Goals - 01/19/18 1545      PT LONG TERM GOAL #1   Title  Patient to demonstrate improvement of functional strength by at least 1 MMT grade in all tested groups in order to reduce pain and symptoms     Time  4    Period  Weeks    Status  New    Target Date  02/16/18      PT LONG TERM GOAL #2   Title  Patient to demonstrate resolution of L hip adductor spasm in order to reduce pain and improve tolerance to mobility     Time  4    Period  Weeks    Status  New      PT LONG TERM GOAL #3   Title  Patient to demonstrate bilateral hip IR/ER ROM as being at least 30 degrees in order to improve gross mechanics and reduce stress on lumbar spine     Time  4    Period  Weeks    Status  New      PT LONG TERM GOAL #4   Title  Patient to report she has been able to sleep through the night without waking due to pain in order to improve QOL and sleep patterns     Time  4    Period  Weeks    Status  New            Plan - 02/04/18 1143    Clinical Impression Statement  no pain reported today. continued stretching and focused on hip strengthenging progressing to  standing. She performed all exercises well and discussed modification of stretching of the piriformis and taking breaks with prolonged driving. If she conitnues to do well next visit plan to discharge at that time.     PT Treatment/Interventions  ADLs/Self Care Home Management;Biofeedback;Cryotherapy;Electrical Stimulation;Iontophoresis 45m/ml Dexamethasone;Moist Heat;Ultrasound;Gait training;Stair training;Functional mobility training;Therapeutic activities;Therapeutic exercise;Balance training;Neuromuscular re-education;Patient/family education;Manual techniques;Passive range of motion;Dry needling;Taping    PT Next Visit Plan  if doing well D/C, Review stretching , manual  strength, possible innominate outflare on the L, stretching of adductors and strengthening of glute med    PT Home Exercise Plan  : seated lumbar flexion stretch, seated lumbar rotation stretch, frog stretch supine and prone. Fig 4 IR/ER and piriformis, ITB stretch, abdominal isometric with physioball    Consulted and Agree with Plan of Care  Patient       Patient will benefit from skilled therapeutic intervention in order to improve the following deficits and impairments:  Increased fascial restricitons, Improper body mechanics, Pain, Decreased coordination, Decreased mobility, Increased muscle spasms, Decreased activity tolerance, Decreased strength, Hypomobility, Impaired flexibility  Visit Diagnosis: Chronic bilateral low back pain without sciatica  Muscle weakness (generalized)  Stiffness of left hip, not elsewhere classified     Problem List Patient Active Problem List   Diagnosis Date Noted  . Hyperlipidemia 01/29/2016  . Atypical chest pain 01/29/2016  . Vitamin D deficiency 03/21/2015  . Paraproteinemia 11/12/2014  . Hypercalcemia 10/31/2014  . [place] ([place] 10/31/2014  . Osteoporosis 10/31/2014   KStarr LakePT, DPT, LAT, ATC  02/04/18  11:45 AM      [facility] [place] [address]Phone: [phone]  Fax:  [phone] Name: Tiffany PIPPENGERMRN: [id number] of Birth: [birth year]     PHYSICAL THERAPY DISCHARGE SUMMARY  Visits from  Start of Care: 5  Current functional level related to goals / functional outcomes: See goals   Remaining deficits: unknown   Education / Equipment: HEP  Plan: Patient agrees to discharge.  Patient goals were partially met. Patient is being discharged due to not returning since the last visit.  ?????         Varun Jourdan PT, DPT, LAT, ATC  03/25/18  2:25 PM

## 2018-02-10 ENCOUNTER — Ambulatory Visit: Payer: Medicare Other | Admitting: Physical Therapy

## 2018-02-11 ENCOUNTER — Ambulatory Visit: Payer: Medicare Other | Admitting: Physical Therapy

## 2018-07-03 ENCOUNTER — Encounter: Payer: Self-pay | Admitting: Cardiology

## 2018-08-31 ENCOUNTER — Encounter: Payer: Self-pay | Admitting: Cardiology

## 2018-09-04 ENCOUNTER — Ambulatory Visit: Payer: Medicare Other | Admitting: Cardiology

## 2018-09-04 ENCOUNTER — Encounter: Payer: Self-pay | Admitting: Cardiology

## 2018-09-04 VITALS — BP 129/87 | HR 65 | Ht 62.5 in | Wt 127.8 lb

## 2018-09-04 DIAGNOSIS — R0609 Other forms of dyspnea: Secondary | ICD-10-CM

## 2018-09-04 DIAGNOSIS — Z01818 Encounter for other preprocedural examination: Secondary | ICD-10-CM | POA: Diagnosis not present

## 2018-09-04 DIAGNOSIS — R0789 Other chest pain: Secondary | ICD-10-CM

## 2018-09-04 DIAGNOSIS — E785 Hyperlipidemia, unspecified: Secondary | ICD-10-CM | POA: Diagnosis not present

## 2018-09-04 MED ORDER — METOPROLOL TARTRATE 50 MG PO TABS: 100.0000 mg | ORAL_TABLET | Freq: Once | ORAL | 0 refills | Status: DC

## 2018-09-04 NOTE — Patient Instructions (Addendum)
Medication Instructions:   SEE INSTRUCTION SHEET FOR CCTA  If you need a refill on your cardiac medications before your next appointment, please call your pharmacy.   Lab work: SEE Medical sales representative FOR CCTA  If you have labs (blood work) drawn today and your tests are completely normal, you will receive your results only by: Marland Kitchen MyChart Message (if you have MyChart) OR . A paper copy in the mail If you have any lab test that is abnormal or we need to change your treatment, we will call you to review the results.  Testing/Procedures: WILL SCHEDULE AFTER PRE CERT.-- [facility] Your physician has requested that you have  Coronary cardiac CTA. Cardiac computed tomography (CT) is a painless test that uses an x-ray machine to take clear, detailed pictures of your heart. For further information please visit [email or web address] Please follow instruction sheet as given.    Follow-Up: At [facility], you and your health needs are our priority.  As part of our continuing mission to provide you with exceptional heart care, we have created designated Provider Care Teams.  These Care Teams include your primary Cardiologist (physician) and Advanced Practice Providers (APPs -  Physician Assistants and Nurse Practitioners) who all work together to provide you with the care you need, when you need it. . Your physician recommends that you schedule a follow-up appointment in 2 MONTHS WITH DR HARDING .   Any Other Special Instructions Will Be Listed Below (If Applicable).    INSTRUCTIONS FOR  CORONARY CTA    Please arrive at the [facility] main entrance of [facility] at (30-45 minutes prior to test start time)  [facility] [address] [place], [place] [address] [phone]  Proceed to the [facility] Radiology Department (First Floor).  Please follow these instructions carefully (unless otherwise directed):  PLEASE HAVE LABS - BMP  AT LEAST ONE WEEK  PRIOR TO TEST  On the Night Before the Test: . Drink plenty of water. . Do not consume any caffeinated/decaffeinated beverages or chocolate 12 hours prior to your test. . Do not take any antihistamines 12 hours prior to your test.    On the Day of the Test: . Drink plenty of water. Do not drink any water within one hour of the test. . Do not eat any food 4 hours prior to the test. . You may take your regular medications prior to the test. .  Take 100 mg of lopressor (metoprolol) TWO hour before the test.   After the Test: . Drink plenty of water. . After receiving IV contrast, you may experience a mild flushed feeling. This is normal. . On occasion, you may experience a mild rash up to 24 hours after the test. This is not dangerous. If this occurs, you can take Benadryl 25 mg and increase your fluid intake. . If you experience trouble breathing, this can be serious. If it is severe call 911 IMMEDIATELY. If it is mild, please call our office.

## 2018-09-04 NOTE — Progress Notes (Signed)
PCP: Tiffany Silvan, MD (currently using)  Tiffany Rainier, MD  Clinic Note: Chief Complaint  Patient presents with  . New Patient (Initial Visit)    Feeling of tiredness in the chest  . Chest Pain    Atypical, discomfort.  . Shortness of Breath    Exertional    HPI: Tiffany Reyes is a 68 y.o. female with a PMH significant for interstitial cystitis who presents today  for the evaluation of chest discomfort and shortness of breath at the request of Tiffany Silvan, MD.  BHAKTI Reyes was last seen for consultation back in June 2017 by Dr. Tresa Reyes.  She was noticing nonexertional chest discomfort, but related to stress.  Had become more frequent and lasting longer.  No symptoms while going to the gym.  A GXT was ordered that was read as abnormal, therefore follow-up Myoview performed suggesting false positive GXT with no ischemia or infarction. -->  She was not seen in follow-up after the Myoview.  She was seen by her PCP back on January 20 noted having some chest pressure and dyspnea.  Not usually with exertion.  Also has some jaw and left arm tingling and palpitations.  Referred for cardiology evaluation.  Recent Hospitalizations: None  Studies Personally Reviewed - (if available, images/films reviewed: From Epic Chart or Care Everywhere)  Echo 01/2016: EF 55-60%.  GR 1 DD.  No RWMA.  No significant valvular lesions.  Myoview 02/2016: EF >65%.  Up-sloping ST depression in Inferolateral leads (was initially called + GXT) --> No Ischemia or Infarction..  Normal BP response to exercise.  Interval History: Tiffany Reyes presents today with a litany of complaints and concerns.  Most notably what she has is these intermittent spells of tightness and squeezing in her chest that is often associated with shortness of breath..  She is been having some intermittent rapid heartbeat spells.  Basically her Toprol ran out a little while ago and she has been out of it.  Since restarting her Toprol the  symptoms seem to improved.  She did fine during her holiday festivities with multiple houseguests letter.  However when she went to her urologist on January 2, she had decline 3 flights of stairs (which used to be quite easy) and noted significant dyspnea and fatigue.  Not really associated with any chest tightness though.  When she goes to the grocery store now she gets a little tired and fatigued with some jaw pain as well as left arm pain.  Her left arm felt numb.  Overall the episode lasted less than 10 minutes. She says that the discomfort actually feels better with eating.  But lying down does not make it go away.  She gets short of breath just playing with a cat.  Occasionally feels her chest pounding, but not overly fast.  With these strange episodes she is unsure if she is having a panic attack or if it is true anginal type symptoms.  She says that reducing her caffeine level seems to decrease these episodes but also starting back on her Lexapro helped.  It is important to note that this summer she was walking around the beach with no problems.  Walking up and down hills with no issues.  No shortness of breath or anything.  As late as December, she did fine.  Her mother passed away with complications dementia earlier on in 2019, she has been a little bit stressed out since that.  But now she is more than a bit concerned  and worried because her husband Tiffany Reyes was recently diagnosed with possible Lewy body dementia.  No PND, orthopnea or edema.  No syncope or near syncope.  No TIA or amaurosis fugax. No prolonged rapid irregular heartbeats palpitations. No claudication.  She has not been taking Lipitor for several months, and noted that her labs really were not that different whether she was on or off of the 10 mg Lipitor.  ROS: A comprehensive was performed. Review of Systems  Constitutional: Positive for malaise/fatigue (More easily fatigued). Negative for weight loss.  HENT: Negative  for congestion and nosebleeds.   Respiratory: Negative for cough and wheezing.        Per HPI  Cardiovascular:       HPI  Gastrointestinal: Negative for blood in stool and melena.  Genitourinary: Negative for hematuria.  Musculoskeletal: Negative for falls and myalgias.  Neurological: Positive for dizziness.  Psychiatric/Behavioral: Positive for depression. The patient is nervous/anxious.   All other systems reviewed and are negative.   I have reviewed and (if needed) personally updated the patient's problem list, medications, allergies, past medical and surgical history, social and family history.   Past Medical History:  Diagnosis Date  . Adjustment disorder   . Arthritis   . Hemochromatosis   . Hyperlipidemia   . Hyperparathyroidism (HCC) 09/2016   With hypercalcemia  . Lumbar radiculopathy, chronic   . Osteopenia   . Recurrent UTI   . Vitamin D deficiency     Past Surgical History:  Procedure Laterality Date  . BREAST BIOPSY Left   . COLONOSCOPY    . NM MYOVIEW LTD  02/2016    EF >65%.  Up-sloping ST depression in Inferolateral leads (was initially called + GXT) --> No Ischemia or Infarction..  Normal BP response to exercise.  . TRANSTHORACIC ECHOCARDIOGRAM  01/2016   EF 55-60%.  GR 1 DD.  No RWMA.  No significant valvular lesions.    Current Meds  Medication Sig  . Albuterol Sulfate (PROAIR DIGIHALER IN) Inhale into the lungs as needed.  . Cholecalciferol (VITAMIN D-1000 MAX ST) 25 MCG (1000 UT) tablet Take by mouth 2 (two) times daily.  Marland Kitchen escitalopram (LEXAPRO) 5 MG tablet Take 5 mg by mouth daily.  . Omega-3 Fatty Acids (FISH OIL) 1200 MG CAPS Take 1,200 mg by mouth 2 (two) times daily.   Marland Kitchen omeprazole (PRILOSEC) 40 MG capsule   . trimethoprim (TRIMPEX) 100 MG tablet Take 100 mg by mouth every morning.  . [DISCONTINUED] atorvastatin (LIPITOR) 10 MG tablet Take 10 mg by mouth every evening.   . [DISCONTINUED] Cholecalciferol 2000 UNITS TBDP Take 1 tablet by mouth  2 (two) times daily.   . [DISCONTINUED] ibandronate (BONIVA) 150 MG tablet Take 1 tablet (150 mg total) by mouth every 30 (thirty) days. Take in the morning with a full glass of water, on an empty stomach, and do not take anything else by mouth or lie down for the next 30 min.  . [DISCONTINUED] omeprazole (PRILOSEC) 20 MG capsule Take 1 capsule (20 mg total) by mouth daily.  . [DISCONTINUED] traZODone (DESYREL) 50 MG tablet Take 50 mg by mouth at bedtime as needed for sleep. Reported on 01/23/2016    Allergies  Allergen Reactions  . Ambien [Zolpidem]   . Codeine Nausea Only  . Fluconazole Hives    Veins stood out  . Sulfa Antibiotics Other (See Comments)    Unknown reaction  . Tetanus Toxoids Swelling    August 1976 last tetanus expierenced intense fever.  Swelling in arm.  . Trazodone Other (See Comments)    Hangover  . Excedrin Pm [Diphenhydramine-Apap (Sleep)]   . Penicillins Rash    Has patient had a PCN reaction causing immediate rash, facial/tongue/throat swelling, SOB or lightheadedness with hypotension: Yes Has patient had a PCN reaction causing severe rash involving mucus membranes or skin necrosis: No Has patient had a PCN reaction that required hospitalization No Has patient had a PCN reaction occurring within the last 10 years: No If all of the above answers are "NO", then may proceed with Cephalosporin use.     Social History   Tobacco Use  . Smoking status: Never Smoker  . Smokeless tobacco: Never Used  Substance Use Topics  . Alcohol use: Yes    Alcohol/week: 1.0 standard drinks    Types: 1 Shots of liquor per week    Comment: 1 glass of wine or shot of whiskey a week.  Has cut down on this.  . Drug use: No   Social History   Social History Narrative   Married.  The husband.  No children.   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   Bachelor of Arts degree.   Has not exercised in the gym for over a year, prior to that was very routine.      Formally very active without  issues.   Husband Tiffany Reyes([place]) - recently Dx w/ ? Lewy Body Dementia    family history includes CAD (age of onset: 7580) in her mother; CVA in her father; Hyperlipidemia in her brother, mother, and sister; Kidney failure in her father; Osteoporosis in her father and mother; Stroke (age of onset: 6478) in her mother.  Wt Readings from Last 3 Encounters:  09/04/18 127 lb 12.8 oz (58 kg)  05/02/16 125 lb (56.7 kg)  02/20/16 123 lb (55.8 kg)    PHYSICAL EXAM BP 129/87 (BP Location: Left Arm)   Pulse 65   Ht 5' 2.5" (1.588 m)   Wt 127 lb 12.8 oz (58 kg)   BMI 23.00 kg/m  Physical Exam  Constitutional: She is oriented to person, place, and time. She appears well-developed and well-nourished. No distress.  Well-groomed.  HENT:  Head: Normocephalic and atraumatic.  Mouth/Throat: Oropharynx is clear and moist.  Eyes: Pupils are equal, round, and reactive to light. Conjunctivae and EOM are normal. No scleral icterus.  Neck: Normal range of motion. Neck supple. No hepatojugular reflux and no JVD present. Carotid bruit is not present. No tracheal deviation present. No thyromegaly present.  Cardiovascular: Normal rate, regular rhythm and normal pulses.  No extrasystoles are present. PMI is not displaced. Exam reveals gallop. Exam reveals no friction rub.  No murmur heard. Pulmonary/Chest: Effort normal and breath sounds normal. No respiratory distress. She has no wheezes. She has no rales. She exhibits tenderness (Bilateral sternal).  Abdominal: Soft. Bowel sounds are normal. She exhibits no distension. There is no abdominal tenderness. There is no rebound.  No HSM.  Musculoskeletal: Normal range of motion.        General: No edema.  Lymphadenopathy:    She has no cervical adenopathy.  Neurological: She is alert and oriented to person, place, and time. No cranial nerve deficit.  Skin: Skin is warm and dry. No rash noted. No erythema.  Psychiatric: She has a normal mood and affect. Her behavior is  normal. Judgment and thought content normal.  Vitals reviewed.   Adult ECG Report  Rate: 59 ;  Rhythm: sinus bradycardia, sinus arrhythmia and CRO anterior MI, age  undetermined.  Otherwise normal axis, intervals & durations;   Narrative Interpretation: Borderline EKG   Other studies Reviewed: Additional studies/ records that were reviewed today include:  Recent Labs:   July 03, 2018: BUN/creatinine 13/0.79.  Sodium potassium 137, 4.3.  Normal LFTs.  -  (Jan 2019 Lipids on Lipitor - TC 265, TG 80, HDL 77, LDL 172 Recent Labs: 08/31/2018  Na+ 139, K+ 3.9, Cl- 101, HCO3- 28.4 , BUN 11, Cr 0.9, Glu 89, Ca2+ 10; AST 25, ALT, 25 AlkP 79  CBC: W 6, H/H 12.7/37.3, Plt 224  TC 270, TG 184, HDL 60, LDL 173 (not on Lipitor)   ASSESSMENT / PLAN: Problem List Items Addressed This Visit    Atypical chest pain    She does have family history, albeit not of premature coronary disease, along with hypertension hyperlipidemia.  She is also a former smoker. Need to exclude and get a baseline coronary artery disease evaluation.  Plan: CT Coronary Angiogram with possible FFR.      Relevant Orders   EKG 12-Lead   Basic metabolic panel   CT CORONARY MORPH W/CTA COR W/SCORE W/CA W/CM &/OR WO/CM   CT CORONARY FRACTIONAL FLOW RESERVE DATA PREP   CT CORONARY FRACTIONAL FLOW RESERVE FLUID ANALYSIS   Exertional dyspnea    Relatively new onset of exertional dyspnea.  Hard to tell what this is.  Associated with some atypical chest pain therefore we need to exclude ischemia.  Plan: Coronary CT Angiogram which will allow evaluation of anatomy and physiology.      Relevant Orders   EKG 12-Lead   Basic metabolic panel   CT CORONARY MORPH W/CTA COR W/SCORE W/CA W/CM &/OR WO/CM   CT CORONARY FRACTIONAL FLOW RESERVE DATA PREP   CT CORONARY FRACTIONAL FLOW RESERVE FLUID ANALYSIS   Hyperlipidemia - Primary (Chronic)    Did not seem to have much benefit from Lipitor 10 mg. Need to know what her baseline  cardiovascular risk is.  We therefore evaluated with a coronary CT angiogram. Based on this result, may need to be more aggressive with treatment.       Other Visit Diagnoses    Pre-op testing       Relevant Orders   Basic metabolic panel      I spent a total of 45 minutes with the patient and chart review. >  50% of the time was spent in direct patient consultation.  Tiffany Reyes is a very circumferential talker.  It took quite a bit of time today to really drilled down to her symptoms and concerns.  She had records with previous labs etc. that we reviewed together.  We also reviewed her previous evaluation with Dr. Nicki Guadalajarahomas Kelly. I explained to her the concept of using a CT coronary angiogram as opposed to a nuclear stress test because this gives us baseline anatomy as well as physiology.  Current medicines are reviewed at length with the patient today.  (+/- concerns) should she go back to taking Lipitor The following changes have been made:  Yes take Lipitor and continue Celexa.  Patient Instructions  Medication Instructions:   SEE INSTRUCTION SHEET FOR CCTA  If you need a refill on your cardiac medications before your next appointment, please call your pharmacy.   Lab work: SEE Medical sales representativeNSTRUCTION SHEET FOR CCTA  If you have labs (blood work) drawn today and your tests are completely normal, you will receive your results only by: Marland Kitchen. MyChart Message (if you have MyChart) OR . A paper  copy in the mail If you have any lab test that is abnormal or we need to change your treatment, we will call you to review the results.  Testing/Procedures: WILL SCHEDULE AFTER PRE CERT.-- [facility] Your physician has requested that you have  Coronary cardiac CTA. Cardiac computed tomography (CT) is a painless test that uses an x-ray machine to take clear, detailed pictures of your heart. For further information please visit [email or web address] Please follow instruction sheet as  given.   Follow-Up: At [facility], you and your health needs are our priority.  As part of our continuing mission to provide you with exceptional heart care, we have created designated Provider Care Teams.  These Care Teams include your primary Cardiologist (physician) and Advanced Practice Providers (APPs -  Physician Assistants and Nurse Practitioners) who all work together to provide you with the care you need, when you need it. . Your physician recommends that you schedule a follow-up appointment in 2 MONTHS WITH DR [facility]  Studies Ordered:   Orders Placed This Encounter  Procedures  . CT CORONARY MORPH W/CTA COR W/SCORE W/CA W/CM &/OR WO/CM  . CT CORONARY FRACTIONAL FLOW RESERVE DATA PREP  . CT CORONARY FRACTIONAL FLOW RESERVE FLUID ANALYSIS  . Basic metabolic panel  . EKG 12-Lead      Bryan Lemma, M.D., M.S. Interventional Cardiologist   Pager # [phone] Phone # [phone] [address] [place], [place] [address]   Thank you for choosing [facility] at [facility]!!

## 2018-09-06 ENCOUNTER — Encounter: Payer: Self-pay | Admitting: Cardiology

## 2018-09-06 NOTE — Assessment & Plan Note (Signed)
She does have family history, albeit not of premature coronary disease, along with hypertension hyperlipidemia.  She is also a former smoker. Need to exclude and get a baseline coronary artery disease evaluation.  Plan: CT Coronary Angiogram with possible FFR.

## 2018-09-06 NOTE — Assessment & Plan Note (Addendum)
Relatively new onset of exertional dyspnea.  Hard to tell what this is.  Associated with some atypical chest pain therefore we need to exclude ischemia.  Plan: Coronary CT Angiogram which will allow evaluation of anatomy and physiology.

## 2018-09-06 NOTE — Assessment & Plan Note (Signed)
Did not seem to have much benefit from Lipitor 10 mg. Need to know what her baseline cardiovascular risk is.  We therefore evaluated with a coronary CT angiogram. Based on this result, may need to be more aggressive with treatment.

## 2018-09-21 LAB — BASIC METABOLIC PANEL
BUN / CREAT RATIO: 15 (ref 12–28)
BUN: 13 mg/dL (ref 8–27)
CALCIUM: 10.1 mg/dL (ref 8.7–10.3)
CO2: 25 mmol/L (ref 20–29)
Chloride: 102 mmol/L (ref 96–106)
Creatinine, Ser: 0.89 mg/dL (ref 0.57–1.00)
GFR, EST AFRICAN AMERICAN: 77 mL/min/{1.73_m2} (ref >59)
GFR, EST NON AFRICAN AMERICAN: 67 mL/min/{1.73_m2} (ref >59)
Glucose: 93 mg/dL (ref 65–99)
POTASSIUM: 5.1 mmol/L (ref 3.5–5.2)
Sodium: 139 mmol/L (ref 134–144)

## 2018-09-22 ENCOUNTER — Telehealth (HOSPITAL_COMMUNITY): Payer: Self-pay | Admitting: Emergency Medicine

## 2018-09-22 NOTE — Telephone Encounter (Signed)
Left message on voicemail with name and callback number Donicia Druck RN Navigator Cardiac Imaging [facility] [phone] Office [phone] Cell  

## 2018-09-23 ENCOUNTER — Ambulatory Visit (HOSPITAL_COMMUNITY): Payer: Medicare Other

## 2018-09-24 ENCOUNTER — Ambulatory Visit (HOSPITAL_COMMUNITY)
Admission: RE | Admit: 2018-09-24 | Discharge: 2018-09-24 | Disposition: A | Discharge status: Home / Self Care | Payer: Medicare Other | Source: Ambulatory Visit | Attending: Cardiology | Admitting: Cardiology

## 2018-09-24 ENCOUNTER — Ambulatory Visit (HOSPITAL_COMMUNITY): Admission: RE | Admit: 2018-09-24 | Payer: Medicare Other | Source: Ambulatory Visit

## 2018-09-24 DIAGNOSIS — R0609 Other forms of dyspnea: Secondary | ICD-10-CM

## 2018-09-24 DIAGNOSIS — R0789 Other chest pain: Secondary | ICD-10-CM

## 2018-09-24 MED ORDER — NITROGLYCERIN 0.4 MG SL SUBL
SUBLINGUAL_TABLET | SUBLINGUAL | Status: AC
  Administered 2018-09-24: 0.8 mg via SUBLINGUAL
  Filled 2018-09-24: qty 2

## 2018-09-24 MED ORDER — NITROGLYCERIN 0.4 MG SL SUBL
0.8000 mg | SUBLINGUAL_TABLET | Freq: Once | SUBLINGUAL | Status: AC
  Administered 2018-09-24: 0.8 mg via SUBLINGUAL
  Filled 2018-09-24: qty 25

## 2018-09-24 MED ORDER — IOPAMIDOL (ISOVUE-370) INJECTION 76%
100.0000 mL | Freq: Once | INTRAVENOUS | Status: AC | PRN
  Administered 2018-09-24: 80 mL via INTRAVENOUS

## 2018-11-03 ENCOUNTER — Telehealth: Payer: Self-pay | Admitting: Cardiology

## 2018-11-03 NOTE — Telephone Encounter (Signed)
   Primary Cardiologist:  Bryan Lemma, MD   Patient contacted.  History reviewed.  No symptoms to suggest any unstable cardiac conditions.  Based on discussion, with current pandemic situation, we will be postponing this appointment for Tiffany Reyes with a plan for f/u on an as needed basis.  If symptoms change, she has been instructed to contact our office.    Tobin Chad, RN  11/03/2018 2:59 PM   information faxed to primary Dr  Sheldon Silvan      .

## 2018-11-03 NOTE — Telephone Encounter (Signed)
Telephone call with Ms. Tiffany Reyes to discuss the results of her coronary CT scan being relatively low risk with a coronary calcium score 55 and no evidence of significant obstructive disease on CTA.  As a result, the recommendation would be to be more aggressive with her cardiac risk factors most notably her elevated LDL on her lipid panel.  My recommendation would be that strongly consider starting her on a statin --based on the level of LDL (173 by last check) target being LDL less than 100, made my recommendation would be to start with at least rosuvastatin 20 to 40 mg daily.  I also think that with her low risk cardiac findings, that her chest pain is probably not cardiac in nature and therefore does not necessarily need to follow-up with me in cardiology unless there are more concerns.   She tells me that she is still intermittently having some chest discomfort, but the plans are to proceed with GI evaluation.  I think her hypertension and cholesterol can be managed by her PCP.   Bryan Lemma, MD

## 2018-11-05 ENCOUNTER — Ambulatory Visit: Payer: Medicare Other | Admitting: Cardiology

## 2018-11-10 ENCOUNTER — Telehealth: Payer: Self-pay

## 2018-11-10 NOTE — Telephone Encounter (Signed)
Covid-19 travel screening questions  Have you traveled in the last 14 days? No If yes where?  Do you now or have you had a fever in the last 14 days? No  Do you have any respiratory symptoms of shortness of breath or cough now or in the last 14 days? Yes SOB has been present for a year  Do you have any family members or close contacts with diagnosed or suspected Covid-19? No   Patient advised that if any of these symptoms change she is asked to call [phone] prior to her appt.

## 2018-11-11 ENCOUNTER — Ambulatory Visit (INDEPENDENT_AMBULATORY_CARE_PROVIDER_SITE_OTHER): Payer: Medicare Other | Admitting: Gastroenterology

## 2018-11-11 ENCOUNTER — Encounter: Payer: Self-pay | Admitting: Gastroenterology

## 2018-11-11 ENCOUNTER — Other Ambulatory Visit: Payer: Self-pay

## 2018-11-11 VITALS — BP 124/66 | HR 70 | Temp 96.9°F | Ht 62.5 in | Wt 128.8 lb

## 2018-11-11 DIAGNOSIS — K219 Gastro-esophageal reflux disease without esophagitis: Secondary | ICD-10-CM

## 2018-11-11 DIAGNOSIS — R079 Chest pain, unspecified: Secondary | ICD-10-CM

## 2018-11-11 DIAGNOSIS — Z86010 Personal history of colonic polyps: Secondary | ICD-10-CM

## 2018-11-11 MED ORDER — OMEPRAZOLE 40 MG PO CPDR: 40.0000 mg | DELAYED_RELEASE_CAPSULE | Freq: Two times a day (BID) | ORAL | 11 refills | Status: DC

## 2018-11-11 NOTE — Patient Instructions (Addendum)
Increase your omeprazole 40 mg one tablet by mouth twice daily. A new prescription has been sent to your pharmacy.   Patient advised to avoid spicy, acidic, citrus, chocolate, mints, fruit and fruit juices.  Limit the intake of caffeine, alcohol and Soda.  Don't exercise too soon after eating.  Don't lie down within 3-4 hours of eating.  Elevate the head of your bed.  You have been scheduled for an endoscopy. Please follow written instructions given to you at your visit today. If you use inhalers (even only as needed), please bring them with you on the day of your procedure. Your physician has requested that you go to www.startemmi.com and enter the access code given to you at your visit today. This web site gives a general overview about your procedure. However, you should still follow specific instructions given to you by our office regarding your preparation for the procedure.  Thank you for choosing me and [facility].  Venita Lick. Pleas Koch., MD., Clementeen Graham

## 2018-11-11 NOTE — Progress Notes (Unsigned)
***  Late Entry Note**** Tiffany Reyes met inclusion and exclusion criteria.  The informed consent form, study requirements and expectations were reviewed with the subject and questions and concerns were addressed prior to the signing of the consent form.  The subject verbalized understanding of the trial requirements.  The subject agreed to participate in the [email or web address] trial and signed the informed consent at 12:52 pm on 09/24/18.  The informed consent was obtained prior to performance of any protocol-specific procedures for the subject.  A copy of the signed informed consent was given to the subject and a copy was placed in the subject's medical record.   [place] Assistant

## 2018-11-11 NOTE — Progress Notes (Signed)
History of Present Illness: This is a 68 year old female referred by Tiffany Silvan, MD for the evaluation of chest pain/pressure, epigastric discomfort, intermittent nausea.  She states the symptoms have bothered her on and off since the 1970s on an infrequent basis.  She developed bronchitis a few months ago and her symptoms have worsened since then.  Recently she was started on omeprazole 40 mg daily and her symptoms have substantially improved but have not abated. She notes relief of symptoms with a small snack.  She notes that occasionally stressful situations bring on waves of nausea as well as epigastric and chest discomfort. She underwent evaluation by cardiology for atypical chest pain and shortness of breath. A cardiac CT showed a calcium score of 55 with minimal non-obstructive CAD.  Radiology overread showed no significant extracardiac findings. She relates a history of colon polyps and has had colonoscopies performed by Dr. Madilyn Fireman.  She states the last colonoscopy was about 2 to 3 years ago and she does not recall if polyps were found. Denies weight loss, constipation, diarrhea, change in stool caliber, melena, hematochezia, vomiting, dysphagia.    Allergies  Allergen Reactions  . Ambien [Zolpidem]   . Codeine Nausea Only  . Fluconazole Hives    Veins stood out  . Sulfa Antibiotics Other (See Comments)    Unknown reaction  . Tetanus Toxoids Swelling    August 1976 last tetanus expierenced intense fever. Swelling in arm.  . Trazodone Other (See Comments)    Hangover  . Excedrin Pm [Diphenhydramine-Apap (Sleep)]   . Penicillins Rash    Has patient had a PCN reaction causing immediate rash, facial/tongue/throat swelling, SOB or lightheadedness with hypotension: Yes Has patient had a PCN reaction causing severe rash involving mucus membranes or skin necrosis: No Has patient had a PCN reaction that required hospitalization No Has patient had a PCN reaction occurring within the last  10 years: No If all of the above answers are "NO", then may proceed with Cephalosporin use.    Outpatient Medications Prior to Visit  Medication Sig Dispense Refill  . Albuterol Sulfate (PROAIR DIGIHALER IN) Inhale into the lungs as needed.    . Cholecalciferol (VITAMIN D-1000 MAX ST) 25 MCG (1000 UT) tablet Take by mouth 2 (two) times daily.    Marland Kitchen escitalopram (LEXAPRO) 5 MG tablet Take 2.5 mg by mouth daily.     . metroNIDAZOLE (METROCREAM) 0.75 % cream Apply topically every morning.    Marland Kitchen omeprazole (PRILOSEC) 40 MG capsule     . trimethoprim (TRIMPEX) 100 MG tablet Take 100 mg by mouth every morning.    . metoprolol tartrate (LOPRESSOR) 50 MG tablet Take 2 tablets (100 mg total) by mouth once for 1 dose. TAKE TWO HOUR PRIOR TO  SCHEDULE CARDAIC TEST 1 tablet 0  . Omega-3 Fatty Acids (FISH OIL) 1200 MG CAPS Take 1,200 mg by mouth 2 (two) times daily.      No facility-administered medications prior to visit.    Past Medical History:  Diagnosis Date  . Adjustment disorder   . Arthritis   . GERD (gastroesophageal reflux disease)   . Hemochromatosis   . Hyperlipidemia   . Hyperparathyroidism (HCC) 09/2016   With hypercalcemia  . Lumbar radiculopathy, chronic   . Osteopenia   . Recurrent UTI   . Vitamin D deficiency    Past Surgical History:  Procedure Laterality Date  . BREAST BIOPSY Left   . BREAST LUMPECTOMY  11/1992, 11/1994  . COLONOSCOPY    .  NM MYOVIEW LTD  02/2016    EF >65%.  Up-sloping ST depression in Inferolateral leads (was initially called + GXT) --> No Ischemia or Infarction..  Normal BP response to exercise.  . TRANSTHORACIC ECHOCARDIOGRAM  01/2016   EF 55-60%.  GR 1 DD.  No RWMA.  No significant valvular lesions.   Social History   Socioeconomic History  . Marital status: Married    Spouse name: Not on file  . Number of children: Not on file  . Years of education: Not on file  . Highest education level: Not on file  Occupational History    Employer:  RETIRED  Social Needs  . Financial resource strain: Not on file  . Food insecurity:    Worry: Not on file    Inability: Not on file  . Transportation needs:    Medical: Not on file    Non-medical: Not on file  Tobacco Use  . Smoking status: Never Smoker  . Smokeless tobacco: Never Used  Substance and Sexual Activity  . Alcohol use: Yes    Alcohol/week: 1.0 standard drinks    Types: 1 Shots of liquor per week    Comment: 1 glass of wine or shot of whiskey a week.  Has cut down on this.  . Drug use: No  . Sexual activity: Not on file  Lifestyle  . Physical activity:    Days per week: Not on file    Minutes per session: Not on file  . Stress: Not on file  Relationships  . Social connections:    Talks on phone: Not on file    Gets together: Not on file    Attends religious service: Not on file    Active member of club or organization: Not on file    Attends meetings of clubs or organizations: Not on file    Relationship status: Not on file  Other Topics Concern  . Not on file  Social History Narrative   Married.  The husband.  No children.   Retired Wellsite geologist.   Bachelor of Arts degree.   Has not exercised in the gym for over a year, prior to that was very routine.      Formally very active without issues.   Husband Isac Sarna) - recently Dx w/ ? Lewy Body Dementia   Family History  Problem Relation Age of Onset  . CAD Mother 80       She had an MI. - CABG  . Osteoporosis Mother   . Stroke Mother 79       Multiple  . Hyperlipidemia Mother   . Kidney failure Father        Diet volume overloaded  . CVA Father   . Osteoporosis Father   . Heart disease Father   . Hyperlipidemia Sister   . Hyperlipidemia Brother   . Hypertension Brother   . Diabetes Maternal Grandmother   . Breast cancer Maternal Grandmother   . Breast cancer Paternal Aunt   . Alzheimer's disease Maternal Aunt       Review of Systems: Pertinent positive and negative review of systems were noted  in the above HPI section. All other review of systems were otherwise negative.    Physical Exam: General: Well developed, well nourished, no acute distress Head: Normocephalic and atraumatic Eyes:  sclerae anicteric, EOMI Ears: Normal auditory acuity Mouth: No deformity or lesions Neck: Supple, no masses or thyromegaly Lungs: Clear throughout to auscultation Heart: Regular rate and rhythm; no murmurs, rubs  or bruits Abdomen: Soft, non tender and non distended. No masses, hepatosplenomegaly or hernias noted. Normal Bowel sounds Rectal: Not done Musculoskeletal: Symmetrical with no gross deformities  Skin: No lesions on visible extremities Pulses:  Normal pulses noted Extremities: No clubbing, cyanosis, edema or deformities noted Neurological: Alert oriented x 4, grossly nonfocal Cervical Nodes:  No significant cervical adenopathy Inguinal Nodes: No significant inguinal adenopathy Psychological:  Alert and cooperative. Normal mood and affect   Assessment and Recommendations:  1.  GERD likely explains the majority of her chest pain, epigastric pain and nausea however at times her symptoms appear with increased stress. R/O esophagitis, gastritis, ulcer.  Increase omeprazole from 40 mg daily to 40 mg twice daily taken 30 minutes before breakfast and dinner.  Follow standard antireflux measures.  Tums as needed.  Schedule EGD when COVID-19 restrictions have been relaxed. The risks (including bleeding, perforation, infection, missed lesions, medication reactions and possible hospitalization or surgery if complications occur), benefits, and alternatives to endoscopy with possible biopsy and possible dilation were discussed with the patient and they consent to proceed.   2.  Personal history of adenomatous colon polyps.  She reprots a 5-year interval was recommended for her next colonoscopy.  Request colonoscopy and pathology records from Dr. Madilyn Fireman' office and determine surveillance interval and  timing.   cc: Tiffany Silvan, MD [address] [place], [place] [address]

## 2018-12-14 ENCOUNTER — Encounter: Payer: Self-pay | Admitting: *Deleted

## 2018-12-21 ENCOUNTER — Encounter: Payer: Self-pay | Admitting: Gastroenterology

## 2019-01-03 ENCOUNTER — Telehealth: Payer: Self-pay | Admitting: *Deleted

## 2019-01-03 NOTE — Telephone Encounter (Signed)
Covid-19 travel screening questions  Have you traveled in the last 14 days? No If yes where?  Do you now or have you had a fever in the last 14 days? No  Do you have any respiratory symptoms of shortness of breath or cough now or in the last 14 days? No  Do you have any family members or close contacts with diagnosed or suspected Covid-19? No  Pt aware care partner will wait in the car in the parking lot and she will wear a mask into the building.

## 2019-01-05 ENCOUNTER — Encounter: Payer: Self-pay | Admitting: Gastroenterology

## 2019-01-05 ENCOUNTER — Ambulatory Visit: Payer: Medicare Other | Admitting: Gastroenterology

## 2019-01-05 ENCOUNTER — Other Ambulatory Visit: Payer: Self-pay

## 2019-01-05 VITALS — BP 136/81 | HR 57 | Temp 98.3°F | Ht 62.5 in | Wt 128.0 lb

## 2019-01-05 MED ORDER — SODIUM CHLORIDE 0.9 % IV SOLN: 500.0000 mL | Freq: Once | INTRAVENOUS | Status: DC

## 2019-01-05 NOTE — Progress Notes (Signed)
Pt. Informed upon arrival in admitting that r front tooth has small crack approx. 1/8 of a inch.  Temp-courtney washington-cma  Vital signs-judy branson cma.  Informed  CRNA prior to procedure that pt. Informed me that she had a crack in r front tooth approx. 1/8 inch, Crna assessed pt.oral airway and there was a  additional crack noted in teeth. Pt. Informed of additional crack, option given to do procedure or cancel and reschedule pt. Decided that she would reschedule procedure after she sees her dentist, she will call back to reschedule.

## 2019-01-08 ENCOUNTER — Encounter: Payer: Medicare Other | Admitting: Gastroenterology

## 2019-01-14 ENCOUNTER — Telehealth: Payer: Self-pay | Admitting: *Deleted

## 2019-01-14 ENCOUNTER — Ambulatory Visit: Payer: Medicare Other | Admitting: Allergy

## 2019-01-14 ENCOUNTER — Other Ambulatory Visit: Payer: Self-pay

## 2019-01-14 ENCOUNTER — Encounter: Payer: Self-pay | Admitting: Allergy

## 2019-01-14 VITALS — BP 124/80 | HR 70 | Temp 98.0°F | Resp 16 | Ht 61.81 in | Wt 127.0 lb

## 2019-01-14 DIAGNOSIS — J4540 Moderate persistent asthma, uncomplicated: Secondary | ICD-10-CM

## 2019-01-14 DIAGNOSIS — H1013 Acute atopic conjunctivitis, bilateral: Secondary | ICD-10-CM | POA: Diagnosis not present

## 2019-01-14 DIAGNOSIS — J301 Allergic rhinitis due to pollen: Secondary | ICD-10-CM

## 2019-01-14 DIAGNOSIS — K219 Gastro-esophageal reflux disease without esophagitis: Secondary | ICD-10-CM | POA: Diagnosis not present

## 2019-01-14 MED ORDER — FLUTICASONE PROPIONATE HFA 110 MCG/ACT IN AERO: 2.0000 | INHALATION_SPRAY | Freq: Two times a day (BID) | RESPIRATORY_TRACT | 5 refills | Status: DC

## 2019-01-14 MED ORDER — OLOPATADINE HCL 0.2 % OP SOLN: 1.0000 [drp] | OPHTHALMIC | 5 refills | Status: DC

## 2019-01-14 MED ORDER — ALBUTEROL SULFATE HFA 108 (90 BASE) MCG/ACT IN AERS: 2.0000 | INHALATION_SPRAY | RESPIRATORY_TRACT | 1 refills | Status: DC | PRN

## 2019-01-14 MED ORDER — BECLOMETHASONE DIPROP HFA 80 MCG/ACT IN AERB: 2.0000 | INHALATION_SPRAY | Freq: Two times a day (BID) | RESPIRATORY_TRACT | 5 refills | Status: DC

## 2019-01-14 MED ORDER — MONTELUKAST SODIUM 10 MG PO TABS: 10.0000 mg | ORAL_TABLET | Freq: Every day | ORAL | 5 refills | Status: DC

## 2019-01-14 NOTE — Progress Notes (Signed)
New Patient Note  RE: Tiffany Reyes MRN: [id number] DOB: 10-19-1950 Date of Office Visit: 01/14/2019  Referring provider: Sheldon Silvan, MD Primary care provider: Sheldon Silvan, MD  Chief Complaint: possible asthma  History of present illness: Tiffany Reyes is a 68 y.o. female presenting today for consultation for possible asthma.   In 2019 she had a "really bad case of bronchitis".  She states at the time her mother was dying and was very stressful.  She states she was having coughing, difficulty breathing and would have to lean forward to be able to get a deep breath.  She states she would feel like a "hand was holding pressure" at center of her chest.  She also states she was very fatigued and would need to sit down. She also reports activity would make her feel winded and worsen chest tightness.  She states she was prescribed an albuterol inhaler but she has difficulty taking it properly but when she takes it properly it does work.  She did take singulair for a month and states it helped greatly but she did not get a refill.  Thus she did have return of symptoms as above off singulair.  She still feels daily chest tightness sensation.  She also states that her husband has noticed some wheezing at night however she states that he has dementia thus this may not be an accurate recollection.  Aug 13 2018 she reports having an episode where the chest pressure worsened and she also felt like her arms and jaw were hurting.  She thought she was having a heart attack and called a family member to see what symptoms women have with heart attacks and states she was told she should also have sweating and nausea which she didn't have. Thus didn't feel she was having a heart attack and she went on the grocery shopping and otherwise was ok.   However later on with notifying her PCP of the symptoms she has been having she reports undergoing a cardiac evaluation.  She follows with Dr. Herbie Baltimore in  cardiology.  She had a coronary CT the showed no evidence of significant obstructive disease.  It was recommended that she consider a statin and she is now on atorvastatin.  Since she had a low risk cardiac findings it was less likely that her chest pain symptoms were cardiac related.  Thus she has been advised to follow-up with cardiology as needed.  She also was evaluated by GI for her reflux history.  She was started on omeprazole which she states does help to control her reflux.  She states she has been recommended to perform an endoscopy which she has not had scheduled yet.  From an allergy standpoint she does report nasal congestion and watery eyes with grass exposure.  She does have a history of hives related to ingestion of bran muffins.    Review of systems: Review of Systems  Constitutional: Negative for chills, fever and malaise/fatigue.  HENT: Negative for congestion, ear discharge, nosebleeds, sinus pain and sore throat.   Eyes: Negative for pain, discharge and redness.  Respiratory: Positive for shortness of breath.   Cardiovascular: Positive for chest pain. Negative for palpitations.  Gastrointestinal: Positive for heartburn. Negative for abdominal pain, constipation, diarrhea, nausea and vomiting.  Musculoskeletal: Negative for joint pain.  Skin: Negative for itching and rash.  Neurological: Negative for headaches.    All other systems negative unless noted above in HPI  Past medical history: Past Medical History:  Diagnosis Date  . Adjustment disorder   . Anemia   . Arthritis   . Bronchitis   . Cataract    bilateral  . GERD (gastroesophageal reflux disease)   . Hemochromatosis   . Hyperlipidemia   . Hyperparathyroidism (HCC) 09/2016   With hypercalcemia  . Lumbar radiculopathy, chronic   . Osteopenia   . Recurrent UTI   . Vitamin D deficiency     Past surgical history: Past Surgical History:  Procedure Laterality Date  . BREAST BIOPSY Left   . BREAST  LUMPECTOMY  11/1992, 11/1994  . COLONOSCOPY    . NM MYOVIEW LTD  02/2016    EF >65%.  Up-sloping ST depression in Inferolateral leads (was initially called + GXT) --> No Ischemia or Infarction..  Normal BP response to exercise.  Marland Kitchen. POLYPECTOMY    . TRANSTHORACIC ECHOCARDIOGRAM  01/2016   EF 55-60%.  GR 1 DD.  No RWMA.  No significant valvular lesions.    Family history:  Family History  Problem Relation Age of Onset  . CAD Mother 5080       She had an MI. - CABG  . Osteoporosis Mother   . Stroke Mother 1778       Multiple  . Hyperlipidemia Mother   . Kidney failure Father        Diet volume overloaded  . CVA Father   . Osteoporosis Father   . Heart disease Father   . Hyperlipidemia Sister   . Hyperlipidemia Brother   . Hypertension Brother   . Diabetes Maternal Grandmother   . Breast cancer Maternal Grandmother   . Breast cancer Paternal Aunt   . Alzheimer's disease Maternal Aunt   . Colon cancer Neg Hx   . Rectal cancer Neg Hx   . Stomach cancer Neg Hx   . Pancreatic cancer Neg Hx   . Prostate cancer Neg Hx     Social history: Lives in a home without carpeting with gas heating and central cooling.  Cat inside/outside the home.  She is retired.  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  Denies smoking history.    Medication List: Allergies as of 01/14/2019      Reactions   Ambien [zolpidem]    Alendronate Other (See Comments)   Codeine Nausea Only   Eszopiclone Other (See Comments)   Fluconazole Hives   Veins stood out   Moxifloxacin Hcl Other (See Comments)   Headache, bodyache   Sulfa Antibiotics Other (See Comments)   Unknown reaction   Tetanus Toxoids Swelling   August 1976 last tetanus expierenced intense fever. Swelling in arm.   Trazodone Other (See Comments)   Hangover   Excedrin Pm [diphenhydramine-apap (sleep)]    Penicillins Rash   Has patient had a PCN reaction causing immediate rash, facial/tongue/throat swelling, SOB or  lightheadedness with hypotension: Yes Has patient had a PCN reaction causing severe rash involving mucus membranes or skin necrosis: No Has patient had a PCN reaction that required hospitalization No Has patient had a PCN reaction occurring within the last 10 years: No If all of the above answers are "NO", then may proceed with Cephalosporin use.      Medication List       Accurate as of January 14, 2019  4:59 PM. If you have any questions, ask your nurse or doctor.        STOP taking these medications   PROAIR DIGIHALER IN Replaced by:  albuterol  108 (90 Base) MCG/ACT inhaler You also have another medication with the same name that you need to continue taking as instructed. Stopped by:  Baine Decesare Larose Hires, MD     TAKE these medications   atorvastatin 10 MG tablet Commonly known as:  LIPITOR Take by mouth.   beclomethasone 80 MCG/ACT inhaler Commonly known as:  Qvar RediHaler Inhale 2 puffs into the lungs 2 (two) times daily. Started by:  Capria Cartaya Larose Hires, MD   escitalopram 5 MG tablet Commonly known as:  LEXAPRO Take 2.5 mg by mouth daily.   fluticasone 110 MCG/ACT inhaler Commonly known as:  Flovent HFA Inhale 2 puffs into the lungs 2 (two) times daily. Started by:  Bennye Alm, LPN   metroNIDAZOLE 0.75 % cream Commonly known as:  METROCREAM Apply topically every morning.   montelukast 10 MG tablet Commonly known as:  SINGULAIR Take 1 tablet (10 mg total) by mouth at bedtime. Started by:  Camela Wich Larose Hires, MD   NON FORMULARY HYDROEYE   Olopatadine HCl 0.2 % Soln Commonly known as:  Pataday Place 1 drop into both eyes 1 day or 1 dose. Started by:  Fany Cavanaugh Larose Hires, MD   omeprazole 40 MG capsule Commonly known as:  PRILOSEC Take 1 capsule (40 mg total) by mouth 2 (two) times daily.   PROAIR HFA IN Inhale into the lungs. What changed:    Another medication with the same name was added. Make sure you understand how and when  to take each.  Another medication with the same name was removed. Continue taking this medication, and follow the directions you see here. Changed by:  Zaide Kardell Larose Hires, MD   albuterol 108 (90 Base) MCG/ACT inhaler Commonly known as:  VENTOLIN HFA Inhale 2 puffs into the lungs every 4 (four) hours as needed. What changed:  You were already taking a medication with the same name, and this prescription was added. Make sure you understand how and when to take each. Replaces:  PROAIR DIGIHALER IN Changed by:  Zamire Whitehurst Larose Hires, MD   trimethoprim 100 MG tablet Commonly known as:  TRIMPEX Take 100 mg by mouth every morning.   Vitamin D-1000 Max St 25 MCG (1000 UT) tablet Generic drug:  Cholecalciferol Take by mouth 2 (two) times daily.       Known medication allergies: Allergies  Allergen Reactions  . Ambien [Zolpidem]   . Alendronate Other (See Comments)  . Codeine Nausea Only  . Eszopiclone Other (See Comments)  . Fluconazole Hives    Veins stood out  . Moxifloxacin Hcl Other (See Comments)    Headache, bodyache  . Sulfa Antibiotics Other (See Comments)    Unknown reaction  . Tetanus Toxoids Swelling    August 1976 last tetanus expierenced intense fever. Swelling in arm.  . Trazodone Other (See Comments)    Hangover  . Excedrin Pm [Diphenhydramine-Apap (Sleep)]   . Penicillins Rash    Has patient had a PCN reaction causing immediate rash, facial/tongue/throat swelling, SOB or lightheadedness with hypotension: Yes Has patient had a PCN reaction causing severe rash involving mucus membranes or skin necrosis: No Has patient had a PCN reaction that required hospitalization No Has patient had a PCN reaction occurring within the last 10 years: No If all of the above answers are "NO", then may proceed with Cephalosporin use.      Physical examination: Blood pressure 124/80, pulse 70, temperature 98 F (36.7 C), temperature source Temporal, resp. rate 16,  height 5' 1.81" (1.57  m), weight 127 lb (57.6 kg), SpO2 95 %.  General: Alert, interactive, in no acute distress. HEENT: PERRLA, TMs pearly gray, turbinates minimally edematous without discharge, post-pharynx non erythematous. Neck: Supple without lymphadenopathy. Lungs: Clear to auscultation without wheezing, rhonchi or rales. {no increased work of breathing. CV: Normal S1, S2 without murmurs. Abdomen: Nondistended, nontender. Skin: Warm and dry, without lesions or rashes. Extremities:  No clubbing, cyanosis or edema. Neuro:   Grossly intact.  Diagnositics/Labs: Labs: review of labs from 08/2018 shows unremarkable CBC and CMP  Spirometry: FEV1: 1.78L 88%, FVC: 2.39L 90%, ratio consistent with nonobstructive pattern  Allergy testing: environmental allergy skin prick testing is positive to grass pollens, oak tree pollen, fusarium, dust mites, cat, dog, cockroach. Allergy testing results were read and interpreted by provider, documented by clinical staff.   Assessment and plan:   Asthmatic bronchitis   - lung function today is normal!  - have access to albuterol inhaler 2 puffs every 4-6 hours as needed for cough/wheeze/shortness of breath/chest tightness.  May use 15-20 minutes prior to activity.   Monitor frequency of use.    - resume montelukast 10 mg daily-take at bedtime  - start Qvar 2 puffs twice a day  Control goals:   Full participation in all desired activities (may need albuterol before activity)  Albuterol use two time or less a week on average (not counting use with activity)  Cough interfering with sleep two time or less a month  Oral steroids no more than once a year  No hospitalizations  Allergic rhinitis with conjunctivitis  - environmental allergy skin testing is positive to grass pollens, tree pollens, mold, dust mites, cat, dog, cockroach  - allergen avoidance measures discussed/handouts provided  - for allergy symptom relief recommend use of  long-acting antihistamine like Allegra 180 mg, Zyrtec 10 mg or Xyzal 5 mg  - for watery eyes Pataday 1 drop each eye daily as needed  Reflux  - continue daily omeprazole for control of reflux  - recommend having upper endoscopy performed as per your gastroenterologist to ensure you do not do not have any chronic changes from uncontrolled reflux  Follow-up 3 months or sooner if needed  I appreciate the opportunity to take part in Manette's care. Please do not hesitate to contact me with questions.  Sincerely,   Margo Aye, MD Allergy/Immunology [facility]

## 2019-01-14 NOTE — Telephone Encounter (Signed)
If we have to yes.  flovent 2 puffs twice a day

## 2019-01-14 NOTE — Addendum Note (Signed)
Addended by: Bennye Alm on: 01/14/2019 04:13 PM   Modules accepted: Orders

## 2019-01-14 NOTE — Patient Instructions (Addendum)
Asthmatic bronchitis   - lung function today is normal!  - have access to albuterol inhaler 2 puffs every 4-6 hours as needed for cough/wheeze/shortness of breath/chest tightness.  May use 15-20 minutes prior to activity.   Monitor frequency of use.    - resume montelukast 10 mg daily-take at bedtime  - start Qvar 2 puffs twice a day  Control goals:   Full participation in all desired activities (may need albuterol before activity)  Albuterol use two time or less a week on average (not counting use with activity)  Cough interfering with sleep two time or less a month  Oral steroids no more than once a year  No hospitalizations  Allergies  - environmental allergy skin testing is positive to grass pollens, tree pollens, mold, dust mites, cat, dog, cockroach  - allergen avoidance measures discussed/handouts provided  - for allergy symptom relief recommend use of long-acting antihistamine like Allegra 180 mg, Zyrtec 10 mg or Xyzal 5 mg  - for watery eyes Pataday 1 drop each eye daily as needed  Reflux  - continue daily omeprazole for control of reflux  - recommend having upper endoscopy performed as per your gastroenterologist to ensure you do not do not have any chronic changes from uncontrolled reflux  Follow-up 3 months or sooner if needed

## 2019-01-14 NOTE — Telephone Encounter (Signed)
Dr Delorse Lek Qvar is not preferred can we change to Flovent?

## 2019-01-14 NOTE — Telephone Encounter (Signed)
Patient called back informed of change patient verbalized understanding

## 2019-01-14 NOTE — Telephone Encounter (Signed)
rx for Flovent sent in. Called patient to inform of medication change due to insurance but left voicemail to return call.

## 2019-01-26 ENCOUNTER — Telehealth: Payer: Self-pay | Admitting: Allergy

## 2019-01-26 NOTE — Telephone Encounter (Signed)
Patient received a letter from [employer] and wanted to check with [place] to make sure it was legit. States she wanted to know if this is them advised that if they are asking additional info then yes this could be cause they need to process claim. Patient verbalized understanding and will send info requested

## 2019-01-26 NOTE — Telephone Encounter (Signed)
Patient did receive a nebulizer at last appt

## 2019-01-26 NOTE — Telephone Encounter (Signed)
Please call patient about a bill received due to lack of insurance?? Patient received a nebulizer and now received a bill and is wondering if this is true or if it is a scam Please call

## 2019-02-25 ENCOUNTER — Ambulatory Visit: Payer: Medicare Other | Admitting: *Deleted

## 2019-02-25 ENCOUNTER — Other Ambulatory Visit: Payer: Self-pay

## 2019-02-25 VITALS — Ht 62.5 in | Wt 124.0 lb

## 2019-02-25 DIAGNOSIS — K219 Gastro-esophageal reflux disease without esophagitis: Secondary | ICD-10-CM

## 2019-02-25 NOTE — Progress Notes (Signed)
No egg or soy allergy known to patient  No issues with past sedation with any surgeries  or procedures, no intubation problems  No diet pills per patient No home 02 use per patient  No blood thinners per patient  Pt denies issues with constipation  No A fib or A flutter  EMMI video sent to pt's e mail   Pt verified name, DOB, address and insurance during PV today. Pt mailed instruction packet to included paper to complete and mail back to [facility] with addressed and stamped envelope, Emmi video, copy of consent form to read and not return, and instructions.PV completed over the phone. Pt encouraged to call with questions or issues   Pt is aware that care partner will wait in the car during procedure; if they feel like they will be too hot to wait in the car; they may wait in the lobby.  We want them to wear a mask (we do not have any that we can provide them), practice social distancing, and we will check their temperatures when they get here.  I did remind patient that their care partner needs to stay in the parking lot the entire time. Pt will wear mask into building.  

## 2019-03-08 ENCOUNTER — Telehealth: Payer: Self-pay | Admitting: Gastroenterology

## 2019-03-08 NOTE — Telephone Encounter (Signed)

## 2019-03-09 ENCOUNTER — Ambulatory Visit (AMBULATORY_SURGERY_CENTER): Payer: Medicare Other | Admitting: Gastroenterology

## 2019-03-09 ENCOUNTER — Other Ambulatory Visit: Payer: Self-pay

## 2019-03-09 ENCOUNTER — Encounter: Payer: Self-pay | Admitting: Gastroenterology

## 2019-03-09 VITALS — BP 121/73 | HR 61 | Temp 98.4°F | Resp 10 | Ht 62.0 in | Wt 124.0 lb

## 2019-03-09 DIAGNOSIS — K3189 Other diseases of stomach and duodenum: Secondary | ICD-10-CM

## 2019-03-09 DIAGNOSIS — K219 Gastro-esophageal reflux disease without esophagitis: Secondary | ICD-10-CM | POA: Diagnosis not present

## 2019-03-09 DIAGNOSIS — K317 Polyp of stomach and duodenum: Secondary | ICD-10-CM | POA: Diagnosis not present

## 2019-03-09 DIAGNOSIS — K2970 Gastritis, unspecified, without bleeding: Secondary | ICD-10-CM | POA: Diagnosis not present

## 2019-03-09 DIAGNOSIS — R079 Chest pain, unspecified: Secondary | ICD-10-CM

## 2019-03-09 DIAGNOSIS — R1013 Epigastric pain: Secondary | ICD-10-CM

## 2019-03-09 DIAGNOSIS — K2950 Unspecified chronic gastritis without bleeding: Secondary | ICD-10-CM | POA: Diagnosis not present

## 2019-03-09 MED ORDER — SODIUM CHLORIDE 0.9 % IV SOLN: 500.0000 mL | Freq: Once | INTRAVENOUS | Status: DC

## 2019-03-09 NOTE — Progress Notes (Signed)
Temp per June VS per Courtney 

## 2019-03-09 NOTE — Op Note (Signed)
[place] Patient Name: Tiffany Reyes Procedure Date: 03/09/2019 8:56 AM MRN: [id number] Endoscopist: Ladene Artist , MD Age: 68 Referring MD:  Date of Birth: Jul 04, 1951 Gender: Female Account #: [id number] Procedure:                Upper GI endoscopy Indications:              Epigastric abdominal pain, Gastroesophageal reflux                            disease, Chest pain (non cardiac) Medicines:                Monitored Anesthesia Care Procedure:                Pre-Anesthesia Assessment:                           - Prior to the procedure, a History and Physical                            was performed, and patient medications and                            allergies were reviewed. The patient's tolerance of                            previous anesthesia was also reviewed. The risks                            and benefits of the procedure and the sedation                            options and risks were discussed with the patient.                            All questions were answered, and informed consent                            was obtained. Prior Anticoagulants: The patient has                            taken no previous anticoagulant or antiplatelet                            agents. ASA Grade Assessment: II - A patient with                            mild systemic disease. After reviewing the risks                            and benefits, the patient was deemed in                            satisfactory condition to undergo the procedure.  After obtaining informed consent, the endoscope was                            passed under direct vision. Throughout the                            procedure, the patient's blood pressure, pulse, and                            oxygen saturations were monitored continuously. The                            Endoscope was introduced through the mouth, and                            advanced to the  second part of duodenum. The upper                            GI endoscopy was accomplished without difficulty.                            The patient tolerated the procedure well. Scope In: Scope Out: Findings:                 The esophagus was normal.                           Multiple 3 to 8 mm sessile polyps with no bleeding                            and no stigmata of recent bleeding were found in                            the gastric fundus and in the gastric body.                            Biopsies were taken with a cold forceps for                            histology.                           The exam of the stomach was otherwise normal.                           The examined duodenum was normal. Complications:            No immediate complications. Estimated Blood Loss:     Estimated blood loss: none. Impression:               - Normal esophagus.                           - Multiple gastric polyps. Biopsied.                           -  Normal examined duodenum. Recommendation:           - Patient has a contact number available for                            emergencies. The signs and symptoms of potential                            delayed complications were discussed with the                            patient. Return to normal activities tomorrow.                            Written discharge instructions were provided to the                            patient.                           - Resume previous diet.                           - Antireflux measures long term.                           - Continue present medications.                           - Await pathology results. Meryl DareMalcolm T Stark, MD 03/09/2019 9:21:10 AM This report has been signed electronically.

## 2019-03-09 NOTE — Progress Notes (Signed)
Called to room to assist during endoscopic procedure.  Patient ID and intended procedure confirmed with present staff. Received instructions for my participation in the procedure from the performing physician.  

## 2019-03-09 NOTE — Progress Notes (Signed)
PT taken to PACU. Monitors in place. VSS. Report given to RN. 

## 2019-03-09 NOTE — Patient Instructions (Signed)
YOU HAD AN ENDOSCOPIC PROCEDURE TODAY AT [facility]:   Refer to the procedure report that was given to you for any specific questions about what was found during the examination.  If the procedure report does not answer your questions, please call your gastroenterologist to clarify.  If you requested that your care partner not be given the details of your procedure findings, then the procedure report has been included in a sealed envelope for you to review at your convenience later.  YOU SHOULD EXPECT: Some feelings of bloating in the abdomen. Passage of more gas than usual.  Walking can help get rid of the air that was put into your GI tract during the procedure and reduce the bloating. If you had a lower endoscopy (such as a colonoscopy or flexible sigmoidoscopy) you may notice spotting of blood in your stool or on the toilet paper. If you underwent a bowel prep for your procedure, you may not have a normal bowel movement for a few days.  Please Note:  You might notice some irritation and congestion in your nose or some drainage.  This is from the oxygen used during your procedure.  There is no need for concern and it should clear up in a day or so.  SYMPTOMS TO REPORT IMMEDIATELY:   Following upper endoscopy (EGD)  Vomiting of blood or coffee ground material  New chest pain or pain under the shoulder blades  Painful or persistently difficult swallowing  New shortness of breath  Fever of 100F or higher  Black, tarry-looking stools  For urgent or emergent issues, a gastroenterologist can be reached at any hour by calling [phone].   DIET:  We do recommend a small meal at first, but then you may proceed to your regular diet.  Drink plenty of fluids but you should avoid alcoholic beverages for 24 hours.  MEDICATIONS: Continue present medications.  Please see handouts given to you by your recovery including Anti-Reflux Precautions.  ACTIVITY:  You should plan to take  it easy for the rest of today and you should NOT DRIVE or use heavy machinery until tomorrow (because of the sedation medicines used during the test).    FOLLOW UP: Our staff will call the number listed on your records 48-72 hours following your procedure to check on you and address any questions or concerns that you may have regarding the information given to you following your procedure. If we do not reach you, we will leave a message.  We will attempt to reach you two times.  During this call, we will ask if you have developed any symptoms of COVID 19. If you develop any symptoms (ie: fever, flu-like symptoms, shortness of breath, cough etc.) before then, please call [phone].  If you test positive for Covid 19 in the 2 weeks post procedure, please call and report this information to [place].    If any biopsies were taken you will be contacted by phone or by letter within the next 1-3 weeks.  Please call us at [phone] if you have not heard about the biopsies in 3 weeks.   Thank you for allowing [place] to provide for your healthcare needs today.   SIGNATURES/CONFIDENTIALITY: You and/or your care partner have signed paperwork which will be entered into your electronic medical record.  These signatures attest to the fact that that the information above on your After Visit Summary has been reviewed and is understood.  Full responsibility of the confidentiality of this  discharge information lies with you and/or your care-partner. 

## 2019-03-11 ENCOUNTER — Telehealth: Payer: Self-pay

## 2019-03-11 NOTE — Telephone Encounter (Signed)
  Follow up Call-  Call back number 03/09/2019 01/05/2019  Post procedure Call Back phone  # [phone]  Permission to leave phone message Yes Yes  Some recent data might be hidden     Patient questions:  Do you have a fever, pain , or abdominal swelling? No. Pain Score  0 *  Have you tolerated food without any problems? Yes.    Have you been able to return to your normal activities? Yes.    Do you have any questions about your discharge instructions: Diet   No. Medications  No. Follow up visit  No.  Do you have questions or concerns about your Care? No.  Actions: * If pain score is 4 or above: No action needed, pain <4.  1. Have you developed a fever since your procedure? no  2.   Have you had an respiratory symptoms (SOB or cough) since your procedure? no  3.   Have you tested positive for COVID 19 since your procedure no  4.   Have you had any family members/close contacts diagnosed with the COVID 19 since your procedure?  no   If yes to any of these questions please route to Joylene John, RN and Alphonsa Gin, Therapist, sports.

## 2019-03-11 NOTE — Telephone Encounter (Signed)
Follow up call, left message to call if problems or questions, otherwise we will call back this afternoon.

## 2019-03-19 ENCOUNTER — Encounter: Payer: Self-pay | Admitting: Gastroenterology

## 2019-04-13 HISTORY — PX: PARATHYROIDECTOMY: SHX19

## 2019-04-22 ENCOUNTER — Ambulatory Visit: Payer: Medicare Other | Admitting: Allergy

## 2019-04-22 ENCOUNTER — Encounter: Payer: Self-pay | Admitting: Allergy

## 2019-04-22 ENCOUNTER — Other Ambulatory Visit: Payer: Self-pay

## 2019-04-22 VITALS — BP 140/96 | HR 73 | Temp 97.8°F | Resp 16 | Ht 62.0 in

## 2019-04-22 DIAGNOSIS — H1013 Acute atopic conjunctivitis, bilateral: Secondary | ICD-10-CM | POA: Diagnosis not present

## 2019-04-22 DIAGNOSIS — J301 Allergic rhinitis due to pollen: Secondary | ICD-10-CM

## 2019-04-22 DIAGNOSIS — K219 Gastro-esophageal reflux disease without esophagitis: Secondary | ICD-10-CM | POA: Diagnosis not present

## 2019-04-22 DIAGNOSIS — J4540 Moderate persistent asthma, uncomplicated: Secondary | ICD-10-CM

## 2019-04-22 MED ORDER — FLOVENT HFA 110 MCG/ACT IN AERO: 2.0000 | INHALATION_SPRAY | Freq: Two times a day (BID) | RESPIRATORY_TRACT | 5 refills | Status: DC

## 2019-04-22 MED ORDER — ALBUTEROL SULFATE HFA 108 (90 BASE) MCG/ACT IN AERS: 1.0000 | INHALATION_SPRAY | RESPIRATORY_TRACT | 1 refills | Status: DC | PRN

## 2019-04-22 NOTE — Progress Notes (Signed)
Follow-up Note  RE: Tiffany Reyes MRN: [id number] DOB: [birth year] Date of Office Visit: 04/22/2019   History of present illness: Tiffany Reyes is a 68 y.o. female presenting today for follow-up of asthmatic bronchitis, allergic rhinitis with conjunctivitis and reflux.  She was last seen in the office on January 14, 2019 by myself.  At that visit I recommended she resume taking off montelukast as well as starting Flovent 110 mcg 2 puffs twice a day with a spacer.  She has been taking both of these medications as prescribed and states that she has been doing great.  She was states that over the month of August that she did not have any respiratory symptoms.  She states she did try to see how she would be around her cat and she states she tried playing with her cat and she states she did feel some chest tightness where she used her albuterol.  Thus she now knows that her cat is a trigger of respiratory symptoms.  She states since the last visit she is only used her albuterol 3 times.  She denies any nighttime awakenings and has not had any need for ED or urgent care visits or any systemic steroids. With her allergies she states that she has been doing more yard work as her husband has dementia and she is slowly taking over more of the household duties.  She states she has not noticed any increase in allergy symptoms when she is doing yard work. She continues to take omeprazole for control of reflux.  She did have her endoscopies performed and states that she does have polyps but they are noncancerous.  Review of systems: Review of Systems  Constitutional: Negative for chills, fever and malaise/fatigue.  HENT: Negative for congestion, ear discharge, nosebleeds and sore throat.   Eyes: Negative for pain, discharge and redness.  Respiratory: Negative for cough, shortness of breath and wheezing.   Cardiovascular: Negative for chest pain.  Gastrointestinal: Positive for heartburn. Negative for  abdominal pain, constipation, diarrhea, nausea and vomiting.  Musculoskeletal: Negative for joint pain.  Skin: Negative for itching and rash.  Neurological: Negative for headaches.    All other systems negative unless noted above in HPI  Past medical/social/surgical/family history have been reviewed and are unchanged unless specifically indicated below.  No changes  Medication List: Allergies as of 04/22/2019      Reactions   Ambien [zolpidem]    Alendronate Other (See Comments)   Codeine Nausea Only   Eszopiclone Other (See Comments)   Fluconazole Hives   Veins stood out   Moxifloxacin Hcl Other (See Comments)   Headache, bodyache   Sulfa Antibiotics Other (See Comments)   Unknown reaction   Tetanus Toxoids Swelling   August 1976 last tetanus expierenced intense fever. Swelling in arm.and Neck    Trazodone Other (See Comments)   Hangover   Excedrin Pm [diphenhydramine-apap (sleep)]    Penicillins Rash   Has patient had a PCN reaction causing immediate rash, facial/tongue/throat swelling, SOB or lightheadedness with hypotension: Yes Has patient had a PCN reaction causing severe rash involving mucus membranes or skin necrosis: No Has patient had a PCN reaction that required hospitalization No Has patient had a PCN reaction occurring within the last 10 years: No If all of the above answers are "NO", then may proceed with Cephalosporin use.      Medication List       Accurate as of April 22, 2019 12:14 PM. If you have  any questions, ask your nurse or doctor.        atorvastatin 10 MG tablet Commonly known as: LIPITOR Take by mouth.   beclomethasone 80 MCG/ACT inhaler Commonly known as: Qvar RediHaler Inhale 2 puffs into the lungs 2 (two) times daily.   escitalopram 5 MG tablet Commonly known as: LEXAPRO Take 2.5 mg by mouth daily.   fluticasone 110 MCG/ACT inhaler Commonly known as: Flovent HFA Inhale 2 puffs into the lungs 2 (two) times daily.    metroNIDAZOLE 0.75 % cream Commonly known as: METROCREAM Apply topically every morning.   montelukast 10 MG tablet Commonly known as: SINGULAIR Take 1 tablet (10 mg total) by mouth at bedtime.   NON FORMULARY HYDROEYE   Olopatadine HCl 0.2 % Soln Commonly known as: Pataday Place 1 drop into both eyes 1 day or 1 dose.   omeprazole 40 MG capsule Commonly known as: PRILOSEC Take 1 capsule (40 mg total) by mouth 2 (two) times daily.   PROAIR HFA IN Inhale into the lungs.   albuterol 108 (90 Base) MCG/ACT inhaler Commonly known as: VENTOLIN HFA Inhale 2 puffs into the lungs every 4 (four) hours as needed.   trimethoprim 100 MG tablet Commonly known as: TRIMPEX Take 100 mg by mouth every morning.   Vitamin D-1000 Max St 25 MCG (1000 UT) tablet Generic drug: Cholecalciferol Take 2,000 Units by mouth daily.       Known medication allergies: Allergies  Allergen Reactions  . Ambien [Zolpidem]   . Alendronate Other (See Comments)  . Codeine Nausea Only  . Eszopiclone Other (See Comments)  . Fluconazole Hives    Veins stood out  . Moxifloxacin Hcl Other (See Comments)    Headache, bodyache  . Sulfa Antibiotics Other (See Comments)    Unknown reaction  . Tetanus Toxoids Swelling    August 1976 last tetanus expierenced intense fever. Swelling in arm.and Neck   . Trazodone Other (See Comments)    Hangover  . Excedrin Pm [Diphenhydramine-Apap (Sleep)]   . Penicillins Rash    Has patient had a PCN reaction causing immediate rash, facial/tongue/throat swelling, SOB or lightheadedness with hypotension: Yes Has patient had a PCN reaction causing severe rash involving mucus membranes or skin necrosis: No Has patient had a PCN reaction that required hospitalization No Has patient had a PCN reaction occurring within the last 10 years: No If all of the above answers are "NO", then may proceed with Cephalosporin use.      Physical examination: Blood pressure (!) 140/96,  pulse 73, temperature 97.8 F (36.6 C), temperature source Temporal, resp. rate 16, height 5\' 2"  (1.575 m), SpO2 98 %.  General: Alert, interactive, in no acute distress. HEENT: PERRLA, TMs pearly gray, turbinates minimally edematous without discharge, post-pharynx non erythematous. Neck: Supple without lymphadenopathy. Lungs: Clear to auscultation without wheezing, rhonchi or rales. {no increased work of breathing. CV: Normal S1, S2 without murmurs. Abdomen: Nondistended, nontender. Skin: Warm and dry, without lesions or rashes. Extremities:  No clubbing, cyanosis or edema. Neuro:   Grossly intact.  Diagnositics/Labs: None today  Assessment and plan:   Asthmatic bronchitis   - under good control!  - have access to albuterol inhaler 2 puffs every 4-6 hours as needed for cough/wheeze/shortness of breath/chest tightness.  May use 15-20 minutes prior to activity.   Monitor frequency of use.    - continue montelukast 10 mg daily-take at bedtime  - will try to step-down therapy --- decrease to Flovent 110mcg  1 puff twice  a day with spacer.  I have advised her to let us know if she has any increase in symptoms and to return to 2 puffs twice a day.  - recommend morning of your surgery to take 2 puffs of your Flovent  Control goals:   Full participation in all desired activities (may need albuterol before activity)  Albuterol use two time or less a week on average (not counting use with activity)  Cough interfering with sleep two time or less a month  Oral steroids no more than once a year  No hospitalizations  Allergic rhinitis with conjunctivitis  - continue avoidance measures for grass pollens, tree pollens, mold, dust mites, cat, dog, cockroach  - for allergy symptom relief recommend use of long-acting antihistamine like Allegra 180 mg, Zyrtec 10 mg or Xyzal 5 mg as needed  - for watery eyes Pataday 1 drop each eye daily as needed  Reflux  - continue daily omeprazole for  control of reflux  Follow-up 4 months or sooner if needed  I appreciate the opportunity to take part in Tiffany Reyes's care. Please do not hesitate to contact me with questions.  Sincerely,   Margo Aye, MD Allergy/Immunology [facility]

## 2019-04-22 NOTE — Patient Instructions (Addendum)
Asthmatic bronchitis   - under good control!  - have access to albuterol inhaler 2 puffs every 4-6 hours as needed for cough/wheeze/shortness of breath/chest tightness.  May use 15-20 minutes prior to activity.   Monitor frequency of use.    - continue montelukast 10 mg daily-take at bedtime  - will try to step-down therapy --- decrease to Flovent 194mcg  1 puff twice a day with spacer  - recommend morning of your surgery to take 2 puffs of your Flovent  Control goals:   Full participation in all desired activities (may need albuterol before activity)  Albuterol use two time or less a week on average (not counting use with activity)  Cough interfering with sleep two time or less a month  Oral steroids no more than once a year  No hospitalizations  Allergies  - continue avoidance measures for grass pollens, tree pollens, mold, dust mites, cat, dog, cockroach  - for allergy symptom relief recommend use of long-acting antihistamine like Allegra 180 mg, Zyrtec 10 mg or Xyzal 5 mg as needed  - for watery eyes Pataday 1 drop each eye daily as needed  Reflux  - continue daily omeprazole for control of reflux  Follow-up 4 months or sooner if needed

## 2019-04-27 ENCOUNTER — Encounter: Payer: Self-pay | Admitting: *Deleted

## 2019-07-27 ENCOUNTER — Other Ambulatory Visit: Payer: Self-pay | Admitting: Allergy

## 2019-07-28 IMAGING — CT CT HEART MORP W/ CTA COR W/ SCORE W/ CA W/CM &/OR W/O CM
4 of 7 series · 8 of 20 positions shown, 9 images · non-contrast
Comparison: None.

Addendum:
CLINICAL DATA: 67-year-old female with chest pain.

EXAM:
Cardiac/Coronary  CT
TECHNIQUE: The patient was scanned on a [employer] Force scanner.

[Series 6: best diast 75 % · axial · 0.31mm/px · z∈[+1111,+1147]mm · 2 of 273 slices shown, 3 images]
[im 91/273  vessel]
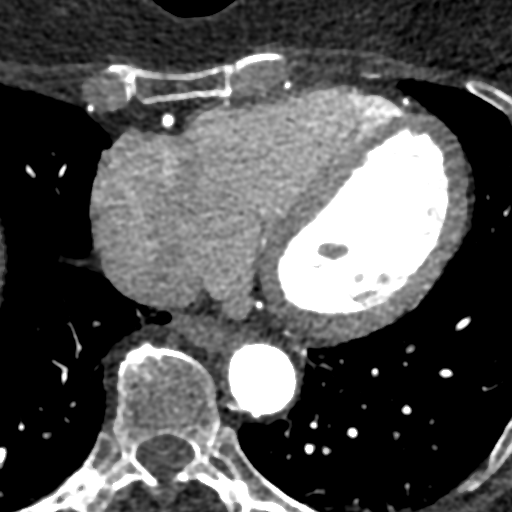
[im 91/273  lung]
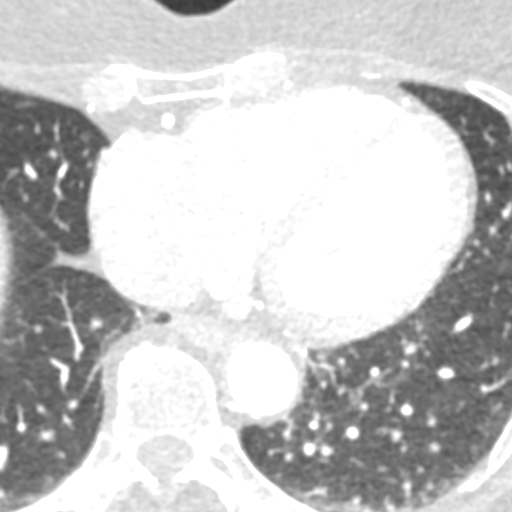
[im 182/273  vessel]
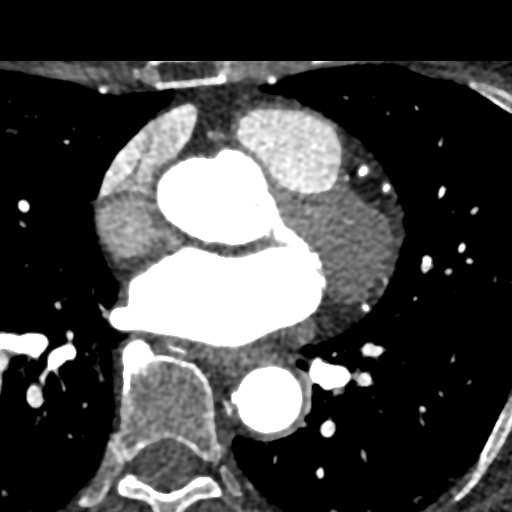

[Series 7: best syst 34 % · axial · 0.31mm/px · z∈[+1111,+1147]mm · 2 of 273 slices shown]
[im 91/273  vessel]
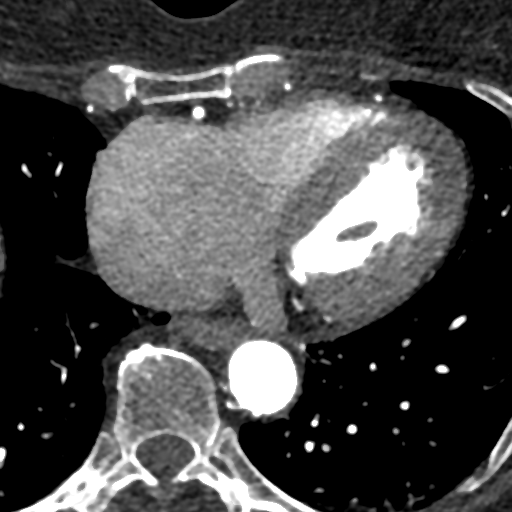
[im 182/273  vessel]
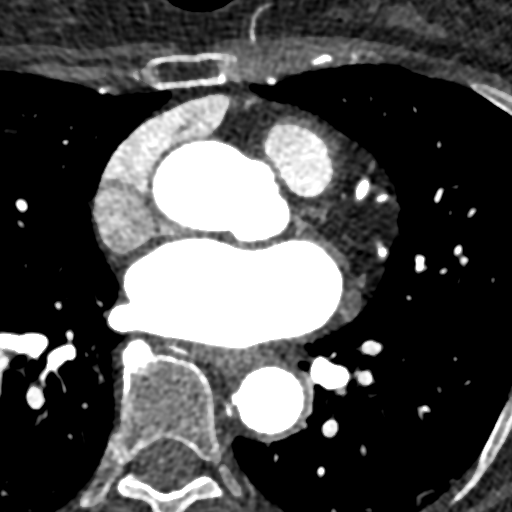

[Series 8: ts diast sharp 75 % · axial · 0.31mm/px · z∈[+1111,+1147]mm · 2 of 273 slices shown]
[im 91/273  lung]
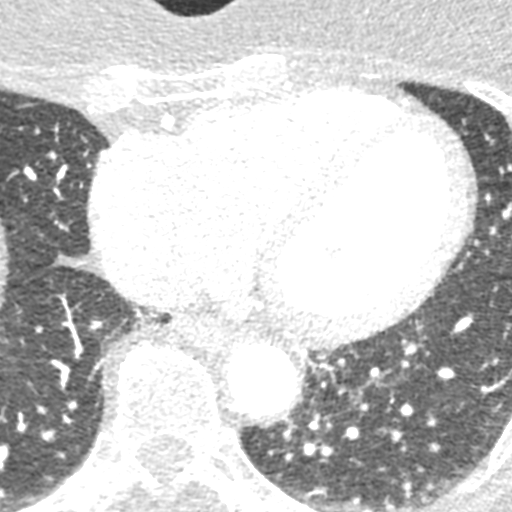
[im 182/273  lung]
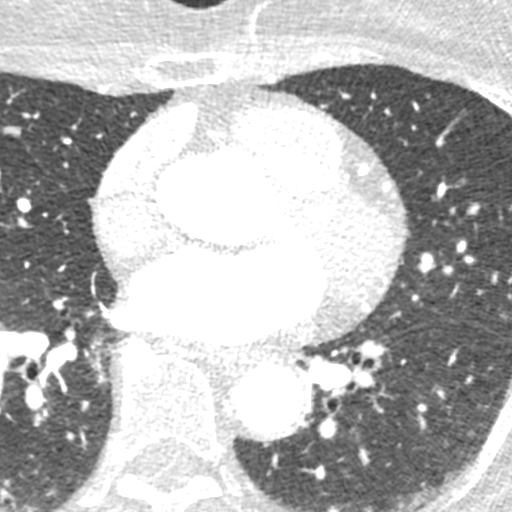

[Series 9: ts syst sharp 34 % · axial · 0.31mm/px · z∈[+1111,+1147]mm · 2 of 273 slices shown]
[im 91/273  lung]
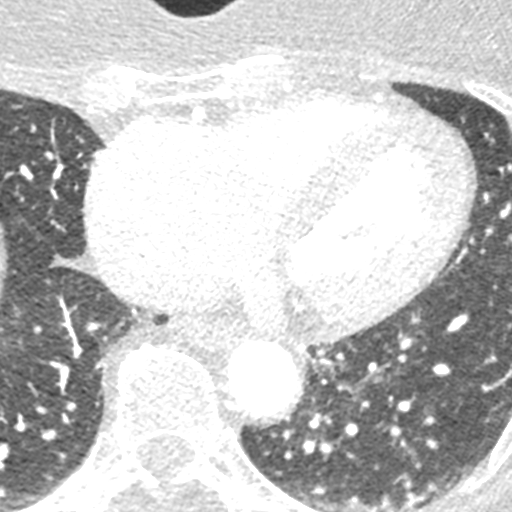
[im 182/273  lung]
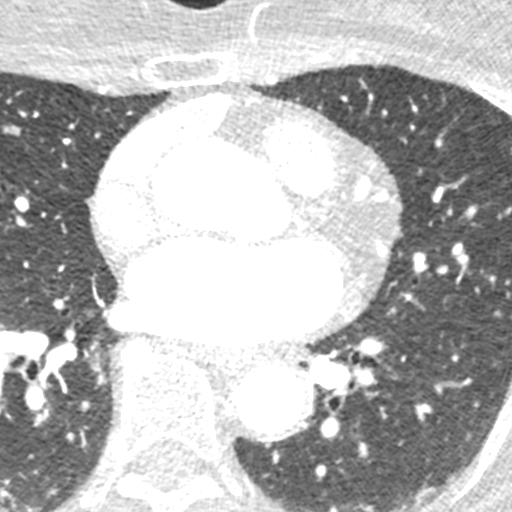

[8 of 20 positions shown; findings below may reference images not displayed]



Aorta:  Normal size.  Trivial calcifications.  No dissection.

Aortic Valve:  Trileaflet.  No calcifications.

Coronary Arteries:  Normal coronary origin.  Right dominance.

RCA is a large dominant artery that gives rise to PDA and PLVB.
There is minimal calcified plaque in the proximal RCA with
associated stenosis 0-25%.

Left main is a large artery that gives rise to LAD, a small ramus
intermedius and LCX arteries. Left main has no plaque.

LAD is a large vessel that gives rise to one diagonal artery and has
mild calcified plaque in the proximal portion with associated
stenosis 0-25%.

D1 is a large sub-branch with no obvious plaque.

RI is a very small artery.

LCX is a non-dominant artery that gives rise to one large OM1
branch. There is no plaque.

Other findings:

Normal pulmonary vein drainage into the left atrium.

Normal let atrial appendage without a thrombus.

Normal size of the pulmonary artery.
IMPRESSION: 1. Coronary calcium score of 55. This was 68 percentile for age and
sex matched control.

2. Normal coronary origin with right dominance.

3. Minimal non-obstructive CAD.  Medical management is recommended.

EXAM:
OVER-READ INTERPRETATION  CT CHEST

The following report is an over-read performed by radiologist Dr.
over-read does not include interpretation of cardiac or coronary
anatomy or pathology. The coronary CTA interpretation by the
cardiologist is attached.
FINDINGS: Limited view of the lung parenchyma demonstrates no suspicious
nodularity. Airways are normal.

Limited view of the mediastinum demonstrates no adenopathy.
Esophagus normal.

Limited view of the upper abdomen unremarkable.

Limited view of the skeleton and chest wall is unremarkable.
IMPRESSION: No significant extracardiac findings.

*** End of Addendum ***

## 2019-08-25 ENCOUNTER — Ambulatory Visit: Payer: Medicare PPO | Admitting: Allergy

## 2019-08-25 ENCOUNTER — Encounter: Payer: Self-pay | Admitting: Allergy

## 2019-08-25 ENCOUNTER — Other Ambulatory Visit: Payer: Self-pay

## 2019-08-25 VITALS — BP 122/78 | HR 75 | Temp 98.0°F | Resp 16 | Ht 62.5 in | Wt 129.2 lb

## 2019-08-25 DIAGNOSIS — J4540 Moderate persistent asthma, uncomplicated: Secondary | ICD-10-CM | POA: Diagnosis not present

## 2019-08-25 DIAGNOSIS — H1013 Acute atopic conjunctivitis, bilateral: Secondary | ICD-10-CM

## 2019-08-25 DIAGNOSIS — J301 Allergic rhinitis due to pollen: Secondary | ICD-10-CM

## 2019-08-25 MED ORDER — FLOVENT HFA 44 MCG/ACT IN AERO: 2.0000 | INHALATION_SPRAY | Freq: Two times a day (BID) | RESPIRATORY_TRACT | 5 refills | Status: DC

## 2019-08-25 NOTE — Patient Instructions (Addendum)
Asthmatic bronchitis   - under good control!  - have access to albuterol inhaler 2 puffs every 4-6 hours as needed for cough/wheeze/shortness of breath/chest tightness.  May use 15-20 minutes prior to activity.   Monitor frequency of use.    - continue montelukast 10 mg daily-take at bedtime  - will try to step-down therapy --- decrease to Flovent  1 puff twice a day with spacer.   If you note any increase in breathing symptoms then go back to Flovent 1 puff twice a day  Control goals:   Full participation in all desired activities (may need albuterol before activity)  Albuterol use two time or less a week on average (not counting use with activity)  Cough interfering with sleep two time or less a month  Oral steroids no more than once a year  No hospitalizations  Allergies  - continue avoidance measures for grass pollens, tree pollens, mold, dust mites, cat, dog, cockroach  - for allergy symptom relief recommend use of long-acting antihistamine like Allegra 180 mg, Zyrtec 10 mg or Xyzal 5 mg as needed  - for watery eyes Pataday 1 drop each eye daily as needed  Reflux  - continue daily omeprazole for control of reflux  Follow-up 4 months or sooner if needed

## 2019-08-25 NOTE — Progress Notes (Signed)
Follow-up Note  RE: Tiffany Reyes MRN: [id number] DOB: 09/26/1950 Date of Office Visit: 08/25/2019   History of present illness: Tiffany Reyes is a 69 y.o. female presenting today for follow-up of asthma, allergic rhinitis with conjunctivitis and reflux.  She was last seen for visit on 04/22/2019 by myself.  She has been well without any major health changes, surgeries or hospitalizations.  She states with menopause she has been having temperature changes where she is hot and cool.  She has noted that she feels a cold sensation along her chest and will increase temp in the house and use more blankets/covers.  She has not needed to use albuterol for this.  She states she did well with decreasing Flovent 131mcg down to 1 puff twice a day without any increase in symptoms or albuterol use.  She continues on singulair daily.  Allergy symptoms have been under control with avoidance however there is a cat in the home that may trigger symptoms at times.  Reflux is controlled with omeprazole.  She also states she has started using Fitbit since Christmas and has been trying to reach step goals.     Review of systems: Review of Systems  Constitutional: Negative.   HENT: Negative.   Eyes: Negative.   Respiratory: Negative.   Cardiovascular: Negative.   Gastrointestinal: Negative.   Musculoskeletal: Negative.   Skin: Negative.   Neurological: Negative.     All other systems negative unless noted above in HPI  Past medical/social/surgical/family history have been reviewed and are unchanged unless specifically indicated below.  No changes  Medication List: Current Outpatient Medications  Medication Sig Dispense Refill  . albuterol (PROAIR HFA) 108 (90 Base) MCG/ACT inhaler Inhale 1-2 puffs into the lungs every 4 (four) hours as needed. 18 g 1  . atorvastatin (LIPITOR) 10 MG tablet Take by mouth.    . Cholecalciferol (VITAMIN D-1000 MAX ST) 25 MCG (1000 UT) tablet Take 2,000 Units by mouth  daily.     Marland Kitchen escitalopram (LEXAPRO) 5 MG tablet Take 2.5 mg by mouth daily.     . fluticasone (FLOVENT HFA) 110 MCG/ACT inhaler Inhale 2 puffs into the lungs 2 (two) times daily. 1 Inhaler 5  . metroNIDAZOLE (METROCREAM) 0.75 % cream Apply topically every morning.    . montelukast (SINGULAIR) 10 MG tablet TAKE ONE TABLET AT BEDTIME. 30 tablet 3  . NON FORMULARY HYDROEYE    . Olopatadine HCl (PATADAY) 0.2 % SOLN Place 1 drop into both eyes 1 day or 1 dose. 1 Bottle 5  . omeprazole (PRILOSEC) 40 MG capsule Take 1 capsule (40 mg total) by mouth 2 (two) times daily. 60 capsule 11  . trimethoprim (TRIMPEX) 100 MG tablet Take 100 mg by mouth every morning.     No current facility-administered medications for this visit.     Known medication allergies: Allergies  Allergen Reactions  . Ambien [Zolpidem]   . Alendronate Other (See Comments)  . Codeine Nausea Only  . Eszopiclone Other (See Comments)  . Fluconazole Hives    Veins stood out  . Moxifloxacin Hcl Other (See Comments)    Headache, bodyache  . Sulfa Antibiotics Other (See Comments)    Unknown reaction  . Tetanus Toxoids Swelling    August 1976 last tetanus expierenced intense fever. Swelling in arm.and Neck   . Trazodone Other (See Comments)    Hangover  . Excedrin Pm [Diphenhydramine-Apap (Sleep)]   . Penicillins Rash    Has patient had a PCN  reaction causing immediate rash, facial/tongue/throat swelling, SOB or lightheadedness with hypotension: Yes Has patient had a PCN reaction causing severe rash involving mucus membranes or skin necrosis: No Has patient had a PCN reaction that required hospitalization No Has patient had a PCN reaction occurring within the last 10 years: No If all of the above answers are "NO", then may proceed with Cephalosporin use.      Physical examination: Blood pressure 122/78, pulse 75, temperature 98 F (36.7 C), temperature source Temporal, resp. rate 16, height 5' 2.5" (1.588 m), weight 129 lb  3.2 oz (58.6 kg), SpO2 97 %.  General: Alert, interactive, in no acute distress. HEENT: PERRLA, TMs pearly gray, turbinates non-edematous without discharge, post-pharynx non erythematous. Neck: Supple without lymphadenopathy. Lungs: Clear to auscultation without wheezing, rhonchi or rales. {no increased work of breathing. CV: Normal S1, S2 without murmurs. Abdomen: Nondistended, nontender. Skin: Warm and dry, without lesions or rashes. Extremities:  No clubbing, cyanosis or edema. Neuro:   Grossly intact.  Diagnositics/Labs: None today  Assessment and plan:   Asthmatic bronchitis   - under good control!  - have access to albuterol inhaler 2 puffs every 4-6 hours as needed for cough/wheeze/shortness of breath/chest tightness.  May use 15-20 minutes prior to activity.   Monitor frequency of use.    - continue montelukast 10 mg daily-take at bedtime  - will try to step-down therapy --- decrease to Flovent  1 puff twice a day with spacer.   If you note any increase in breathing symptoms then go back to Flovent 1 puff twice a day  Control goals:   Full participation in all desired activities (may need albuterol before activity)  Albuterol use two time or less a week on average (not counting use with activity)  Cough interfering with sleep two time or less a month  Oral steroids no more than once a year  No hospitalizations  Allergic rhinitis/conjunctivitis  - continue avoidance measures for grass pollens, tree pollens, mold, dust mites, cat, dog, cockroach  - for allergy symptom relief recommend use of long-acting antihistamine like Allegra 180 mg, Zyrtec 10 mg or Xyzal 5 mg as needed  - for watery eyes Pataday 1 drop each eye daily as needed  Reflux  - continue daily omeprazole for control of reflux  Follow-up 4 months or sooner if needed  I appreciate the opportunity to take part in Tiffany Reyes's care. Please do not hesitate to contact me with  questions.  Sincerely,   Margo Aye, MD Allergy/Immunology [facility]

## 2019-11-22 ENCOUNTER — Other Ambulatory Visit: Payer: Self-pay | Admitting: Allergy

## 2019-12-20 ENCOUNTER — Other Ambulatory Visit: Payer: Self-pay | Admitting: Gastroenterology

## 2019-12-23 ENCOUNTER — Other Ambulatory Visit: Payer: Self-pay

## 2019-12-23 ENCOUNTER — Encounter: Payer: Self-pay | Admitting: Allergy

## 2019-12-23 ENCOUNTER — Ambulatory Visit: Payer: Medicare PPO | Admitting: Allergy

## 2019-12-23 VITALS — BP 110/80 | HR 69 | Temp 97.7°F | Resp 16

## 2019-12-23 DIAGNOSIS — J4540 Moderate persistent asthma, uncomplicated: Secondary | ICD-10-CM

## 2019-12-23 DIAGNOSIS — H1013 Acute atopic conjunctivitis, bilateral: Secondary | ICD-10-CM

## 2019-12-23 DIAGNOSIS — K219 Gastro-esophageal reflux disease without esophagitis: Secondary | ICD-10-CM

## 2019-12-23 DIAGNOSIS — J301 Allergic rhinitis due to pollen: Secondary | ICD-10-CM

## 2019-12-23 NOTE — Patient Instructions (Addendum)
Asthmatic bronchitis   - under good control!  - have access to albuterol inhaler 2 puffs every 4-6 hours as needed for cough/wheeze/shortness of breath/chest tightness.  May use 15-20 minutes prior to activity.   Monitor frequency of use.    - continue montelukast 10 mg daily-take at bedtime  - continue Flovent  1 puff twice a day with spacer.   Control goals:   Full participation in all desired activities (may need albuterol before activity)  Albuterol use two time or less a week on average (not counting use with activity)  Cough interfering with sleep two time or less a month  Oral steroids no more than once a year  No hospitalizations  Allergies  - continue avoidance measures for grass pollens, tree pollens, mold, dust mites, cat, dog, cockroach  - for allergy symptom relief recommend use of long-acting antihistamine like Allegra 180 mg, Zyrtec 10 mg or Xyzal 5 mg as needed  - for watery eyes Pataday 1 drop each eye daily as needed  Reflux  - continue daily omeprazole for control of reflux  Follow-up 6 months or sooner if needed

## 2019-12-23 NOTE — Progress Notes (Signed)
Follow-up Note  RE: Tiffany Reyes MRN: [id number] DOB: 1950-10-02 Date of Office Visit: 12/23/2019   History of present illness: Tiffany Reyes is a 69 y.o. female presenting today for follow-up of asthmatic bronchitis, allergic rhinitis with conjunctivitis and reflux.  She was last seen in the office on August 25, 2019 by myself.  She has done well since her last visit without any major health changes, surgeries or hospitalizations.  She states she has been walking at least 4-5 times of week about 3 miles she states she has not had any issues with the walking.  We did decrease her Flovent last visit to the 44 mcg inhaler and recommended she take 1 puff twice a day which she has been doing.  She states on some days she feels like she does not get the full puff in and may do an additional puff.  She does continue to take singular daily.  She states since the last visit she has only needed to use her albuterol about 3 times.  She states in March she had to bring her cat inside and the cat litter and this did not cause any effects.  The cat has since died.  In regards to her environmental allergies she states she has not needed to use any antihistamines.  She does use the Pataday on most nights to help with ocular symptoms that typically occur mostly in the mornings.  She also continues to take omeprazole however she ran out over the past several days.     Review of systems: Review of Systems  Constitutional: Negative.   HENT: Negative.   Eyes: Negative.   Respiratory: Negative.   Cardiovascular: Negative.   Gastrointestinal: Negative.   Musculoskeletal: Negative.   Skin: Negative.   Neurological: Negative.     All other systems negative unless noted above in HPI  Past medical/social/surgical/family history have been reviewed and are unchanged unless specifically indicated below.  No changes  Medication List: Current Outpatient Medications  Medication Sig Dispense Refill  .  albuterol (PROAIR HFA) 108 (90 Base) MCG/ACT inhaler Inhale 1-2 puffs into the lungs every 4 (four) hours as needed. 18 g 1  . atorvastatin (LIPITOR) 10 MG tablet Take by mouth.    . Cholecalciferol (VITAMIN D-1000 MAX ST) 25 MCG (1000 UT) tablet Take 2,000 Units by mouth daily.     Marland Kitchen escitalopram (LEXAPRO) 5 MG tablet Take 2.5 mg by mouth daily.     . fluticasone (FLOVENT HFA) 110 MCG/ACT inhaler Inhale 2 puffs into the lungs 2 (two) times daily. (Patient taking differently: Inhale 1 puff into the lungs daily at 6 (six) AM. ) 1 Inhaler 5  . metroNIDAZOLE (METROCREAM) 0.75 % cream Apply topically every morning.    . montelukast (SINGULAIR) 10 MG tablet TAKE ONE TABLET AT BEDTIME. 30 tablet 0  . NON FORMULARY HYDROEYE    . Olopatadine HCl (PATADAY) 0.2 % SOLN Place 1 drop into both eyes 1 day or 1 dose. 1 Bottle 5  . omeprazole (PRILOSEC) 40 MG capsule TAKE (1) CAPSULE TWICE DAILY. 60 capsule 1  . trimethoprim (TRIMPEX) 100 MG tablet Take 100 mg by mouth every morning.     No current facility-administered medications for this visit.     Known medication allergies: Allergies  Allergen Reactions  . Ambien [Zolpidem]   . Alendronate Other (See Comments)  . Codeine Nausea Only  . Eszopiclone Other (See Comments)  . Fluconazole Hives    Veins stood out  .  Moxifloxacin Hcl Other (See Comments)    Headache, bodyache  . Sulfa Antibiotics Other (See Comments)    Unknown reaction  . Tetanus Toxoids Swelling    August 1976 last tetanus expierenced intense fever. Swelling in arm.and Neck   . Trazodone Other (See Comments)    Hangover  . Excedrin Pm [Diphenhydramine-Apap (Sleep)]   . Penicillins Rash    Has patient had a PCN reaction causing immediate rash, facial/tongue/throat swelling, SOB or lightheadedness with hypotension: Yes Has patient had a PCN reaction causing severe rash involving mucus membranes or skin necrosis: No Has patient had a PCN reaction that required hospitalization  No Has patient had a PCN reaction occurring within the last 10 years: No If all of the above answers are "NO", then may proceed with Cephalosporin use.      Physical examination: Blood pressure 110/80, pulse 69, temperature 97.7 F (36.5 C), temperature source Temporal, resp. rate 16, SpO2 99 %.  General: Alert, interactive, in no acute distress. HEENT: PERRLA, TMs pearly gray, turbinates non-edematous without discharge, post-pharynx non erythematous. Neck: Supple without lymphadenopathy. Lungs: Clear to auscultation without wheezing, rhonchi or rales. {no increased work of breathing. CV: Normal S1, S2 without murmurs. Abdomen: Nondistended, nontender. Skin: Warm and dry, without lesions or rashes. Extremities:  No clubbing, cyanosis or edema. Neuro:   Grossly intact.  Diagnositics/Labs: Spirometry: FEV1: 1.8L 87%, FVC: 2.23L 82%, ratio consistent with nonobstructive pattern  Assessment and plan:   Asthmatic bronchitis   - under good control!  - have access to albuterol inhaler 2 puffs every 4-6 hours as needed for cough/wheeze/shortness of breath/chest tightness.  May use 15-20 minutes prior to activity.   Monitor frequency of use.    - continue montelukast 10 mg daily-take at bedtime  - continue Flovent  1 puff twice a day with spacer.   Control goals:   Full participation in all desired activities (may need albuterol before activity)  Albuterol use two time or less a week on average (not counting use with activity)  Cough interfering with sleep two time or less a month  Oral steroids no more than once a year  No hospitalizations  Allergic rhinitis with conjuntivitis  - continue avoidance measures for grass pollens, tree pollens, mold, dust mites, cat, dog, cockroach  - for allergy symptom relief recommend use of long-acting antihistamine like Allegra 180 mg, Zyrtec 10 mg or Xyzal 5 mg as needed  - for watery eyes Pataday 1 drop each eye daily as  needed  Reflux  - continue daily omeprazole for control of reflux  Follow-up 6 months or sooner if needed   I appreciate the opportunity to take part in Christyna's care. Please do not hesitate to contact me with questions.  Sincerely,   Margo Aye, MD Allergy/Immunology [facility] [place]

## 2019-12-26 ENCOUNTER — Other Ambulatory Visit: Payer: Self-pay | Admitting: Allergy

## 2020-04-14 ENCOUNTER — Other Ambulatory Visit: Payer: Self-pay | Admitting: Gastroenterology

## 2020-06-12 DIAGNOSIS — M25531 Pain in right wrist: Secondary | ICD-10-CM | POA: Diagnosis not present

## 2020-06-12 HISTORY — PX: WRIST FRACTURE SURGERY: SHX121

## 2020-06-13 ENCOUNTER — Other Ambulatory Visit: Payer: Self-pay | Admitting: Gastroenterology

## 2020-06-15 DIAGNOSIS — S52571A Other intraarticular fracture of lower end of right radius, initial encounter for closed fracture: Secondary | ICD-10-CM | POA: Diagnosis not present

## 2020-06-15 DIAGNOSIS — S52501A Unspecified fracture of the lower end of right radius, initial encounter for closed fracture: Secondary | ICD-10-CM | POA: Diagnosis not present

## 2020-06-15 DIAGNOSIS — G8918 Other acute postprocedural pain: Secondary | ICD-10-CM | POA: Diagnosis not present

## 2020-06-23 DIAGNOSIS — N3010 Interstitial cystitis (chronic) without hematuria: Secondary | ICD-10-CM | POA: Diagnosis not present

## 2020-06-23 DIAGNOSIS — N390 Urinary tract infection, site not specified: Secondary | ICD-10-CM | POA: Diagnosis not present

## 2020-06-23 DIAGNOSIS — N3941 Urge incontinence: Secondary | ICD-10-CM | POA: Diagnosis not present

## 2020-06-26 DIAGNOSIS — S52501D Unspecified fracture of the lower end of right radius, subsequent encounter for closed fracture with routine healing: Secondary | ICD-10-CM | POA: Diagnosis not present

## 2020-06-29 ENCOUNTER — Encounter: Payer: Self-pay | Admitting: Allergy

## 2020-06-29 ENCOUNTER — Other Ambulatory Visit: Payer: Self-pay

## 2020-06-29 ENCOUNTER — Ambulatory Visit: Payer: Medicare PPO | Admitting: Allergy

## 2020-06-29 VITALS — BP 130/80 | HR 76 | Temp 98.0°F | Resp 18 | Ht 61.0 in | Wt 127.0 lb

## 2020-06-29 DIAGNOSIS — K219 Gastro-esophageal reflux disease without esophagitis: Secondary | ICD-10-CM

## 2020-06-29 DIAGNOSIS — J4540 Moderate persistent asthma, uncomplicated: Secondary | ICD-10-CM | POA: Diagnosis not present

## 2020-06-29 DIAGNOSIS — H1013 Acute atopic conjunctivitis, bilateral: Secondary | ICD-10-CM

## 2020-06-29 DIAGNOSIS — J301 Allergic rhinitis due to pollen: Secondary | ICD-10-CM

## 2020-06-29 NOTE — Patient Instructions (Addendum)
Asthmatic bronchitis   - under good control!  - have access to albuterol inhaler 2 puffs every 4-6 hours as needed for cough/wheeze/shortness of breath/chest tightness.  May use 15-20 minutes prior to activity.   Monitor frequency of use.    - continue montelukast 10 mg daily-take at bedtime  - continue Flovent  1 puff twice a day with spacer.   - if control remains good at next visit then will trial off Flovent  Control goals:   Full participation in all desired activities (may need albuterol before activity)  Albuterol use two time or less a week on average (not counting use with activity)  Cough interfering with sleep two time or less a month  Oral steroids no more than once a year  No hospitalizations  Allergies  - continue avoidance measures for grass pollens, tree pollens, mold, dust mites, cat, dog, cockroach  - for allergy symptom relief recommend use of long-acting antihistamine like Allegra 180 mg, Zyrtec 10 mg or Xyzal 5 mg as needed  - for watery eyes Pataday 1 drop each eye daily as needed  Reflux  - continue daily omeprazole for control of reflux  Follow-up 3-4 months or sooner if needed

## 2020-06-29 NOTE — Progress Notes (Signed)
Follow-up Note  RE: Tiffany Reyes MRN: [id number] DOB: [birth year] Date of Office Visit: 06/29/2020   History of present illness: Tiffany Reyes is a 69 y.o. female presenting today for follow-up of asthma, allergies and reflux.  She was last seen in the office on 12/23/2019 by myself.  She states she is doing well today with no major complaints.  She states about a week ago she had some chest tightness but it was not severe enough to require bronchodilator use.  She continues to take her Flovent 44 mcg 2 puffs once a day and singular daily.  She has not noted any increase in symptoms.  She states she is happy with how everything has gone with her breathing control.  She denies any nighttime awakenings or systemic steroids or ED and urgent care visits. Currently with no significant symptoms related to ocular, nasal or generalized allergy symptoms.  She does have access to a long-acting antihistamine as well as Pataday. For reflux control she uses omeprazole. Since her last visit she states her husband who has dementia is now in a full-time care.  She states in the process of getting this all arranged she did break her wrist and required surgical pins and is currently in a wrist brace.  Review of systems: Review of Systems  Constitutional: Negative.   HENT: Negative.   Eyes: Negative.   Respiratory:       See HPI  Cardiovascular: Negative.   Gastrointestinal: Negative.   Musculoskeletal: Negative.   Skin: Negative.   Neurological: Negative.     All other systems negative unless noted above in HPI  Past medical/social/surgical/family history have been reviewed and are unchanged unless specifically indicated below.  No changes  Medication List: Current Outpatient Medications  Medication Sig Dispense Refill  . albuterol (PROAIR HFA) 108 (90 Base) MCG/ACT inhaler Inhale 1-2 puffs into the lungs every 4 (four) hours as needed. 18 g 1  . atorvastatin (LIPITOR) 10 MG tablet Take by  mouth.    . Cholecalciferol (VITAMIN D-1000 MAX ST) 25 MCG (1000 UT) tablet Take 2,000 Units by mouth daily.     Marland Kitchen escitalopram (LEXAPRO) 5 MG tablet Take 2.5 mg by mouth daily.     . fluticasone (FLOVENT HFA) 110 MCG/ACT inhaler Inhale 2 puffs into the lungs 2 (two) times daily. (Patient taking differently: Inhale 1 puff into the lungs daily at 6 (six) AM. ) 1 Inhaler 5  . metroNIDAZOLE (METROCREAM) 0.75 % cream Apply topically every morning.    . montelukast (SINGULAIR) 10 MG tablet TAKE ONE TABLET AT BEDTIME. 30 tablet 5  . NON FORMULARY HYDROEYE    . Olopatadine HCl (PATADAY) 0.2 % SOLN Place 1 drop into both eyes 1 day or 1 dose. 1 Bottle 5  . omeprazole (PRILOSEC) 40 MG capsule TAKE (1) CAPSULE TWICE DAILY. 60 capsule 0  . trimethoprim (TRIMPEX) 100 MG tablet Take 100 mg by mouth every morning.     No current facility-administered medications for this visit.     Known medication allergies: Allergies  Allergen Reactions  . Ambien [Zolpidem]   . Alendronate Other (See Comments)  . Codeine Nausea Only  . Eszopiclone Other (See Comments)  . Fluconazole Hives    Veins stood out  . Moxifloxacin Hcl Other (See Comments)    Headache, bodyache  . Sulfa Antibiotics Other (See Comments)    Unknown reaction  . Tetanus Toxoids Swelling    August 1976 last tetanus expierenced intense fever. Swelling in  arm.and Neck   . Trazodone Other (See Comments)    Hangover  . Excedrin Pm [Diphenhydramine-Apap (Sleep)]   . Penicillins Rash    Has patient had a PCN reaction causing immediate rash, facial/tongue/throat swelling, SOB or lightheadedness with hypotension: Yes Has patient had a PCN reaction causing severe rash involving mucus membranes or skin necrosis: No Has patient had a PCN reaction that required hospitalization No Has patient had a PCN reaction occurring within the last 10 years: No If all of the above answers are "NO", then may proceed with Cephalosporin use.      Physical  examination: Blood pressure 130/80, pulse 76, temperature 98 F (36.7 C), temperature source Temporal, resp. rate 18, height 5\' 1"  (1.549 m), weight 127 lb (57.6 kg), SpO2 98 %.  General: Alert, interactive, in no acute distress. HEENT: PERRLA, TMs pearly gray, turbinates non-edematous without discharge, post-pharynx non erythematous. Neck: Supple without lymphadenopathy. Lungs: Clear to auscultation without wheezing, rhonchi or rales. {no increased work of breathing. CV: Normal S1, S2 without murmurs. Abdomen: Nondistended, nontender. Skin: Warm and dry, without lesions or rashes. Extremities:  No clubbing, cyanosis or edema. Neuro:   Grossly intact.  Diagnositics/Labs: None today  Assessment and plan:   Asthmatic bronchitis   - under good control!  - have access to albuterol inhaler 2 puffs every 4-6 hours as needed for cough/wheeze/shortness of breath/chest tightness.  May use 15-20 minutes prior to activity.   Monitor frequency of use.    - continue montelukast 10 mg daily-take at bedtime  - continue Flovent  1 puff twice a day with spacer.   - if control remains good at next visit then will trial off Flovent  Control goals:   Full participation in all desired activities (may need albuterol before activity)  Albuterol use two time or less a week on average (not counting use with activity)  Cough interfering with sleep two time or less a month  Oral steroids no more than once a year  No hospitalizations  Allergic rhinitis with conjunctivitis  - continue avoidance measures for grass pollens, tree pollens, mold, dust mites, cat, dog, cockroach  - for allergy symptom relief recommend use of long-acting antihistamine like Allegra 180 mg, Zyrtec 10 mg or Xyzal 5 mg as needed  - for watery eyes Pataday 1 drop each eye daily as needed  Reflux  - continue daily omeprazole for control of reflux  Follow-up 3-4 months or sooner if needed I appreciate the opportunity to  take part in Tiffany Reyes's care. Please do not hesitate to contact me with questions.  Sincerely,   , MD Allergy/Immunology [facility]

## 2020-07-18 ENCOUNTER — Other Ambulatory Visit: Payer: Self-pay | Admitting: Allergy

## 2020-07-24 DIAGNOSIS — S52501D Unspecified fracture of the lower end of right radius, subsequent encounter for closed fracture with routine healing: Secondary | ICD-10-CM | POA: Diagnosis not present

## 2020-07-25 DIAGNOSIS — H04123 Dry eye syndrome of bilateral lacrimal glands: Secondary | ICD-10-CM | POA: Diagnosis not present

## 2020-07-25 DIAGNOSIS — H18591 Other hereditary corneal dystrophies, right eye: Secondary | ICD-10-CM | POA: Diagnosis not present

## 2020-07-27 ENCOUNTER — Encounter: Payer: Self-pay | Admitting: Nurse Practitioner

## 2020-07-27 ENCOUNTER — Ambulatory Visit: Payer: Medicare PPO | Admitting: Nurse Practitioner

## 2020-07-27 VITALS — BP 110/60 | HR 80 | Ht 62.25 in | Wt 126.2 lb

## 2020-07-27 DIAGNOSIS — K219 Gastro-esophageal reflux disease without esophagitis: Secondary | ICD-10-CM | POA: Diagnosis not present

## 2020-07-27 MED ORDER — OMEPRAZOLE 40 MG PO CPDR: DELAYED_RELEASE_CAPSULE | ORAL | 3 refills | Status: DC

## 2020-07-27 NOTE — Progress Notes (Signed)
Reviewed and agree with management plan.  Alan Drummer T. Dolorez Jeffrey, MD FACG [facility]  

## 2020-07-27 NOTE — Patient Instructions (Signed)
We will send in Prilosec 40 mg to your pharmacy today  Take the prilosec three times a week, if symptoms persist then you can go back to taking daily    Gastroesophageal Reflux Disease, Adult Gastroesophageal reflux (GER) happens when acid from the stomach flows up into the tube that connects the mouth and the stomach (esophagus). Normally, food travels down the esophagus and stays in the stomach to be digested. However, when a person has GER, food and stomach acid sometimes move back up into the esophagus. If this becomes a more serious problem, the person may be diagnosed with a disease called gastroesophageal reflux disease (GERD). GERD occurs when the reflux:  Happens often.  Causes frequent or severe symptoms.  Causes problems such as damage to the esophagus. When stomach acid comes in contact with the esophagus, the acid may cause soreness (inflammation) in the esophagus. Over time, GERD may create small holes (ulcers) in the lining of the esophagus. What are the causes? This condition is caused by a problem with the muscle between the esophagus and the stomach (lower esophageal sphincter, or LES). Normally, the LES muscle closes after food passes through the esophagus to the stomach. When the LES is weakened or abnormal, it does not close properly, and that allows food and stomach acid to go back up into the esophagus. The LES can be weakened by certain dietary substances, medicines, and medical conditions, including:  Tobacco use.  Pregnancy.  Having a hiatal hernia.  Alcohol use.  Certain foods and beverages, such as coffee, chocolate, onions, and peppermint. What increases the risk? You are more likely to develop this condition if you:  Have an increased body weight.  Have a connective tissue disorder.  Use NSAID medicines. What are the signs or symptoms? Symptoms of this condition include:  Heartburn.  Difficult or painful swallowing.  The feeling of having a lump  in the throat.  Abitter taste in the mouth.  Bad breath.  Having a large amount of saliva.  Having an upset or bloated stomach.  Belching.  Chest pain. Different conditions can cause chest pain. Make sure you see your health care provider if you experience chest pain.  Shortness of breath or wheezing.  Ongoing (chronic) cough or a night-time cough.  Wearing away of tooth enamel.  Weight loss. How is this diagnosed? Your health care provider will take a medical history and perform a physical exam. To determine if you have mild or severe GERD, your health care provider may also monitor how you respond to treatment. You may also have tests, including:  A test to examine your stomach and esophagus with a small camera (endoscopy).  A test thatmeasures the acidity level in your esophagus.  A test thatmeasures how much pressure is on your esophagus.  A barium swallow or modified barium swallow test to show the shape, size, and functioning of your esophagus. How is this treated? The goal of treatment is to help relieve your symptoms and to prevent complications. Treatment for this condition may vary depending on how severe your symptoms are. Your health care provider may recommend:  Changes to your diet.  Medicine.  Surgery. Follow these instructions at home: Eating and drinking   Follow a diet as recommended by your health care provider. This may involve avoiding foods and drinks such as: ? Coffee and tea (with or without caffeine). ? Drinks that containalcohol. ? Energy drinks and sports drinks. ? Carbonated drinks or sodas. ? Chocolate and cocoa. ?  Peppermint and mint flavorings. ? Garlic and onions. ? Horseradish. ? Spicy and acidic foods, including peppers, chili powder, curry powder, vinegar, hot sauces, and barbecue sauce. ? Citrus fruit juices and citrus fruits, such as oranges, lemons, and limes. ? Tomato-based foods, such as red sauce, chili, salsa, and  pizza with red sauce. ? Fried and fatty foods, such as donuts, french fries, potato chips, and high-fat dressings. ? High-fat meats, such as hot dogs and fatty cuts of red and white meats, such as rib eye steak, sausage, ham, and bacon. ? High-fat dairy items, such as whole milk, butter, and cream cheese.  Eat small, frequent meals instead of large meals.  Avoid drinking large amounts of liquid with your meals.  Avoid eating meals during the 2-3 hours before bedtime.  Avoid lying down right after you eat.  Do not exercise right after you eat. Lifestyle   Do not use any products that contain nicotine or tobacco, such as cigarettes, e-cigarettes, and chewing tobacco. If you need help quitting, ask your health care provider.  Try to reduce your stress by using methods such as yoga or meditation. If you need help reducing stress, ask your health care provider.  If you are overweight, reduce your weight to an amount that is healthy for you. Ask your health care provider for guidance about a safe weight loss goal. General instructions  Pay attention to any changes in your symptoms.  Take over-the-counter and prescription medicines only as told by your health care provider. Do not take aspirin, ibuprofen, or other NSAIDs unless your health care provider told you to do so.  Wear loose-fitting clothing. Do not wear anything tight around your waist that causes pressure on your abdomen.  Raise (elevate) the head of your bed about 6 inches (15 cm).  Avoid bending over if this makes your symptoms worse.  Keep all follow-up visits as told by your health care provider. This is important. Contact a health care provider if:  You have: ? New symptoms. ? Unexplained weight loss. ? Difficulty swallowing or it hurts to swallow. ? Wheezing or a persistent cough. ? A hoarse voice.  Your symptoms do not improve with treatment. Get help right away if you:  Have pain in your arms, neck, jaw,  teeth, or back.  Feel sweaty, dizzy, or light-headed.  Have chest pain or shortness of breath.  Vomit and your vomit looks like blood or coffee grounds.  Faint.  Have stool that is bloody or black.  Cannot swallow, drink, or eat. Summary  Gastroesophageal reflux happens when acid from the stomach flows up into the esophagus. GERD is a disease in which the reflux happens often, causes frequent or severe symptoms, or causes problems such as damage to the esophagus.  Treatment for this condition may vary depending on how severe your symptoms are. Your health care provider may recommend diet and lifestyle changes, medicine, or surgery.  Contact a health care provider if you have new or worsening symptoms.  Take over-the-counter and prescription medicines only as told by your health care provider. Do not take aspirin, ibuprofen, or other NSAIDs unless your health care provider told you to do so.  Keep all follow-up visits as told by your health care provider. This is important. This information is not intended to replace advice given to you by your health care provider. Make sure you discuss any questions you have with your health care provider. Document Revised: 02/04/2018 Document Reviewed: 02/04/2018 Elsevier Patient Education  2020 [employer]   If you are age 27 or older, your body mass index should be between 23-30. Your Body mass index is 22.91 kg/m. If this is out of the aforementioned range listed, please consider follow up with your Primary Care Provider.  If you are age 11 or younger, your body mass index should be between 19-25. Your Body mass index is 22.91 kg/m. If this is out of the aformentioned range listed, please consider follow up with your Primary Care Provider.    Due to recent changes in healthcare laws, you may see the results of your imaging and laboratory studies on MyChart before your provider has had a chance to review them.  We understand that in some  cases there may be results that are confusing or concerning to you. Not all laboratory results come back in the same time frame and the provider may be waiting for multiple results in order to interpret others.  Please give [place] 48 hours in order for your provider to thoroughly review all the results before contacting the office for clarification of your results.   I appreciate the  opportunity to care for you  Thank You   Willette Cluster, NP

## 2020-07-27 NOTE — Progress Notes (Signed)
ASSESSMENT AND PLAN    # Chronic GERD, no history of Barrett's esophagus. She ran of PPI a couple of weeks ago and since then has had only one episode of mild heartburn / queasiness.  --At this point she will take Omeprazole TIW. Resume daily dosing for any recurrent GERD symptoms.  --Anti-reflux measure discussed.   # Hx of adenomatous colon polyps. Due for 5 year recall colonoscopy in Nov 2023  HISTORY OF PRESENT ILLNESS     Primary Gastroenterologist :  Claudette Head, MD  Chief Complaint : Refill on Omeprazole  Tiffany Reyes is a 69 y.o. female with PMH / PSH significant for,  but not necessarily limited to: GERD, asthma, hyperlipidemia, adenomatous colon polyps  Patient last seen by Dr. Russella Dar April 2020 for evaluation of GERD symptoms.  PPI was increased to BID. She subsequently underwent an EGD with findings of fundic gland polyps. Gastric biopsies compatible with mild chronic gastritis, no H. Pylori.  Interval History:   Patient is here for refill on Omeprazole. There was some miscommication and she never did increase PPI to twice daily. She ran out of Omeprazole a couple of weeks ago and only once off treatment has she experienced mild heartburn and queasiness. She doesn't want to become symptomatic but doesn't necessarily want to stay of medication if not needed.     Previous Endoscopic Evaluations / Pertinent Studies:  July 2020 EGD for GERD symptoms --gastric polyps.   Surgical [P], stomach, fundus and gastric body - GASTRIC ANTRAL AND OXYNTIC MUCOSA WITH MILD CHRONIC GASTRITIS AND PARIETAL CELL HYPERPLASIA AS CAN BE SEEN IN HYPERGASTRINEMIC STATES SUCH AS PPI THERAPY. - FUNDIC GLAND POLYP(S) - WARTHIN STARRY STAIN IS NEGATIVE FOR HELICOBACTER PYLORI  Past Medical History:  Diagnosis Date  . Adjustment disorder   . Allergy   . Anemia    long time ago per pt.  . Arthritis   . Asthma   . Bronchitis   . Cataract    bilateral  . GERD (gastroesophageal  reflux disease)   . Hemochromatosis    Carrier only  . Hyperlipidemia   . Hyperparathyroidism (HCC) 09/2016   With hypercalcemia  . Lumbar radiculopathy, chronic   . Osteopenia   . Osteoporosis   . Recurrent UTI   . Vitamin D deficiency     Current Medications, Allergies, Past Surgical History, Family History and Social History were reviewed in [employer] record.   Current Outpatient Medications  Medication Sig Dispense Refill  . albuterol (PROAIR HFA) 108 (90 Base) MCG/ACT inhaler Inhale 1-2 puffs into the lungs every 4 (four) hours as needed. 18 g 1  . atorvastatin (LIPITOR) 10 MG tablet Take by mouth.    . Cholecalciferol (VITAMIN D-1000 MAX ST) 25 MCG (1000 UT) tablet Take 2,000 Units by mouth daily.     Marland Kitchen escitalopram (LEXAPRO) 5 MG tablet Take 2.5 mg by mouth daily.     . fluticasone (FLOVENT HFA) 110 MCG/ACT inhaler Inhale 2 puffs into the lungs 2 (two) times daily. (Patient taking differently: Inhale 1 puff into the lungs daily at 6 (six) AM. ) 1 Inhaler 5  . metroNIDAZOLE (METROCREAM) 0.75 % cream Apply topically every morning.    . montelukast (SINGULAIR) 10 MG tablet TAKE ONE TABLET AT BEDTIME. 30 tablet 0  . NON FORMULARY HYDROEYE    . Olopatadine HCl (PATADAY) 0.2 % SOLN Place 1 drop into both eyes 1 day or 1 dose. 1 Bottle 5  . omeprazole (  PRILOSEC) 40 MG capsule TAKE (1) CAPSULE TWICE DAILY. 60 capsule 0  . trimethoprim (TRIMPEX) 100 MG tablet Take 100 mg by mouth every morning.     No current facility-administered medications for this visit.    Review of Systems: No chest pain. No shortness of breath. No urinary complaints.   PHYSICAL EXAM :    Wt Readings from Last 3 Encounters:  06/29/20 127 lb (57.6 kg)  08/25/19 129 lb 3.2 oz (58.6 kg)  03/09/19 124 lb (56.2 kg)    BP 110/60 (BP Location: Left Arm, Patient Position: Sitting, Cuff Size: Normal)   Pulse 80   Ht 5' 2.25" (1.581 m) Comment: height measured without shoes  Wt 126 lb  4 oz (57.3 kg)   BMI 22.91 kg/m  Constitutional:  Pleasant female in no acute distress. Psychiatric: Normal mood and affect. Behavior is normal. EENT: Pupils normal.  Conjunctivae are normal. No scleral icterus. Cardiovascular: Normal rate, regular rhythm. No edema Pulmonary/chest: Effort normal and breath sounds normal. No wheezing, rales or rhonchi. Abdominal: Soft, nondistended, nontender. Bowel sounds active throughout. There are no masses palpable. No hepatomegaly. Neurological: Alert and oriented to person place and time.   Tiffany Cluster, NP  07/27/2020, 9:07 AM

## 2020-07-28 DIAGNOSIS — H18591 Other hereditary corneal dystrophies, right eye: Secondary | ICD-10-CM | POA: Diagnosis not present

## 2020-07-28 DIAGNOSIS — H04123 Dry eye syndrome of bilateral lacrimal glands: Secondary | ICD-10-CM | POA: Diagnosis not present

## 2020-07-28 DIAGNOSIS — M810 Age-related osteoporosis without current pathological fracture: Secondary | ICD-10-CM | POA: Diagnosis not present

## 2020-08-23 DIAGNOSIS — S52501D Unspecified fracture of the lower end of right radius, subsequent encounter for closed fracture with routine healing: Secondary | ICD-10-CM | POA: Diagnosis not present

## 2020-08-31 DIAGNOSIS — H00024 Hordeolum internum left upper eyelid: Secondary | ICD-10-CM | POA: Diagnosis not present

## 2020-09-05 DIAGNOSIS — Z1389 Encounter for screening for other disorder: Secondary | ICD-10-CM | POA: Diagnosis not present

## 2020-09-05 DIAGNOSIS — Z0000 Encounter for general adult medical examination without abnormal findings: Secondary | ICD-10-CM | POA: Diagnosis not present

## 2020-09-06 ENCOUNTER — Other Ambulatory Visit: Payer: Self-pay | Admitting: Allergy

## 2020-09-06 DIAGNOSIS — S52501D Unspecified fracture of the lower end of right radius, subsequent encounter for closed fracture with routine healing: Secondary | ICD-10-CM | POA: Diagnosis not present

## 2020-09-07 DIAGNOSIS — H0100A Unspecified blepharitis right eye, upper and lower eyelids: Secondary | ICD-10-CM | POA: Diagnosis not present

## 2020-09-07 DIAGNOSIS — H04123 Dry eye syndrome of bilateral lacrimal glands: Secondary | ICD-10-CM | POA: Diagnosis not present

## 2020-09-07 DIAGNOSIS — H00024 Hordeolum internum left upper eyelid: Secondary | ICD-10-CM | POA: Diagnosis not present

## 2020-09-07 DIAGNOSIS — H0014 Chalazion left upper eyelid: Secondary | ICD-10-CM | POA: Diagnosis not present

## 2020-09-08 DIAGNOSIS — N390 Urinary tract infection, site not specified: Secondary | ICD-10-CM | POA: Diagnosis not present

## 2020-09-08 DIAGNOSIS — J4530 Mild persistent asthma, uncomplicated: Secondary | ICD-10-CM | POA: Diagnosis not present

## 2020-09-08 DIAGNOSIS — E7800 Pure hypercholesterolemia, unspecified: Secondary | ICD-10-CM | POA: Diagnosis not present

## 2020-09-08 DIAGNOSIS — K219 Gastro-esophageal reflux disease without esophagitis: Secondary | ICD-10-CM | POA: Diagnosis not present

## 2020-09-08 DIAGNOSIS — R079 Chest pain, unspecified: Secondary | ICD-10-CM | POA: Diagnosis not present

## 2020-09-08 DIAGNOSIS — F439 Reaction to severe stress, unspecified: Secondary | ICD-10-CM | POA: Diagnosis not present

## 2020-09-08 DIAGNOSIS — J309 Allergic rhinitis, unspecified: Secondary | ICD-10-CM | POA: Diagnosis not present

## 2020-09-12 DIAGNOSIS — F411 Generalized anxiety disorder: Secondary | ICD-10-CM | POA: Diagnosis not present

## 2020-09-12 DIAGNOSIS — G479 Sleep disorder, unspecified: Secondary | ICD-10-CM | POA: Diagnosis not present

## 2020-09-12 DIAGNOSIS — E7800 Pure hypercholesterolemia, unspecified: Secondary | ICD-10-CM | POA: Diagnosis not present

## 2020-09-12 DIAGNOSIS — Z01419 Encounter for gynecological examination (general) (routine) without abnormal findings: Secondary | ICD-10-CM | POA: Diagnosis not present

## 2020-09-12 DIAGNOSIS — Z0000 Encounter for general adult medical examination without abnormal findings: Secondary | ICD-10-CM | POA: Diagnosis not present

## 2020-09-12 DIAGNOSIS — M810 Age-related osteoporosis without current pathological fracture: Secondary | ICD-10-CM | POA: Diagnosis not present

## 2020-09-12 DIAGNOSIS — M1990 Unspecified osteoarthritis, unspecified site: Secondary | ICD-10-CM | POA: Diagnosis not present

## 2020-09-12 DIAGNOSIS — Z79899 Other long term (current) drug therapy: Secondary | ICD-10-CM | POA: Diagnosis not present

## 2020-09-12 DIAGNOSIS — K219 Gastro-esophageal reflux disease without esophagitis: Secondary | ICD-10-CM | POA: Diagnosis not present

## 2020-09-15 DIAGNOSIS — E213 Hyperparathyroidism, unspecified: Secondary | ICD-10-CM | POA: Diagnosis not present

## 2020-09-15 DIAGNOSIS — M810 Age-related osteoporosis without current pathological fracture: Secondary | ICD-10-CM | POA: Diagnosis not present

## 2020-09-26 DIAGNOSIS — N3941 Urge incontinence: Secondary | ICD-10-CM | POA: Diagnosis not present

## 2020-09-26 DIAGNOSIS — R350 Frequency of micturition: Secondary | ICD-10-CM | POA: Diagnosis not present

## 2020-09-26 DIAGNOSIS — N3010 Interstitial cystitis (chronic) without hematuria: Secondary | ICD-10-CM | POA: Diagnosis not present

## 2020-09-28 DIAGNOSIS — H0014 Chalazion left upper eyelid: Secondary | ICD-10-CM | POA: Diagnosis not present

## 2020-10-16 DIAGNOSIS — E538 Deficiency of other specified B group vitamins: Secondary | ICD-10-CM | POA: Diagnosis not present

## 2020-10-16 DIAGNOSIS — H903 Sensorineural hearing loss, bilateral: Secondary | ICD-10-CM | POA: Diagnosis not present

## 2020-10-19 DIAGNOSIS — K219 Gastro-esophageal reflux disease without esophagitis: Secondary | ICD-10-CM | POA: Diagnosis not present

## 2020-10-19 DIAGNOSIS — F411 Generalized anxiety disorder: Secondary | ICD-10-CM | POA: Diagnosis not present

## 2020-10-20 DIAGNOSIS — H00024 Hordeolum internum left upper eyelid: Secondary | ICD-10-CM | POA: Diagnosis not present

## 2020-10-20 DIAGNOSIS — H0014 Chalazion left upper eyelid: Secondary | ICD-10-CM | POA: Diagnosis not present

## 2020-10-27 ENCOUNTER — Ambulatory Visit: Payer: Medicare PPO | Admitting: Allergy

## 2020-10-27 ENCOUNTER — Other Ambulatory Visit: Payer: Self-pay

## 2020-10-27 ENCOUNTER — Encounter: Payer: Self-pay | Admitting: Allergy

## 2020-10-27 VITALS — BP 98/72 | HR 70 | Temp 98.3°F | Resp 12 | Ht 62.5 in | Wt 125.6 lb

## 2020-10-27 DIAGNOSIS — J4540 Moderate persistent asthma, uncomplicated: Secondary | ICD-10-CM | POA: Diagnosis not present

## 2020-10-27 DIAGNOSIS — K219 Gastro-esophageal reflux disease without esophagitis: Secondary | ICD-10-CM | POA: Diagnosis not present

## 2020-10-27 DIAGNOSIS — H1013 Acute atopic conjunctivitis, bilateral: Secondary | ICD-10-CM

## 2020-10-27 DIAGNOSIS — J301 Allergic rhinitis due to pollen: Secondary | ICD-10-CM | POA: Diagnosis not present

## 2020-10-27 MED ORDER — OLOPATADINE HCL 0.2 % OP SOLN
1.0000 [drp] | Freq: Every day | OPHTHALMIC | 5 refills | Status: DC | PRN
Start: 2020-10-27 — End: 2023-01-20

## 2020-10-27 NOTE — Progress Notes (Signed)
Follow-up Note  RE: Tiffany Reyes MRN: [id number] DOB: 1950/10/27 Date of Office Visit: 10/27/2020   History of present illness: Tiffany Reyes is a 70 y.o. female presenting today for follow-up of asthmatic bronchitis, allergic rhinitis with conjunctivitis and reflux.  She was last seen in the office on 06/29/2020 by myself.  She has been using Flovent 44 mcg 1 puff only once a day at this time.  She has not had any increase in symptoms requiring to go back up to 2 times a day use.  She has access to her rescue inhaler which she uses less than twice a week on average.  She has not required any ED or urgent care visits or any systemic steroid needs.  She states she has not required use of any antihistamines at this time.  She does have access to Pataday if she needed for itchy or watery eye symptoms but she does feel like she needs a refill.  She states she was with a friend's dog for a while dog sitting while the friend was recovering from a procedure.  She states the only thing she had was itchy eyes with the dog exposure.   She states during Dec and Jan she would have some discomfort with her breathing and where the chest felt cold and constricted.  She states she increased her temperature in the home and this resolved her symptoms.  Her wrist has recovered and no longer wearing the brace.  Her husband did pass away in January.      She is going to [place] in April 7th!   Review of systems: Review of Systems  Constitutional: Negative.   HENT: Negative.   Eyes: Negative.   Respiratory: Negative.   Cardiovascular: Negative.   Gastrointestinal: Negative.   Musculoskeletal: Negative.   Skin: Negative.   Neurological: Negative.     All other systems negative unless noted above in HPI  Past medical/social/surgical/family history have been reviewed and are unchanged unless specifically indicated below.  No changes  Medication List: Current Outpatient Medications   Medication Sig Dispense Refill  . albuterol (PROAIR HFA) 108 (90 Base) MCG/ACT inhaler Inhale 1-2 puffs into the lungs every 4 (four) hours as needed. 18 g 1  . atorvastatin (LIPITOR) 20 MG tablet 20 mg.    . calcium carbonate (TUMS EX) 750 MG chewable tablet Chew 1 tablet by mouth as needed for heartburn.    . cephALEXin (KEFLEX) 250 MG capsule Take 1 capsule by mouth daily.    . Cholecalciferol 25 MCG (1000 UT) tablet Take 2,000 Units by mouth daily.     Marland Kitchen escitalopram (LEXAPRO) 5 MG tablet Take 5 mg by mouth daily.    . fluticasone (FLOVENT HFA) 110 MCG/ACT inhaler Inhale 2 puffs into the lungs 2 (two) times daily. (Patient taking differently: Inhale 1 puff into the lungs daily at 6 (six) AM.) 1 Inhaler 5  . metroNIDAZOLE (METROCREAM) 0.75 % cream Apply topically every morning.    . montelukast (SINGULAIR) 10 MG tablet TAKE ONE TABLET AT BEDTIME. 30 tablet 1  . NON FORMULARY Take 2 capsules by mouth in the morning and at bedtime. HYDROEYE    . omeprazole (PRILOSEC) 40 MG capsule Take one capsule three times a week if symptoms persist then can go back to daily 90 capsule 3  . trimethoprim (TRIMPEX) 100 MG tablet Take 1 tablet by mouth daily.    . Olopatadine HCl (PATADAY) 0.2 % SOLN Place 1 drop into  both eyes daily as needed (Itchy/watery eyes). 2.5 mL 5   No current facility-administered medications for this visit.     Known medication allergies: Allergies  Allergen Reactions  . Ambien [Zolpidem]   . Alendronate Other (See Comments)  . Codeine Nausea Only  . Eszopiclone Other (See Comments)  . Fluconazole Hives    Veins stood out  . Moxifloxacin Hcl Other (See Comments)    Headache, bodyache  . Sulfa Antibiotics Other (See Comments)    Unknown reaction  . Tetanus Toxoids Swelling    August 1976 last tetanus expierenced intense fever. Swelling in arm.and Neck   . Trazodone Other (See Comments)    Hangover  . Excedrin Pm [Diphenhydramine-Apap (Sleep)]   . Penicillins Rash     Has patient had a PCN reaction causing immediate rash, facial/tongue/throat swelling, SOB or lightheadedness with hypotension: Yes Has patient had a PCN reaction causing severe rash involving mucus membranes or skin necrosis: No Has patient had a PCN reaction that required hospitalization No Has patient had a PCN reaction occurring within the last 10 years: No If all of the above answers are "NO", then may proceed with Cephalosporin use.      Physical examination: Blood pressure 98/72, pulse 70, temperature 98.3 F (36.8 C), resp. rate 12, height 5' 2.5" (1.588 m), weight 125 lb 9.6 oz (57 kg), SpO2 96 %.  General: Alert, interactive, in no acute distress. HEENT: PERRLA, TMs pearly gray, turbinates non-edematous without discharge, post-pharynx non erythematous. Neck: Supple without lymphadenopathy. Lungs: Clear to auscultation without wheezing, rhonchi or rales. {no increased work of breathing. CV: Normal S1, S2 without murmurs. Abdomen: Nondistended, nontender. Skin: Warm and dry, without lesions or rashes. Extremities:  No clubbing, cyanosis or edema. Neuro:   Grossly intact.  Diagnositics/Labs: None today  Assessment and plan:   Asthmatic bronchitis   - under good control!  - have access to albuterol inhaler 2 puffs every 4-6 hours as needed for cough/wheeze/shortness of breath/chest tightness.  May use 15-20 minutes prior to activity.   Monitor frequency of use.    - continue montelukast 10 mg daily-take at bedtime  - stop Flovent.   If you note any respiratory symptoms off Flovent then resume Flovent 1 puff once a day.    - for any respiratory illness: use Flovent 2 puffs twice a day until symptoms improve then can go back to as needed use  Control goals:   Full participation in all desired activities (may need albuterol before activity)  Albuterol use two time or less a week on average (not counting use with activity)  Cough interfering with sleep two time or less a  month  Oral steroids no more than once a year  No hospitalizations  Allergies  - continue avoidance measures for grass pollens, tree pollens, mold, dust mites, cat, dog, cockroach  - for allergy symptom relief recommend use of long-acting antihistamine like Allegra 180 mg, Zyrtec 10 mg or Xyzal 5 mg as needed  - for watery eyes Pataday 1 drop each eye daily as needed  Reflux  - continue daily omeprazole for control of reflux  Follow-up 4-6 months or sooner if needed I appreciate the opportunity to take part in Rona's care. Please do not hesitate to contact me with questions.  Sincerely,   Margo Aye, MD Allergy/Immunology [facility]

## 2020-10-27 NOTE — Patient Instructions (Signed)
Asthmatic bronchitis   - under good control!  - have access to albuterol inhaler 2 puffs every 4-6 hours as needed for cough/wheeze/shortness of breath/chest tightness.  May use 15-20 minutes prior to activity.   Monitor frequency of use.    - continue montelukast 10 mg daily-take at bedtime  - stop Flovent.   If you note any respiratory symptoms off Flovent then resume Flovent 1 puff once a day.    - for any respiratory illness: use Flovent 2 puffs twice a day until symptoms improve then can go back to as needed use  Control goals:   Full participation in all desired activities (may need albuterol before activity)  Albuterol use two time or less a week on average (not counting use with activity)  Cough interfering with sleep two time or less a month  Oral steroids no more than once a year  No hospitalizations  Allergies  - continue avoidance measures for grass pollens, tree pollens, mold, dust mites, cat, dog, cockroach  - for allergy symptom relief recommend use of long-acting antihistamine like Allegra 180 mg, Zyrtec 10 mg or Xyzal 5 mg as needed  - for watery eyes Pataday 1 drop each eye daily as needed  Reflux  - continue daily omeprazole for control of reflux  Follow-up 4-6 months or sooner if needed

## 2020-11-13 ENCOUNTER — Other Ambulatory Visit: Payer: Self-pay | Admitting: Allergy

## 2020-11-13 DIAGNOSIS — Z1152 Encounter for screening for COVID-19: Secondary | ICD-10-CM | POA: Diagnosis not present

## 2020-11-13 DIAGNOSIS — Z20822 Contact with and (suspected) exposure to covid-19: Secondary | ICD-10-CM | POA: Diagnosis not present

## 2020-11-15 DIAGNOSIS — R3989 Other symptoms and signs involving the genitourinary system: Secondary | ICD-10-CM | POA: Diagnosis not present

## 2020-11-23 DIAGNOSIS — H0100A Unspecified blepharitis right eye, upper and lower eyelids: Secondary | ICD-10-CM | POA: Diagnosis not present

## 2020-11-23 DIAGNOSIS — H04123 Dry eye syndrome of bilateral lacrimal glands: Secondary | ICD-10-CM | POA: Diagnosis not present

## 2020-11-23 DIAGNOSIS — H00024 Hordeolum internum left upper eyelid: Secondary | ICD-10-CM | POA: Diagnosis not present

## 2020-11-23 DIAGNOSIS — H0014 Chalazion left upper eyelid: Secondary | ICD-10-CM | POA: Diagnosis not present

## 2020-12-18 DIAGNOSIS — E538 Deficiency of other specified B group vitamins: Secondary | ICD-10-CM | POA: Diagnosis not present

## 2020-12-18 DIAGNOSIS — E7800 Pure hypercholesterolemia, unspecified: Secondary | ICD-10-CM | POA: Diagnosis not present

## 2020-12-18 DIAGNOSIS — M810 Age-related osteoporosis without current pathological fracture: Secondary | ICD-10-CM | POA: Diagnosis not present

## 2020-12-20 ENCOUNTER — Other Ambulatory Visit: Payer: Self-pay | Admitting: Allergy

## 2020-12-22 DIAGNOSIS — E7800 Pure hypercholesterolemia, unspecified: Secondary | ICD-10-CM | POA: Diagnosis not present

## 2020-12-22 DIAGNOSIS — N6323 Unspecified lump in the left breast, lower outer quadrant: Secondary | ICD-10-CM | POA: Diagnosis not present

## 2020-12-22 DIAGNOSIS — E559 Vitamin D deficiency, unspecified: Secondary | ICD-10-CM | POA: Diagnosis not present

## 2020-12-22 DIAGNOSIS — E538 Deficiency of other specified B group vitamins: Secondary | ICD-10-CM | POA: Diagnosis not present

## 2021-01-15 DIAGNOSIS — Z20822 Contact with and (suspected) exposure to covid-19: Secondary | ICD-10-CM | POA: Diagnosis not present

## 2021-01-15 DIAGNOSIS — R519 Headache, unspecified: Secondary | ICD-10-CM | POA: Diagnosis not present

## 2021-01-22 DIAGNOSIS — Z20822 Contact with and (suspected) exposure to covid-19: Secondary | ICD-10-CM | POA: Diagnosis not present

## 2021-01-22 DIAGNOSIS — R059 Cough, unspecified: Secondary | ICD-10-CM | POA: Diagnosis not present

## 2021-01-29 DIAGNOSIS — J40 Bronchitis, not specified as acute or chronic: Secondary | ICD-10-CM | POA: Diagnosis not present

## 2021-01-30 DIAGNOSIS — M810 Age-related osteoporosis without current pathological fracture: Secondary | ICD-10-CM | POA: Diagnosis not present

## 2021-02-05 ENCOUNTER — Other Ambulatory Visit: Payer: Self-pay | Admitting: Allergy

## 2021-02-05 ENCOUNTER — Ambulatory Visit
Admission: RE | Admit: 2021-02-05 | Discharge: 2021-02-05 | Disposition: A | Discharge status: Home / Self Care | Payer: Medicare PPO | Source: Ambulatory Visit | Attending: Family Medicine | Admitting: Family Medicine

## 2021-02-05 ENCOUNTER — Other Ambulatory Visit: Payer: Self-pay

## 2021-02-05 ENCOUNTER — Other Ambulatory Visit: Payer: Self-pay | Admitting: Family Medicine

## 2021-02-05 DIAGNOSIS — R059 Cough, unspecified: Secondary | ICD-10-CM | POA: Diagnosis not present

## 2021-02-05 DIAGNOSIS — R0602 Shortness of breath: Secondary | ICD-10-CM | POA: Diagnosis not present

## 2021-03-06 DIAGNOSIS — H18591 Other hereditary corneal dystrophies, right eye: Secondary | ICD-10-CM | POA: Diagnosis not present

## 2021-03-06 DIAGNOSIS — H04123 Dry eye syndrome of bilateral lacrimal glands: Secondary | ICD-10-CM | POA: Diagnosis not present

## 2021-03-06 DIAGNOSIS — G43819 Other migraine, intractable, without status migrainosus: Secondary | ICD-10-CM | POA: Diagnosis not present

## 2021-03-06 DIAGNOSIS — H0100A Unspecified blepharitis right eye, upper and lower eyelids: Secondary | ICD-10-CM | POA: Diagnosis not present

## 2021-03-09 DIAGNOSIS — R519 Headache, unspecified: Secondary | ICD-10-CM | POA: Diagnosis not present

## 2021-03-09 DIAGNOSIS — N6323 Unspecified lump in the left breast, lower outer quadrant: Secondary | ICD-10-CM | POA: Diagnosis not present

## 2021-03-09 DIAGNOSIS — E538 Deficiency of other specified B group vitamins: Secondary | ICD-10-CM | POA: Diagnosis not present

## 2021-03-09 DIAGNOSIS — R7309 Other abnormal glucose: Secondary | ICD-10-CM | POA: Diagnosis not present

## 2021-03-09 DIAGNOSIS — E559 Vitamin D deficiency, unspecified: Secondary | ICD-10-CM | POA: Diagnosis not present

## 2021-03-09 DIAGNOSIS — Z6822 Body mass index (BMI) 22.0-22.9, adult: Secondary | ICD-10-CM | POA: Diagnosis not present

## 2021-03-09 DIAGNOSIS — E7800 Pure hypercholesterolemia, unspecified: Secondary | ICD-10-CM | POA: Diagnosis not present

## 2021-03-14 DIAGNOSIS — S1096XA Insect bite of unspecified part of neck, initial encounter: Secondary | ICD-10-CM | POA: Diagnosis not present

## 2021-03-14 DIAGNOSIS — W57XXXA Bitten or stung by nonvenomous insect and other nonvenomous arthropods, initial encounter: Secondary | ICD-10-CM | POA: Diagnosis not present

## 2021-03-14 DIAGNOSIS — L299 Pruritus, unspecified: Secondary | ICD-10-CM | POA: Diagnosis not present

## 2021-03-29 ENCOUNTER — Encounter: Payer: Self-pay | Admitting: Allergy

## 2021-03-29 ENCOUNTER — Other Ambulatory Visit: Payer: Self-pay

## 2021-03-29 ENCOUNTER — Ambulatory Visit: Payer: Medicare PPO | Admitting: Allergy

## 2021-03-29 VITALS — BP 110/72 | HR 76 | Temp 98.0°F | Ht 62.25 in | Wt 124.1 lb

## 2021-03-29 DIAGNOSIS — J301 Allergic rhinitis due to pollen: Secondary | ICD-10-CM

## 2021-03-29 DIAGNOSIS — J4540 Moderate persistent asthma, uncomplicated: Secondary | ICD-10-CM

## 2021-03-29 DIAGNOSIS — K219 Gastro-esophageal reflux disease without esophagitis: Secondary | ICD-10-CM

## 2021-03-29 DIAGNOSIS — H1013 Acute atopic conjunctivitis, bilateral: Secondary | ICD-10-CM

## 2021-03-29 NOTE — Patient Instructions (Addendum)
Asthmatic bronchitis   - doing better after having presumed viral illness in June  - have access to albuterol inhaler 2 puffs every 4-6 hours as needed for cough/wheeze/shortness of breath/chest tightness.  May use 15-20 minutes prior to activity.   Monitor frequency of use.    - continue montelukast 10 mg daily-take at bedtime  - continue Flovent 2 puffs once a day at this time.   Will plan to continue this regimen the fall and winter viral seasons.     - for any respiratory illness: use Flovent 2 puffs twice a day until symptoms improve then can go back to once a day use  Control goals:  Full participation in all desired activities (may need albuterol before activity) Albuterol use two time or less a week on average (not counting use with activity) Cough interfering with sleep two time or less a month Oral steroids no more than once a year No hospitalizations  Allergies  - continue avoidance measures for grass pollens, tree pollens, mold, dust mites, cat, dog, cockroach  - for allergy symptom relief recommend use of long-acting antihistamine like Allegra 180 mg, Zyrtec 10 mg or Xyzal 5 mg as needed  - for watery eyes Pataday 1 drop each eye daily as needed  Reflux  - continue daily omeprazole for control of reflux  Follow-up 4-6 months or sooner if needed

## 2021-03-29 NOTE — Progress Notes (Signed)
Follow-up Note  RE: Tiffany Reyes MRN: [id number] DOB: 1951/06/09 Date of Office Visit: 03/29/2021   History of present illness: Tiffany Reyes is a 70 y.o. female presenting today for follow-up of asthmatic bronchitis, allergic rhinitis with conjunctivitis, reflux.  She was last seen in the office on 10/27/2020 by myself. She states she had a bad month of June.   She got her covid booster the last Monday in May.   The last Friday in May she went to visit her friend who wasn't feeling that well.  This friend felt better enough some days later and visited her home for an outdoor party.  Alynah states the friend states felt ill again while at the party and felt like she needed to go be evaluated and Shenna went with her to [facility].  Friend was diagnosed with Covid and was treated.   Later Jeanie states she started to not feel well and was noting her temperature was starting to increase and she was starting to feel like she was having chest congestion and tightness.  She called her PCP whoe prescribed an antibiotic which didn't help that much and she then got prednisone which did help.   At this time of this illness she was off Flovent as she had been doing well and was controlled.  Her inhaler regimen was increased to medium dose flovent and she has since tapered back down to low dose Flovent 2 puffs in morning.  She continues to take singulair daily.  She is feeling much better at this time and hopeful she can get back off flovent in future.    Review of systems in the past 4 weeks: Review of Systems  Constitutional: Negative.   HENT: Negative.    Eyes: Negative.   Respiratory: Negative.    Cardiovascular: Negative.   Gastrointestinal: Negative.   Musculoskeletal: Negative.   Skin: Negative.   Neurological: Negative.    All other systems negative unless noted above in HPI  Past medical/social/surgical/family history have been reviewed and are unchanged unless specifically indicated  below.  No changes  Medication List: Current Outpatient Medications  Medication Sig Dispense Refill   atorvastatin (LIPITOR) 20 MG tablet 20 mg.     calcium carbonate (TUMS EX) 750 MG chewable tablet Chew 1 tablet by mouth as needed for heartburn.     Cholecalciferol 25 MCG (1000 UT) tablet Take 2,000 Units by mouth daily.      escitalopram (LEXAPRO) 5 MG tablet Take 5 mg by mouth daily.     FLOVENT HFA 44 MCG/ACT inhaler INHALE 2 PUFFS INTO THE LUNGS TWICE DAILY 10.6 g 3   fluticasone (FLOVENT HFA) 110 MCG/ACT inhaler Inhale 2 puffs into the lungs 2 (two) times daily. (Patient taking differently: Inhale 1 puff into the lungs daily at 6 (six) AM.) 1 Inhaler 5   metroNIDAZOLE (METROCREAM) 0.75 % cream Apply topically every morning.     montelukast (SINGULAIR) 10 MG tablet TAKE ONE TABLET AT BEDTIME. 30 tablet 5   NON FORMULARY Take 2 capsules by mouth in the morning and at bedtime. HYDROEYE     Olopatadine HCl (PATADAY) 0.2 % SOLN Place 1 drop into both eyes daily as needed (Itchy/watery eyes). 2.5 mL 5   omeprazole (PRILOSEC) 40 MG capsule Take one capsule three times a week if symptoms persist then can go back to daily 90 capsule 3   PROAIR HFA 108 (90 Base) MCG/ACT inhaler USE 1 OR 2 PUFFS EVERY 4 HOURS AS NEEDED.  8.5 g 1   trimethoprim (TRIMPEX) 100 MG tablet Take 1 tablet by mouth daily.     No current facility-administered medications for this visit.     Known medication allergies: Allergies  Allergen Reactions   Ambien [Zolpidem]    Alendronate Other (See Comments)   Codeine Nausea Only   Eszopiclone Other (See Comments)   Fluconazole Hives    Veins stood out   Moxifloxacin Hcl Other (See Comments)    Headache, bodyache   Sulfa Antibiotics Other (See Comments)    Unknown reaction   Tetanus Toxoids Swelling    August 1976 last tetanus expierenced intense fever. Swelling in arm.and Neck    Trazodone Other (See Comments)    Hangover   Excedrin Pm [Diphenhydramine-Apap  (Sleep)]    Penicillins Rash    Has patient had a PCN reaction causing immediate rash, facial/tongue/throat swelling, SOB or lightheadedness with hypotension: Yes Has patient had a PCN reaction causing severe rash involving mucus membranes or skin necrosis: No Has patient had a PCN reaction that required hospitalization No Has patient had a PCN reaction occurring within the last 10 years: No If all of the above answers are "NO", then may proceed with Cephalosporin use.      Physical examination: Blood pressure 110/72, pulse 76, temperature 98 F (36.7 C), temperature source Temporal, height 5' 2.25" (1.581 m), weight 124 lb 2 oz (56.3 kg), SpO2 97 %.  General: Alert, interactive, in no acute distress. HEENT: PERRLA, TMs pearly gray, turbinates non-edematous without discharge, post-pharynx non erythematous. Neck: Supple without lymphadenopathy. Lungs: Clear to auscultation without wheezing, rhonchi or rales. {no increased work of breathing. CV: Normal S1, S2 without murmurs. Abdomen: Nondistended, nontender. Skin: Warm and dry, without lesions or rashes. Extremities:  No clubbing, cyanosis or edema. Neuro:   Grossly intact.  Diagnositics/Labs:  Spirometry: FEV1: 1.71 L 84%, FVC: 2.21 L 82%, ratio consistent with nonobstructive pattern   Assessment and plan:   Asthmatic bronchitis   - doing better after having presumed viral illness in June  - have access to albuterol inhaler 2 puffs every 4-6 hours as needed for cough/wheeze/shortness of breath/chest tightness.  May use 15-20 minutes prior to activity.   Monitor frequency of use.    - continue montelukast 10 mg daily-take at bedtime  - continue Flovent 2 puffs once a day at this time.   Will plan to continue this regimen the fall and winter viral seasons.     - for any respiratory illness: use Flovent 2 puffs twice a day until symptoms improve then can go back to once a day use  Control goals:  Full participation in all  desired activities (may need albuterol before activity) Albuterol use two time or less a week on average (not counting use with activity) Cough interfering with sleep two time or less a month Oral steroids no more than once a year No hospitalizations  Allergies  - continue avoidance measures for grass pollens, tree pollens, mold, dust mites, cat, dog, cockroach  - for allergy symptom relief recommend use of long-acting antihistamine like Allegra 180 mg, Zyrtec 10 mg or Xyzal 5 mg as needed  - for watery eyes Pataday 1 drop each eye daily as needed  Reflux  - continue daily omeprazole for control of reflux  Follow-up 4-6 months or sooner if needed  I appreciate the opportunity to take part in Oleva's care. Please do not hesitate to contact me with questions.  Sincerely,   Margo Aye, MD  Allergy/Immunology [facility]

## 2021-04-04 DIAGNOSIS — L814 Other melanin hyperpigmentation: Secondary | ICD-10-CM | POA: Diagnosis not present

## 2021-04-04 DIAGNOSIS — I788 Other diseases of capillaries: Secondary | ICD-10-CM | POA: Diagnosis not present

## 2021-05-04 DIAGNOSIS — Z1231 Encounter for screening mammogram for malignant neoplasm of breast: Secondary | ICD-10-CM | POA: Diagnosis not present

## 2021-05-31 ENCOUNTER — Other Ambulatory Visit: Payer: Self-pay

## 2021-05-31 ENCOUNTER — Ambulatory Visit: Payer: Medicare PPO | Admitting: Allergy

## 2021-05-31 ENCOUNTER — Encounter: Payer: Self-pay | Admitting: Allergy

## 2021-05-31 VITALS — BP 106/70 | HR 90 | Temp 98.0°F | Resp 18 | Ht 62.25 in | Wt 124.0 lb

## 2021-05-31 DIAGNOSIS — K219 Gastro-esophageal reflux disease without esophagitis: Secondary | ICD-10-CM

## 2021-05-31 DIAGNOSIS — H1013 Acute atopic conjunctivitis, bilateral: Secondary | ICD-10-CM

## 2021-05-31 DIAGNOSIS — J301 Allergic rhinitis due to pollen: Secondary | ICD-10-CM | POA: Diagnosis not present

## 2021-05-31 DIAGNOSIS — J4540 Moderate persistent asthma, uncomplicated: Secondary | ICD-10-CM | POA: Diagnosis not present

## 2021-05-31 NOTE — Progress Notes (Signed)
Follow-up Note  RE: Tiffany Reyes MRN: [id number] DOB: 1951-05-01 Date of Office Visit: 05/31/2021    History of present illness: Tiffany Reyes is a 70 y.o. female presenting today for follow-up of asthmatic bronchitis, allergitc rhinitis with conjunctivitis and reflux.  She was last seen in the office on 03/19/2021 by myself.  She presents today as she has been having chest tightness sensation for about 2 weeks now and has been using her flovent or so she thought.  However upon looking at the inhaler the counter is showing 0.  Thus she is not sure how long it has been empty.   She has used her albuterol on several occasions due to this.  She also states she is stressed as her really good friend had a stroke and is still currently in the hospital.   Review of systems: Review of Systems  Constitutional: Negative.   HENT: Negative.    Eyes: Negative.   Respiratory:         See HPI  Cardiovascular: Negative.   Gastrointestinal: Negative.   Musculoskeletal: Negative.   Skin: Negative.   Neurological: Negative.    All other systems negative unless noted above in HPI  Past medical/social/surgical/family history have been reviewed and are unchanged unless specifically indicated below.  No changes  Medication List: Current Outpatient Medications  Medication Sig Dispense Refill   atorvastatin (LIPITOR) 20 MG tablet 20 mg.     calcium carbonate (TUMS EX) 750 MG chewable tablet Chew 1 tablet by mouth as needed for heartburn.     Cholecalciferol 25 MCG (1000 UT) tablet Take 2,000 Units by mouth daily.      escitalopram (LEXAPRO) 5 MG tablet Take 5 mg by mouth daily.     FLOVENT HFA 44 MCG/ACT inhaler INHALE 2 PUFFS INTO THE LUNGS TWICE DAILY 10.6 g 3   fluticasone (FLOVENT HFA) 110 MCG/ACT inhaler Inhale 2 puffs into the lungs 2 (two) times daily. (Patient taking differently: Inhale 1 puff into the lungs daily at 6 (six) AM.) 1 Inhaler 5   metroNIDAZOLE (METROCREAM) 0.75 % cream Apply  topically every morning.     montelukast (SINGULAIR) 10 MG tablet TAKE ONE TABLET AT BEDTIME. 30 tablet 5   NON FORMULARY Take 2 capsules by mouth in the morning and at bedtime. HYDROEYE     Olopatadine HCl (PATADAY) 0.2 % SOLN Place 1 drop into both eyes daily as needed (Itchy/watery eyes). 2.5 mL 5   omeprazole (PRILOSEC) 40 MG capsule Take one capsule three times a week if symptoms persist then can go back to daily 90 capsule 3   PROAIR HFA 108 (90 Base) MCG/ACT inhaler USE 1 OR 2 PUFFS EVERY 4 HOURS AS NEEDED. 8.5 g 1   trimethoprim (TRIMPEX) 100 MG tablet Take 1 tablet by mouth daily.     No current facility-administered medications for this visit.     Known medication allergies: Allergies  Allergen Reactions   Ambien [Zolpidem]    Alendronate Other (See Comments)   Codeine Nausea Only   Eszopiclone Other (See Comments)   Fluconazole Hives    Veins stood out   Moxifloxacin Hcl Other (See Comments)    Headache, bodyache   Sulfa Antibiotics Other (See Comments)    Unknown reaction   Tetanus Toxoids Swelling    August 1976 last tetanus expierenced intense fever. Swelling in arm.and Neck    Trazodone Other (See Comments)    Hangover   Excedrin Pm [Diphenhydramine-Apap (Sleep)]  Penicillins Rash    Has patient had a PCN reaction causing immediate rash, facial/tongue/throat swelling, SOB or lightheadedness with hypotension: Yes Has patient had a PCN reaction causing severe rash involving mucus membranes or skin necrosis: No Has patient had a PCN reaction that required hospitalization No Has patient had a PCN reaction occurring within the last 10 years: No If all of the above answers are "NO", then may proceed with Cephalosporin use.      Physical examination: Blood pressure 106/70, pulse 90, temperature 98 F (36.7 C), temperature source Temporal, resp. rate 18, height 5' 2.25" (1.581 m), weight 124 lb (56.2 kg), SpO2 98 %.  General: Alert, interactive, in no acute  distress. HEENT: PERRLA, TMs pearly gray, turbinates non-edematous without discharge, post-pharynx non erythematous. Neck: Supple without lymphadenopathy. Lungs: Clear to auscultation without wheezing, rhonchi or rales. {no increased work of breathing. CV: Normal S1, S2 without murmurs. Abdomen: Nondistended, nontender. Skin: Warm and dry, without lesions or rashes. Extremities:  No clubbing, cyanosis or edema. Neuro:   Grossly intact.  Diagnositics/Labs:  Spirometry: FEV1: 1.77 L 87%, FVC: 2.33 L 86%, ratio consistent with nonobstructive pattern  Assessment and plan:   Asthmatic bronchitis   - lung function testing looks normal and stable from previous visit which is reassuring  - have access to albuterol inhaler 2 puffs every 4-6 hours as needed for cough/wheeze/shortness of breath/chest tightness.  May use 15-20 minutes prior to activity.   Monitor frequency of use.    - continue montelukast 10 mg daily-take at bedtime  - resume Flovent 2 puffs twice a day at this time.     - Believe current symptoms is due to not having inhaled steroids in as we will not know how long she has been out of the Flovent  Control goals:  Full participation in all desired activities (may need albuterol before activity) Albuterol use two time or less a week on average (not counting use with activity) Cough interfering with sleep two time or less a month Oral steroids no more than once a year No hospitalizations  Allergic rhinitis with conjunctivitis  - continue avoidance measures for grass pollens, tree pollens, mold, dust mites, cat, dog, cockroach  - for allergy symptom relief recommend use of long-acting antihistamine like Allegra 180 mg, Zyrtec 10 mg or Xyzal 5 mg as needed  - for watery eyes Pataday 1 drop each eye daily as needed  Reflux  - continue daily omeprazole for control of reflux  Let us know next week how your symptoms are doing after resuming Flovent Do you best to decrease  stressors Follow-up 4-6 months or sooner if needed  I appreciate the opportunity to take part in Tiffany Reyes's care. Please do not hesitate to contact me with questions.  Sincerely,   Margo Aye, MD Allergy/Immunology [facility]

## 2021-05-31 NOTE — Patient Instructions (Addendum)
Asthmatic bronchitis   - lung function testing looks normal and stable from previous visit which is reassuring  - have access to albuterol inhaler 2 puffs every 4-6 hours as needed for cough/wheeze/shortness of breath/chest tightness.  May use 15-20 minutes prior to activity.   Monitor frequency of use.    - continue montelukast 10 mg daily-take at bedtime  - resume Flovent 2 puffs twice a day at this time.     Control goals:  Full participation in all desired activities (may need albuterol before activity) Albuterol use two time or less a week on average (not counting use with activity) Cough interfering with sleep two time or less a month Oral steroids no more than once a year No hospitalizations  Allergies  - continue avoidance measures for grass pollens, tree pollens, mold, dust mites, cat, dog, cockroach  - for allergy symptom relief recommend use of long-acting antihistamine like Allegra 180 mg, Zyrtec 10 mg or Xyzal 5 mg as needed  - for watery eyes Pataday 1 drop each eye daily as needed  Reflux  - continue daily omeprazole for control of reflux  Let us know next week how your symptoms are doing after resuming Flovent Do you best to decrease stressors Follow-up 4-6 months or sooner if needed

## 2021-06-01 ENCOUNTER — Other Ambulatory Visit: Payer: Self-pay | Admitting: Allergy

## 2021-06-05 ENCOUNTER — Telehealth: Payer: Self-pay | Admitting: Allergy

## 2021-06-05 NOTE — Telephone Encounter (Signed)
Patient has been made aware to continue with Flovent and verbalized understanding.

## 2021-06-05 NOTE — Telephone Encounter (Signed)
Pt would like to let Dr. Delorse Lek know her chest pressure has been decreasing. Patient's energy does not last more than 5-6 hours but this morning she woke up with no chest pressure, only a little bit of nausea. Patient states other than that she is doing better.   Best contact number: [phone]

## 2021-06-06 NOTE — Telephone Encounter (Signed)
Patient would like to know if she needs to see her family physician or Dr. Delorse Lek. Patient is stating she is having some shortness of breath and some chest tightness when walking. She is not having any discomfort.   Best contact number: [phone] (home) or [phone] (cell).

## 2021-06-07 NOTE — Telephone Encounter (Signed)
Left a message for patient to call the office, will need to inform her of Dr. Randell Patient recommendation.

## 2021-06-08 NOTE — Telephone Encounter (Signed)
Informed patient of Dr.Padgett's recommendation. Patient verbalized understanding.

## 2021-06-12 DIAGNOSIS — F411 Generalized anxiety disorder: Secondary | ICD-10-CM | POA: Diagnosis not present

## 2021-06-12 DIAGNOSIS — Z23 Encounter for immunization: Secondary | ICD-10-CM | POA: Diagnosis not present

## 2021-07-26 DIAGNOSIS — E7800 Pure hypercholesterolemia, unspecified: Secondary | ICD-10-CM | POA: Diagnosis not present

## 2021-07-26 DIAGNOSIS — E559 Vitamin D deficiency, unspecified: Secondary | ICD-10-CM | POA: Diagnosis not present

## 2021-07-26 DIAGNOSIS — F411 Generalized anxiety disorder: Secondary | ICD-10-CM | POA: Diagnosis not present

## 2021-07-26 DIAGNOSIS — E538 Deficiency of other specified B group vitamins: Secondary | ICD-10-CM | POA: Diagnosis not present

## 2021-07-26 DIAGNOSIS — J4530 Mild persistent asthma, uncomplicated: Secondary | ICD-10-CM | POA: Diagnosis not present

## 2021-07-26 DIAGNOSIS — K219 Gastro-esophageal reflux disease without esophagitis: Secondary | ICD-10-CM | POA: Diagnosis not present

## 2021-08-03 DIAGNOSIS — M5030 Other cervical disc degeneration, unspecified cervical region: Secondary | ICD-10-CM | POA: Diagnosis not present

## 2021-08-09 DIAGNOSIS — Z6823 Body mass index (BMI) 23.0-23.9, adult: Secondary | ICD-10-CM | POA: Diagnosis not present

## 2021-08-09 DIAGNOSIS — Z03818 Encounter for observation for suspected exposure to other biological agents ruled out: Secondary | ICD-10-CM | POA: Diagnosis not present

## 2021-08-09 DIAGNOSIS — R059 Cough, unspecified: Secondary | ICD-10-CM | POA: Diagnosis not present

## 2021-08-09 DIAGNOSIS — J4530 Mild persistent asthma, uncomplicated: Secondary | ICD-10-CM | POA: Diagnosis not present

## 2021-08-09 DIAGNOSIS — B974 Respiratory syncytial virus as the cause of diseases classified elsewhere: Secondary | ICD-10-CM | POA: Diagnosis not present

## 2021-08-17 DIAGNOSIS — M25562 Pain in left knee: Secondary | ICD-10-CM | POA: Diagnosis not present

## 2021-08-17 DIAGNOSIS — M25552 Pain in left hip: Secondary | ICD-10-CM | POA: Diagnosis not present

## 2021-08-17 DIAGNOSIS — M545 Low back pain, unspecified: Secondary | ICD-10-CM | POA: Diagnosis not present

## 2021-08-30 ENCOUNTER — Ambulatory Visit (INDEPENDENT_AMBULATORY_CARE_PROVIDER_SITE_OTHER): Payer: Medicare PPO | Admitting: Allergy

## 2021-08-30 ENCOUNTER — Other Ambulatory Visit: Payer: Self-pay

## 2021-08-30 ENCOUNTER — Encounter: Payer: Self-pay | Admitting: Allergy

## 2021-08-30 VITALS — BP 116/66 | HR 73 | Temp 97.1°F | Resp 12 | Ht 62.0 in | Wt 126.2 lb

## 2021-08-30 DIAGNOSIS — J4540 Moderate persistent asthma, uncomplicated: Secondary | ICD-10-CM

## 2021-08-30 DIAGNOSIS — K219 Gastro-esophageal reflux disease without esophagitis: Secondary | ICD-10-CM | POA: Diagnosis not present

## 2021-08-30 DIAGNOSIS — H1013 Acute atopic conjunctivitis, bilateral: Secondary | ICD-10-CM | POA: Diagnosis not present

## 2021-08-30 DIAGNOSIS — J301 Allergic rhinitis due to pollen: Secondary | ICD-10-CM | POA: Diagnosis not present

## 2021-08-30 MED ORDER — OMEPRAZOLE 40 MG PO CPDR: DELAYED_RELEASE_CAPSULE | ORAL | 3 refills | Status: DC

## 2021-08-30 MED ORDER — ALBUTEROL SULFATE HFA 108 (90 BASE) MCG/ACT IN AERS: 2.0000 | INHALATION_SPRAY | Freq: Four times a day (QID) | RESPIRATORY_TRACT | 1 refills | Status: DC | PRN

## 2021-08-30 MED ORDER — FLUTICASONE PROPIONATE HFA 110 MCG/ACT IN AERO: 2.0000 | INHALATION_SPRAY | Freq: Two times a day (BID) | RESPIRATORY_TRACT | 5 refills | Status: DC

## 2021-08-30 NOTE — Progress Notes (Signed)
Follow-up Note  RE: QUIDA Reyes MRN: [id number] DOB: [birth year] Date of Office Visit: 08/30/2021   History of present illness: Tiffany Reyes is a 71 y.o. female presenting today for follow-up of asthmatic bronchitis.  She was last seen in the office on 05/31/21 by myself.    She states she was diagnosed with RSV in December around Christmas.  Symptoms included cough, congestion (minor).  She sates this is the first week where she is feeling better.  She states she can do now what she wants to do but she pays for it later.  She put up her christmas tree yesterday and had to take it up to the attic.  She did use her albuterol with this activity.  Today she is feeling chest tightness.   She is currently on low dose Flovent 2 puffs twice a day and singulair daily.   She has access to antihistamine and pataday for allergy symptom control and omeprazole for reflux control. She is going to [place] in March!      Review of systems: Review of Systems  Constitutional: Negative.   HENT: Negative.    Eyes: Negative.   Respiratory:         See HPI  Cardiovascular: Negative.   Gastrointestinal: Negative.   Musculoskeletal: Negative.   Skin: Negative.   Allergic/Immunologic: Negative.   Neurological: Negative.     All other systems negative unless noted above in HPI  Past medical/social/surgical/family history have been reviewed and are unchanged unless specifically indicated below.  No changes  Medication List: Current Outpatient Medications  Medication Sig Dispense Refill   acetaminophen (TYLENOL) 500 MG tablet Take 500 mg by mouth as needed.     atorvastatin (LIPITOR) 20 MG tablet 20 mg.     Cholecalciferol (VITAMIN D3) 50 MCG (2000 UT) capsule 1 capsule     escitalopram (LEXAPRO) 5 MG tablet Take 5 mg by mouth daily.     metroNIDAZOLE (METROCREAM) 0.75 % cream Apply topically every morning.     montelukast (SINGULAIR) 10 MG tablet TAKE ONE TABLET AT BEDTIME. 30 tablet 5    NON FORMULARY Take 2 capsules by mouth in the morning and at bedtime. HYDROEYE     Olopatadine HCl (PATADAY) 0.2 % SOLN Place 1 drop into both eyes daily as needed (Itchy/watery eyes). 2.5 mL 5   omeprazole (PRILOSEC) 40 MG capsule Take one capsule three times a week if symptoms persist then can go back to daily 90 capsule 3   trimethoprim (TRIMPEX) 100 MG tablet Take 1 tablet by mouth daily.     Zinc 50 MG TABS Take 50 mg by mouth daily.     calcium carbonate (TUMS EX) 750 MG chewable tablet Chew 1 tablet by mouth as needed for heartburn. (Patient not taking: Reported on 08/30/2021)     No current facility-administered medications for this visit.     Known medication allergies: Allergies  Allergen Reactions   Ambien [Zolpidem]    Alendronate Other (See Comments)   Codeine Nausea Only   Eszopiclone Other (See Comments)   Fluconazole Hives    Veins stood out   Moxifloxacin Hcl Other (See Comments)    Headache, bodyache   Sulfa Antibiotics Other (See Comments)    Unknown reaction   Tetanus Toxoids Swelling    August 1976 last tetanus expierenced intense fever. Swelling in arm.and Neck    Trazodone Other (See Comments)    Hangover   Excedrin Pm [Diphenhydramine-Apap (Sleep)]    Penicillins  Rash    Has patient had a PCN reaction causing immediate rash, facial/tongue/throat swelling, SOB or lightheadedness with hypotension: Yes Has patient had a PCN reaction causing severe rash involving mucus membranes or skin necrosis: No Has patient had a PCN reaction that required hospitalization No Has patient had a PCN reaction occurring within the last 10 years: No If all of the above answers are "NO", then may proceed with Cephalosporin use.      Physical examination: Blood pressure 116/66, pulse 73, temperature (!) 97.1 F (36.2 C), temperature source Temporal, resp. rate 12, height 5\' 2"  (1.575 m), weight 126 lb 3.2 oz (57.2 kg), SpO2 98 %.  General: Alert, interactive, in no acute  distress. HEENT: PERRLA, TMs pearly gray, turbinates non-edematous without discharge, post-pharynx non erythematous. Neck: Supple without lymphadenopathy. Lungs: Clear to auscultation without wheezing, rhonchi or rales. {no increased work of breathing. CV: Normal S1, S2 without murmurs. Abdomen: Nondistended, nontender. Skin: Warm and dry, without lesions or rashes. Extremities:  No clubbing, cyanosis or edema. Neuro:   Grossly intact.  Diagnositics/Labs:  Spirometry: FEV1: 1.55L 76%, FVC: 1.89L 72%, ratio consistent with nonobstructive pattern for age.  However is slightly reduced from previous study  Assessment and plan:   Asthmatic bronchitis   - lung function testing remains in normal limits for age but is slightly reduced from previous study  - have access to albuterol inhaler 2 puffs every 4-6 hours as needed for cough/wheeze/shortness of breath/chest tightness.  May use 15-20 minutes prior to activity.   Monitor frequency of use.    - continue montelukast 10 mg daily-take at bedtime  - increase to Flovent 2 puffs twice a day at this time.     - Asthma Action Plan (initiate during asthma flares or respiratory illness): take Flovent 3 puffs 3 times a day for 1-2 weeks or until symptoms have resolved then resume your daily maintenance dosing   Control goals:  Full participation in all desired activities (may need albuterol before activity) Albuterol use two time or less a week on average (not counting use with activity) Cough interfering with sleep two time or less a month Oral steroids no more than once a year No hospitalizations  Allergic rhinitis with conjunctivitis  - continue avoidance measures for grass pollens, tree pollens, mold, dust mites, cat, dog, cockroach  - for allergy symptom relief recommend use of long-acting antihistamine like Allegra 180 mg, Zyrtec 10 mg or Xyzal 5 mg as needed  - for watery eyes Pataday 1 drop each eye daily as needed  Reflux  -  continue daily omeprazole for control of reflux   Follow-up 4-6 months or sooner if needed  I appreciate the opportunity to take part in Tiffany Reyes's care. Please do not hesitate to contact me with questions.  Sincerely,   , MD Allergy/Immunology [facility]

## 2021-08-30 NOTE — Patient Instructions (Addendum)
Asthmatic bronchitis   - lung function testing remains in normal limits for age but is slightly reduced from previous study  - have access to albuterol inhaler 2 puffs every 4-6 hours as needed for cough/wheeze/shortness of breath/chest tightness.  May use 15-20 minutes prior to activity.   Monitor frequency of use.    - continue montelukast 10 mg daily-take at bedtime  - increase to Flovent 113mcg 2 puffs twice a day at this time.     - Asthma Action Plan (initiate during asthma flares or respiratory illness): take Flovent 3 puffs 3 times a day for 1-2 weeks or until symptoms have resolved then resume your daily maintenance dosing   Control goals:  Full participation in all desired activities (may need albuterol before activity) Albuterol use two time or less a week on average (not counting use with activity) Cough interfering with sleep two time or less a month Oral steroids no more than once a year No hospitalizations  Allergies  - continue avoidance measures for grass pollens, tree pollens, mold, dust mites, cat, dog, cockroach  - for allergy symptom relief recommend use of long-acting antihistamine like Allegra 180 mg, Zyrtec 10 mg or Xyzal 5 mg as needed  - for watery eyes Pataday 1 drop each eye daily as needed  Reflux  - continue daily omeprazole for control of reflux   Follow-up 4-6 months or sooner if needed

## 2021-09-05 DIAGNOSIS — M810 Age-related osteoporosis without current pathological fracture: Secondary | ICD-10-CM | POA: Diagnosis not present

## 2021-09-05 DIAGNOSIS — R7309 Other abnormal glucose: Secondary | ICD-10-CM | POA: Diagnosis not present

## 2021-09-05 DIAGNOSIS — Z79899 Other long term (current) drug therapy: Secondary | ICD-10-CM | POA: Diagnosis not present

## 2021-09-05 DIAGNOSIS — E7800 Pure hypercholesterolemia, unspecified: Secondary | ICD-10-CM | POA: Diagnosis not present

## 2021-09-11 DIAGNOSIS — Z1389 Encounter for screening for other disorder: Secondary | ICD-10-CM | POA: Diagnosis not present

## 2021-09-11 DIAGNOSIS — Z0000 Encounter for general adult medical examination without abnormal findings: Secondary | ICD-10-CM | POA: Diagnosis not present

## 2021-09-12 DIAGNOSIS — L814 Other melanin hyperpigmentation: Secondary | ICD-10-CM | POA: Diagnosis not present

## 2021-09-12 DIAGNOSIS — L84 Corns and callosities: Secondary | ICD-10-CM | POA: Diagnosis not present

## 2021-09-12 DIAGNOSIS — L80 Vitiligo: Secondary | ICD-10-CM | POA: Diagnosis not present

## 2021-09-12 DIAGNOSIS — D1801 Hemangioma of skin and subcutaneous tissue: Secondary | ICD-10-CM | POA: Diagnosis not present

## 2021-09-12 DIAGNOSIS — L821 Other seborrheic keratosis: Secondary | ICD-10-CM | POA: Diagnosis not present

## 2021-09-20 DIAGNOSIS — R7303 Prediabetes: Secondary | ICD-10-CM | POA: Diagnosis not present

## 2021-09-20 DIAGNOSIS — N390 Urinary tract infection, site not specified: Secondary | ICD-10-CM | POA: Diagnosis not present

## 2021-09-20 DIAGNOSIS — M816 Localized osteoporosis [Lequesne]: Secondary | ICD-10-CM | POA: Diagnosis not present

## 2021-09-20 DIAGNOSIS — M1990 Unspecified osteoarthritis, unspecified site: Secondary | ICD-10-CM | POA: Diagnosis not present

## 2021-09-20 DIAGNOSIS — J4530 Mild persistent asthma, uncomplicated: Secondary | ICD-10-CM | POA: Diagnosis not present

## 2021-09-20 DIAGNOSIS — E7800 Pure hypercholesterolemia, unspecified: Secondary | ICD-10-CM | POA: Diagnosis not present

## 2021-09-20 DIAGNOSIS — K219 Gastro-esophageal reflux disease without esophagitis: Secondary | ICD-10-CM | POA: Diagnosis not present

## 2021-09-20 DIAGNOSIS — E538 Deficiency of other specified B group vitamins: Secondary | ICD-10-CM | POA: Diagnosis not present

## 2021-09-20 DIAGNOSIS — F411 Generalized anxiety disorder: Secondary | ICD-10-CM | POA: Diagnosis not present

## 2021-09-26 DIAGNOSIS — N3281 Overactive bladder: Secondary | ICD-10-CM | POA: Diagnosis not present

## 2021-09-26 DIAGNOSIS — Z818 Family history of other mental and behavioral disorders: Secondary | ICD-10-CM | POA: Diagnosis not present

## 2021-09-26 DIAGNOSIS — N3020 Other chronic cystitis without hematuria: Secondary | ICD-10-CM | POA: Diagnosis not present

## 2021-10-14 DIAGNOSIS — S9032XA Contusion of left foot, initial encounter: Secondary | ICD-10-CM | POA: Diagnosis not present

## 2021-10-17 DIAGNOSIS — H903 Sensorineural hearing loss, bilateral: Secondary | ICD-10-CM | POA: Diagnosis not present

## 2021-10-17 DIAGNOSIS — H9313 Tinnitus, bilateral: Secondary | ICD-10-CM | POA: Diagnosis not present

## 2021-11-13 DIAGNOSIS — E7800 Pure hypercholesterolemia, unspecified: Secondary | ICD-10-CM | POA: Diagnosis not present

## 2021-11-13 DIAGNOSIS — R7303 Prediabetes: Secondary | ICD-10-CM | POA: Diagnosis not present

## 2021-11-23 DIAGNOSIS — S9032XD Contusion of left foot, subsequent encounter: Secondary | ICD-10-CM | POA: Diagnosis not present

## 2021-12-10 DIAGNOSIS — S9032XD Contusion of left foot, subsequent encounter: Secondary | ICD-10-CM | POA: Diagnosis not present

## 2021-12-14 DIAGNOSIS — H43821 Vitreomacular adhesion, right eye: Secondary | ICD-10-CM | POA: Diagnosis not present

## 2021-12-17 DIAGNOSIS — E538 Deficiency of other specified B group vitamins: Secondary | ICD-10-CM | POA: Diagnosis not present

## 2021-12-17 DIAGNOSIS — R7303 Prediabetes: Secondary | ICD-10-CM | POA: Diagnosis not present

## 2021-12-17 DIAGNOSIS — R7309 Other abnormal glucose: Secondary | ICD-10-CM | POA: Diagnosis not present

## 2021-12-17 DIAGNOSIS — E7800 Pure hypercholesterolemia, unspecified: Secondary | ICD-10-CM | POA: Diagnosis not present

## 2021-12-24 DIAGNOSIS — M84375A Stress fracture, left foot, initial encounter for fracture: Secondary | ICD-10-CM | POA: Diagnosis not present

## 2021-12-28 DIAGNOSIS — R350 Frequency of micturition: Secondary | ICD-10-CM | POA: Diagnosis not present

## 2021-12-28 DIAGNOSIS — N3020 Other chronic cystitis without hematuria: Secondary | ICD-10-CM | POA: Diagnosis not present

## 2021-12-28 DIAGNOSIS — N3941 Urge incontinence: Secondary | ICD-10-CM | POA: Diagnosis not present

## 2021-12-28 DIAGNOSIS — N3281 Overactive bladder: Secondary | ICD-10-CM | POA: Diagnosis not present

## 2022-01-03 DIAGNOSIS — R350 Frequency of micturition: Secondary | ICD-10-CM | POA: Diagnosis not present

## 2022-01-09 DIAGNOSIS — R140 Abdominal distension (gaseous): Secondary | ICD-10-CM | POA: Diagnosis not present

## 2022-01-09 DIAGNOSIS — R350 Frequency of micturition: Secondary | ICD-10-CM | POA: Diagnosis not present

## 2022-01-09 DIAGNOSIS — M84375D Stress fracture, left foot, subsequent encounter for fracture with routine healing: Secondary | ICD-10-CM | POA: Diagnosis not present

## 2022-01-09 DIAGNOSIS — R351 Nocturia: Secondary | ICD-10-CM | POA: Diagnosis not present

## 2022-01-09 DIAGNOSIS — N3941 Urge incontinence: Secondary | ICD-10-CM | POA: Diagnosis not present

## 2022-01-09 DIAGNOSIS — N952 Postmenopausal atrophic vaginitis: Secondary | ICD-10-CM | POA: Diagnosis not present

## 2022-01-09 DIAGNOSIS — N3281 Overactive bladder: Secondary | ICD-10-CM | POA: Diagnosis not present

## 2022-01-15 DIAGNOSIS — N390 Urinary tract infection, site not specified: Secondary | ICD-10-CM | POA: Diagnosis not present

## 2022-01-15 DIAGNOSIS — F411 Generalized anxiety disorder: Secondary | ICD-10-CM | POA: Diagnosis not present

## 2022-01-15 DIAGNOSIS — J4530 Mild persistent asthma, uncomplicated: Secondary | ICD-10-CM | POA: Diagnosis not present

## 2022-01-15 DIAGNOSIS — I2510 Atherosclerotic heart disease of native coronary artery without angina pectoris: Secondary | ICD-10-CM | POA: Diagnosis not present

## 2022-01-15 DIAGNOSIS — E7800 Pure hypercholesterolemia, unspecified: Secondary | ICD-10-CM | POA: Diagnosis not present

## 2022-01-15 DIAGNOSIS — K219 Gastro-esophageal reflux disease without esophagitis: Secondary | ICD-10-CM | POA: Diagnosis not present

## 2022-01-15 DIAGNOSIS — R140 Abdominal distension (gaseous): Secondary | ICD-10-CM | POA: Diagnosis not present

## 2022-01-15 DIAGNOSIS — R32 Unspecified urinary incontinence: Secondary | ICD-10-CM | POA: Diagnosis not present

## 2022-01-17 ENCOUNTER — Ambulatory Visit: Payer: Medicare PPO | Admitting: Allergy

## 2022-01-17 ENCOUNTER — Encounter: Payer: Self-pay | Admitting: Allergy

## 2022-01-17 VITALS — BP 120/78 | HR 74 | Temp 98.5°F | Resp 16 | Ht 62.0 in | Wt 126.8 lb

## 2022-01-17 DIAGNOSIS — J301 Allergic rhinitis due to pollen: Secondary | ICD-10-CM | POA: Diagnosis not present

## 2022-01-17 DIAGNOSIS — H1013 Acute atopic conjunctivitis, bilateral: Secondary | ICD-10-CM | POA: Diagnosis not present

## 2022-01-17 DIAGNOSIS — K219 Gastro-esophageal reflux disease without esophagitis: Secondary | ICD-10-CM | POA: Diagnosis not present

## 2022-01-17 DIAGNOSIS — J4540 Moderate persistent asthma, uncomplicated: Secondary | ICD-10-CM | POA: Diagnosis not present

## 2022-01-17 MED ORDER — FLUTICASONE PROPIONATE HFA 110 MCG/ACT IN AERO: 2.0000 | INHALATION_SPRAY | Freq: Two times a day (BID) | RESPIRATORY_TRACT | 5 refills | Status: DC

## 2022-01-17 NOTE — Progress Notes (Signed)
Follow-up Note  RE: Tiffany Reyes MRN: [id number] DOB: 1951-02-25 Date of Office Visit: 01/17/2022   History of present illness: Tiffany Reyes is a 71 y.o. female presenting today for follow-up of asthmatic bronchitis, allergic rhinitis with conjunctivitis and reflux.  She was last seen in the office on 08/30/2021 by myself.  She has been using flovent consistently and has been doing relatively well in regards to her asthma control.  She has used albuterol couple times since last vsit due to chest tightness/pressure sensation that was relived with albuterol.  She has not required any systemic steroids or need to go to an ED or urgent care.  She has not needed to increase her Flovent use for the asthma action plan dosing.   She has noted more nasal drainage this season but states has been gone over the past week.  She will let us kknow if the nasal drainge returns.  She has not needed to use the eyedrop much and she does have access to an antihistamine for as needed use.  She does continue to omeprazole for reflux control.  [place] and did well while she was there in regards to her asthma.  [place]    [place].  Review of systems: Review of Systems  Constitutional: Negative.   HENT:         See HPI  Eyes: Negative.   Respiratory:         See HPI  Cardiovascular: Negative.   Gastrointestinal: Negative.   Musculoskeletal: Negative.   Skin: Negative.   Allergic/Immunologic: Negative.   Neurological: Negative.      All other systems negative unless noted above in HPI  Past medical/social/surgical/family history have been reviewed and are unchanged unless specifically indicated below.  No changes  Medication List: Current Outpatient Medications  Medication Sig Dispense Refill   acetaminophen (TYLENOL) 500 MG tablet Take 500 mg by mouth as needed.      albuterol (VENTOLIN HFA) 108 (90 Base) MCG/ACT inhaler Inhale 2 puffs into the lungs every 6 (six) hours as needed for wheezing or shortness of breath. 18 g 1   calcium carbonate (TUMS EX) 750 MG chewable tablet Chew 1 tablet by mouth as needed for heartburn.     Cholecalciferol (VITAMIN D3) 50 MCG (2000 UT) capsule 1 capsule     escitalopram (LEXAPRO) 5 MG tablet Take 5 mg by mouth daily.     montelukast (SINGULAIR) 10 MG tablet TAKE ONE TABLET AT BEDTIME. 30 tablet 5   NON FORMULARY Take 2 capsules by mouth in the morning and at bedtime. HYDROEYE     Olopatadine HCl (PATADAY) 0.2 % SOLN Place 1 drop into both eyes daily as needed (Itchy/watery eyes). 2.5 mL 5   omeprazole (PRILOSEC) 40 MG capsule Take one capsule three times a week if symptoms persist then can go back to daily 90 capsule 3   rosuvastatin (CRESTOR) 20 MG tablet Take 40 mg by mouth at bedtime.     atorvastatin (LIPITOR) 20 MG tablet 20 mg. (Patient not taking: Reported on 01/17/2022)     fluticasone (FLOVENT HFA) 110 MCG/ACT inhaler Inhale 2 puffs into the lungs 2 (two) times daily. 1 each 5   metroNIDAZOLE (METROCREAM) 0.75 % cream Apply topically every morning. (Patient not taking: Reported on 01/17/2022)     Zinc 50  MG TABS Take 50 mg by mouth daily. (Patient not taking: Reported on 01/17/2022)     No current facility-administered medications for this visit.     Known medication allergies: Allergies  Allergen Reactions   Ambien [Zolpidem]    Alendronate Other (See Comments)   Codeine Nausea Only   Eszopiclone Other (See Comments)   Fluconazole Hives    Veins stood out   Moxifloxacin Hcl Other (See Comments)    Headache, bodyache   Sulfa Antibiotics Other (See Comments)    Unknown reaction   Tetanus Toxoids Swelling    August 1976 last tetanus expierenced intense fever. Swelling in arm.and Neck    Trazodone Other (See Comments)    Hangover   Excedrin Pm [Diphenhydramine-Apap (Sleep)]    Penicillins Rash    Has  patient had a PCN reaction causing immediate rash, facial/tongue/throat swelling, SOB or lightheadedness with hypotension: Yes Has patient had a PCN reaction causing severe rash involving mucus membranes or skin necrosis: No Has patient had a PCN reaction that required hospitalization No Has patient had a PCN reaction occurring within the last 10 years: No If all of the above answers are "NO", then may proceed with Cephalosporin use.      Physical examination: Blood pressure 120/78, pulse 74, temperature 98.5 F (36.9 C), temperature source Temporal, resp. rate 16, height 5\' 2"  (1.575 m), weight 126 lb 12.8 oz (57.5 kg), SpO2 97 %.  General: Alert, interactive, in no acute distress. HEENT: PERRLA, TMs pearly gray, turbinates non-edematous without discharge, post-pharynx non erythematous. Neck: Supple without lymphadenopathy. Lungs: Clear to auscultation without wheezing, rhonchi or rales. {no increased work of breathing. CV: Normal S1, S2 without murmurs. Abdomen: Nondistended, nontender. Skin: Warm and dry, without lesions or rashes. Extremities:  No clubbing, cyanosis or edema. Neuro:   Grossly intact.  Diagnositics/Labs:  Spirometry: FEV1: 1.72L 86%, FVC: 2.15L 81%, ratio consistent with nonobstructive pattern  Assessment and plan: Asthmatic bronchitis   - lung function testing is normal today and much better than last visit!  - have access to albuterol inhaler 2 puffs every 4-6 hours as needed for cough/wheeze/shortness of breath/chest tightness.  May use 15-20 minutes prior to activity.   Monitor frequency of use.    - continue montelukast 10 mg daily-take at bedtime  - continue Flovent 2 puffs twice a day at this time.     - Asthma Action Plan (initiate during asthma flares or respiratory illness): take Flovent 3 puffs 3 times a day for 1-2 weeks or until symptoms have resolved then resume your daily maintenance dosing   Control goals:  Full participation in all  desired activities (may need albuterol before activity) Albuterol use two time or less a week on average (not counting use with activity) Cough interfering with sleep two time or less a month Oral steroids no more than once a year No hospitalizations  Allergies  - continue avoidance measures for grass pollens, tree pollens, mold, dust mites, cat, dog, cockroach  - for allergy symptom relief recommend use of long-acting antihistamine like Allegra 180 mg, Zyrtec 10 mg or Xyzal 5 mg as needed  - for watery eyes Pataday 1 drop each eye daily as needed  - let know if nasal drainage returns.  You can use over-the-counter Astepro 2 sprays each nostril 1-2 times a day.    Reflux  - continue daily omeprazole for control of reflux   Follow-up 6 months or sooner if needed   I appreciate the opportunity  to take part in Yaneisy's care. Please do not hesitate to contact me with questions.  Sincerely,   Margo Aye, MD Allergy/Immunology [facility]

## 2022-01-17 NOTE — Patient Instructions (Addendum)
Asthmatic bronchitis   - lung function testing is normal today and much better than last visit!  - have access to albuterol inhaler 2 puffs every 4-6 hours as needed for cough/wheeze/shortness of breath/chest tightness.  May use 15-20 minutes prior to activity.   Monitor frequency of use.    - continue montelukast 10 mg daily-take at bedtime  - continue Flovent 2 puffs twice a day at this time.     - Asthma Action Plan (initiate during asthma flares or respiratory illness): take Flovent 3 puffs 3 times a day for 1-2 weeks or until symptoms have resolved then resume your daily maintenance dosing   Control goals:  Full participation in all desired activities (may need albuterol before activity) Albuterol use two time or less a week on average (not counting use with activity) Cough interfering with sleep two time or less a month Oral steroids no more than once a year No hospitalizations  Allergies  - continue avoidance measures for grass pollens, tree pollens, mold, dust mites, cat, dog, cockroach  - for allergy symptom relief recommend use of long-acting antihistamine like Allegra 180 mg, Zyrtec 10 mg or Xyzal 5 mg as needed  - for watery eyes Pataday 1 drop each eye daily as needed  - let us know if nasal drainage returns.  You can use over-the-counter Astepro 2 sprays each nostril 1-2 times a day.    Reflux  - continue daily omeprazole for control of reflux   Follow-up 6 months or sooner if needed

## 2022-02-05 DIAGNOSIS — M810 Age-related osteoporosis without current pathological fracture: Secondary | ICD-10-CM | POA: Diagnosis not present

## 2022-02-05 DIAGNOSIS — E213 Hyperparathyroidism, unspecified: Secondary | ICD-10-CM | POA: Diagnosis not present

## 2022-02-07 DIAGNOSIS — H04123 Dry eye syndrome of bilateral lacrimal glands: Secondary | ICD-10-CM | POA: Diagnosis not present

## 2022-02-07 DIAGNOSIS — G43119 Migraine with aura, intractable, without status migrainosus: Secondary | ICD-10-CM | POA: Diagnosis not present

## 2022-02-07 DIAGNOSIS — H18591 Other hereditary corneal dystrophies, right eye: Secondary | ICD-10-CM | POA: Diagnosis not present

## 2022-02-20 DIAGNOSIS — M84375D Stress fracture, left foot, subsequent encounter for fracture with routine healing: Secondary | ICD-10-CM | POA: Diagnosis not present

## 2022-02-28 DIAGNOSIS — M79672 Pain in left foot: Secondary | ICD-10-CM | POA: Diagnosis not present

## 2022-03-04 DIAGNOSIS — M79672 Pain in left foot: Secondary | ICD-10-CM | POA: Diagnosis not present

## 2022-03-07 DIAGNOSIS — S92342G Displaced fracture of fourth metatarsal bone, left foot, subsequent encounter for fracture with delayed healing: Secondary | ICD-10-CM | POA: Diagnosis not present

## 2022-03-18 DIAGNOSIS — S92342G Displaced fracture of fourth metatarsal bone, left foot, subsequent encounter for fracture with delayed healing: Secondary | ICD-10-CM | POA: Diagnosis not present

## 2022-03-19 DIAGNOSIS — H18591 Other hereditary corneal dystrophies, right eye: Secondary | ICD-10-CM | POA: Diagnosis not present

## 2022-03-19 DIAGNOSIS — H2513 Age-related nuclear cataract, bilateral: Secondary | ICD-10-CM | POA: Diagnosis not present

## 2022-03-19 DIAGNOSIS — H0102A Squamous blepharitis right eye, upper and lower eyelids: Secondary | ICD-10-CM | POA: Diagnosis not present

## 2022-03-19 DIAGNOSIS — H04123 Dry eye syndrome of bilateral lacrimal glands: Secondary | ICD-10-CM | POA: Diagnosis not present

## 2022-03-21 ENCOUNTER — Emergency Department (HOSPITAL_COMMUNITY): Payer: Medicare PPO

## 2022-03-21 ENCOUNTER — Other Ambulatory Visit: Payer: Self-pay

## 2022-03-21 ENCOUNTER — Emergency Department (HOSPITAL_COMMUNITY)
Admission: EM | Admit: 2022-03-21 | Discharge: 2022-03-21 | Disposition: A | Discharge status: Home / Self Care | Payer: Medicare PPO | Attending: Emergency Medicine | Admitting: Emergency Medicine

## 2022-03-21 ENCOUNTER — Encounter (HOSPITAL_COMMUNITY): Payer: Self-pay

## 2022-03-21 DIAGNOSIS — W010XXA Fall on same level from slipping, tripping and stumbling without subsequent striking against object, initial encounter: Secondary | ICD-10-CM | POA: Insufficient documentation

## 2022-03-21 DIAGNOSIS — S0990XA Unspecified injury of head, initial encounter: Secondary | ICD-10-CM | POA: Diagnosis not present

## 2022-03-21 DIAGNOSIS — M50321 Other cervical disc degeneration at C4-C5 level: Secondary | ICD-10-CM | POA: Diagnosis not present

## 2022-03-21 DIAGNOSIS — S199XXA Unspecified injury of neck, initial encounter: Secondary | ICD-10-CM | POA: Diagnosis not present

## 2022-03-21 NOTE — ED Triage Notes (Signed)
Patient reports that she slid on a slick floor 4 days ago and hit the back of her head. Patient states she still has an intermittent mild headache. Patient states she had nausea this AM. Patient denies blurred vision or blood thinners.

## 2022-03-21 NOTE — Discharge Instructions (Signed)
Please read and follow all provided instructions.  Your diagnoses today include:  1. Injury of head, initial encounter     Tests performed today include: CT scan of your head and cervical spine that did not show any serious injury. Vital signs. See below for your results today.   Medications prescribed:  None  Take any prescribed medications only as directed.  Home care instructions:  Follow any educational materials contained in this packet.  BE VERY CAREFUL not to take multiple medicines containing Tylenol (also called acetaminophen). Doing so can lead to an overdose which can damage your liver and cause liver failure and possibly death.   Follow-up instructions: Please follow-up with your primary care provider as needed for further evaluation of your symptoms.   Return instructions:  SEEK IMMEDIATE MEDICAL ATTENTION IF: There is confusion or drowsiness (although children frequently become drowsy after injury).  You cannot awaken the injured person.  You have more than one episode of vomiting.  You notice dizziness or unsteadiness which is getting worse, or inability to walk.  You have convulsions or unconsciousness.  You experience severe, persistent headaches not relieved by Tylenol. You cannot use arms or legs normally.  There are changes in pupil sizes. (This is the black center in the colored part of the eye)  There is clear or bloody discharge from the nose or ears.  You have change in speech, vision, swallowing, or understanding.  Localized weakness, numbness, tingling, or change in bowel or bladder control. You have any other emergent concerns.  Additional Information: You have had a head injury which does not appear to require admission at this time.  Your vital signs today were: BP (!) 141/77 (BP Location: Left Arm)   Pulse 64   Temp 98.3 F (36.8 C) (Oral)   Resp 17   Ht 5\' 2"  (1.575 m)   Wt 55.3 kg   SpO2 100%   BMI 22.31 kg/m  If your blood pressure  (BP) was elevated above 135/85 this visit, please have this repeated by your doctor within one month. --------------

## 2022-03-21 NOTE — ED Provider Notes (Signed)
[facility] Provider Note   CSN: [id number] Arrival date & time: 03/21/22  1016     History  Chief Complaint  Patient presents with   Fall   Head Injury    Tiffany Reyes is a 71 y.o. female.  Patient presents today for evaluation after a head injury occurring about 4 days ago.  States that her doctor recommended that she come in.  She states that she slipped on the floor and fell backwards, striking the back of her head.  She did not lose consciousness.  She is not on anticoagulation.  She has had mild intermittent headaches which have been waxing and waning.  No vision changes/loss or vomiting.  No neck pain, weakness, numbness, or tingling in the arms or legs.  She has had some nausea.  She has not taken any medications for her symptoms.       Home Medications Prior to Admission medications   Medication Sig Start Date End Date Taking? Authorizing Provider  acetaminophen (TYLENOL) 500 MG tablet Take 500 mg by mouth as needed.    [provider]  albuterol (VENTOLIN HFA) 108 (90 Base) MCG/ACT inhaler Inhale 2 puffs into the lungs every 6 (six) hours as needed for wheezing or shortness of breath. 08/30/21   Marcelyn Bruins, MD  atorvastatin (LIPITOR) 20 MG tablet 20 mg. Patient not taking: Reported on 01/17/2022 09/13/20   [provider]  calcium carbonate (TUMS EX) 750 MG chewable tablet Chew 1 tablet by mouth as needed for heartburn.    [provider]  Cholecalciferol (VITAMIN D3) 50 MCG (2000 UT) capsule 1 capsule 09/13/20   [provider]  escitalopram (LEXAPRO) 5 MG tablet Take 5 mg by mouth daily.    [provider]  fluticasone (FLOVENT HFA) 110 MCG/ACT inhaler Inhale 2 puffs into the lungs 2 (two) times daily. 01/17/22   Marcelyn Bruins, MD  metroNIDAZOLE (METROCREAM) 0.75 % cream Apply topically every morning. Patient not taking: Reported on 01/17/2022    [provider]   montelukast (SINGULAIR) 10 MG tablet TAKE ONE TABLET AT BEDTIME. 06/01/21   Padgett, Pilar Grammes, MD  NON FORMULARY Take 2 capsules by mouth in the morning and at bedtime. HYDROEYE    [provider]  Olopatadine HCl (PATADAY) 0.2 % SOLN Place 1 drop into both eyes daily as needed (Itchy/watery eyes). 10/27/20   Marcelyn Bruins, MD  omeprazole (PRILOSEC) 40 MG capsule Take one capsule three times a week if symptoms persist then can go back to daily 08/30/21   Marcelyn Bruins, MD  rosuvastatin (CRESTOR) 20 MG tablet Take 40 mg by mouth at bedtime. 01/15/22   [provider]  Zinc 50 MG TABS Take 50 mg by mouth daily. Patient not taking: Reported on 01/17/2022    [provider]      Allergies    Ambien [zolpidem], Alendronate, Codeine, Eszopiclone, Fluconazole, Moxifloxacin hcl, Sulfa antibiotics, Tetanus toxoids, Trazodone, Excedrin pm [diphenhydramine-apap (sleep)], and Penicillins    Review of Systems   Review of Systems  Physical Exam Updated Vital Signs BP (!) 141/77 (BP Location: Left Arm)   Pulse 64   Temp 98.3 F (36.8 C) (Oral)   Resp 17   Ht 5\' 2"  (1.575 m)   Wt 55.3 kg   SpO2 100%   BMI 22.31 kg/m  Physical Exam Vitals and nursing note reviewed.  Constitutional:      Appearance: She is well-developed.  HENT:  Head: Normocephalic and atraumatic. No raccoon eyes or Battle's sign.     Right Ear: Tympanic membrane, ear canal and external ear normal. No hemotympanum.     Left Ear: Tympanic membrane, ear canal and external ear normal. No hemotympanum.     Nose: Nose normal.     Mouth/Throat:     Pharynx: Uvula midline.  Eyes:     General: Lids are normal.     Extraocular Movements:     Right eye: No nystagmus.     Left eye: No nystagmus.     Conjunctiva/sclera: Conjunctivae normal.     Pupils: Pupils are equal, round, and reactive to light.     Comments: No visible hyphema noted  Cardiovascular:     Rate and Rhythm:  Normal rate and regular rhythm.  Pulmonary:     Effort: Pulmonary effort is normal.     Breath sounds: Normal breath sounds.  Abdominal:     Palpations: Abdomen is soft.     Tenderness: There is no abdominal tenderness.  Musculoskeletal:     Cervical back: Normal range of motion and neck supple. No tenderness or bony tenderness.     Thoracic back: No tenderness or bony tenderness.  Skin:    General: Skin is warm and dry.  Neurological:     Mental Status: She is alert and oriented to person, place, and time.     GCS: GCS eye subscore is 4. GCS verbal subscore is 5. GCS motor subscore is 6.     Cranial Nerves: No cranial nerve deficit.     Sensory: No sensory deficit.     Coordination: Coordination normal.     Deep Tendon Reflexes: Reflexes are normal and symmetric.     ED Results / Procedures / Treatments   Labs (all labs ordered are listed, but only abnormal results are displayed) Labs Reviewed - No data to display  EKG None  Radiology CT HEAD WO CONTRAST ( )  Result Date: 03/21/2022 CLINICAL DATA:  Head trauma, minor (Age >= 65y); Neck trauma (Age >= 65y) EXAM: CT HEAD WITHOUT CONTRAST CT CERVICAL SPINE WITHOUT CONTRAST TECHNIQUE: Multidetector CT imaging of the head and cervical spine was performed following the standard protocol without intravenous contrast. Multiplanar CT image reconstructions of the cervical spine were also generated. RADIATION DOSE REDUCTION: This exam was performed according to the departmental dose-optimization program which includes automated exposure control, adjustment of the mA and/or kV according to patient size and/or use of iterative reconstruction technique. COMPARISON:  None Available. FINDINGS: CT HEAD FINDINGS Brain: No evidence of acute infarction, hemorrhage, hydrocephalus, extra-axial collection or mass lesion/mass effect. Scattered low-density changes within the periventricular and subcortical white matter compatible with chronic  microvascular ischemic change. Vascular: No hyperdense vessel or unexpected calcification. Skull: Normal. Negative for fracture or focal lesion. Sinuses/Orbits: No acute finding. Other: Negative for scalp hematoma. CT CERVICAL SPINE FINDINGS Alignment: Facet joints are aligned without dislocation or traumatic listhesis. Dens and lateral masses are aligned. Skull base and vertebrae: No acute fracture. No primary bone lesion or focal pathologic process. Soft tissues and spinal canal: No prevertebral fluid or swelling. No visible canal hematoma. Disc levels: Multi level degenerative disc disease most pronounced at C4-5. No significant facet arthropathy. Upper chest: Included lung apices are clear. Other: None. IMPRESSION: 1. No acute intracranial abnormality. 2. No acute fracture or subluxation of the cervical spine. Electronically Signed   By: Duanne Guess D.O.   On: 03/21/2022 12:47   CT Cervical Spine Wo Contrast  Result Date: 03/21/2022 CLINICAL DATA:  Head trauma, minor (Age >= 65y); Neck trauma (Age >= 65y) EXAM: CT HEAD WITHOUT CONTRAST CT CERVICAL SPINE WITHOUT CONTRAST TECHNIQUE: Multidetector CT imaging of the head and cervical spine was performed following the standard protocol without intravenous contrast. Multiplanar CT image reconstructions of the cervical spine were also generated. RADIATION DOSE REDUCTION: This exam was performed according to the departmental dose-optimization program which includes automated exposure control, adjustment of the mA and/or kV according to patient size and/or use of iterative reconstruction technique. COMPARISON:  None Available. FINDINGS: CT HEAD FINDINGS Brain: No evidence of acute infarction, hemorrhage, hydrocephalus, extra-axial collection or mass lesion/mass effect. Scattered low-density changes within the periventricular and subcortical white matter compatible with chronic microvascular ischemic change. Vascular: No hyperdense vessel or unexpected  calcification. Skull: Normal. Negative for fracture or focal lesion. Sinuses/Orbits: No acute finding. Other: Negative for scalp hematoma. CT CERVICAL SPINE FINDINGS Alignment: Facet joints are aligned without dislocation or traumatic listhesis. Dens and lateral masses are aligned. Skull base and vertebrae: No acute fracture. No primary bone lesion or focal pathologic process. Soft tissues and spinal canal: No prevertebral fluid or swelling. No visible canal hematoma. Disc levels: Multi level degenerative disc disease most pronounced at C4-5. No significant facet arthropathy. Upper chest: Included lung apices are clear. Other: None. IMPRESSION: 1. No acute intracranial abnormality. 2. No acute fracture or subluxation of the cervical spine. Electronically Signed   By: Duanne Guess D.O.   On: 03/21/2022 12:47    Procedures Procedures    Medications Ordered in ED Medications - No data to display  ED Course/ Medical Decision Making/ A&P    Patient seen and examined. History obtained directly from patient.   Labs/EKG: None ordered  Imaging: CT of the head and cervical spine.  Medications/Fluids: None ordered  Most recent vital signs reviewed and are as follows: BP (!) 141/77 (BP Location: Left Arm)   Pulse 64   Temp 98.3 F (36.8 C) (Oral)   Resp 17   Ht 5\' 2"  (1.575 m)   Wt 55.3 kg   SpO2 100%   BMI 22.31 kg/m   Initial impression: Head injury, continued headaches.  12:58 PM Reassessment performed. Patient appears stable.  Imaging personally visualized and interpreted including: CT head and cervical spine agree negative.  Reviewed pertinent lab work and imaging with patient at bedside. Questions answered.   Most current vital signs reviewed and are as follows: BP (!) 141/77 (BP Location: Left Arm)   Pulse 64   Temp 98.3 F (36.8 C) (Oral)   Resp 17   Ht 5\' 2"  (1.575 m)   Wt 55.3 kg   SpO2 100%   BMI 22.31 kg/m   Plan: Discharge to home.   Prescriptions written  for: None  Other home care instructions discussed: Avoidance of triggers  ED return instructions discussed: Patient was counseled on head injury precautions and symptoms that should indicate their return to the ED.  These include severe worsening headache, vision changes, confusion, loss of consciousness, trouble walking, nausea & vomiting, or weakness/tingling in extremities.    Follow-up instructions discussed: Patient encouraged to follow-up with their PCP as needed, especially if symptoms are persistent as this could indicate possible concussion.                          Medical Decision Making Amount and/or Complexity of Data Reviewed Radiology: ordered.   Patient here after sustaining a head injury  4 days ago.  Symptoms as above.  No signs of subdural hematoma or other bleeding on head CT.  No cervical spine fractures noted.  Patient has normal neurologic exam clinically.    The patient's vital signs, pertinent lab work and imaging were reviewed and interpreted as discussed in the ED course. Hospitalization was considered for further testing, treatments, or serial exams/observation. However as patient is well-appearing, has a stable exam, and reassuring studies today, I do not feel that they warrant admission at this time. This plan was discussed with the patient who verbalizes agreement and comfort with this plan and seems reliable and able to return to the Emergency Department with worsening or changing symptoms.          Final Clinical Impression(s) / ED Diagnoses Final diagnoses:  Injury of head, initial encounter    Rx / DC Orders ED Discharge Orders     None         [name], PA-C 03/21/22 1259    Lonell Grandchild, MD 03/21/22 1434

## 2022-03-22 DIAGNOSIS — J4530 Mild persistent asthma, uncomplicated: Secondary | ICD-10-CM | POA: Diagnosis not present

## 2022-03-22 DIAGNOSIS — S060X0D Concussion without loss of consciousness, subsequent encounter: Secondary | ICD-10-CM | POA: Diagnosis not present

## 2022-03-22 DIAGNOSIS — Z23 Encounter for immunization: Secondary | ICD-10-CM | POA: Diagnosis not present

## 2022-03-22 DIAGNOSIS — F411 Generalized anxiety disorder: Secondary | ICD-10-CM | POA: Diagnosis not present

## 2022-03-22 DIAGNOSIS — E7800 Pure hypercholesterolemia, unspecified: Secondary | ICD-10-CM | POA: Diagnosis not present

## 2022-03-22 DIAGNOSIS — K219 Gastro-esophageal reflux disease without esophagitis: Secondary | ICD-10-CM | POA: Diagnosis not present

## 2022-03-22 DIAGNOSIS — W19XXXS Unspecified fall, sequela: Secondary | ICD-10-CM | POA: Diagnosis not present

## 2022-03-22 DIAGNOSIS — R7303 Prediabetes: Secondary | ICD-10-CM | POA: Diagnosis not present

## 2022-03-22 DIAGNOSIS — Z6822 Body mass index (BMI) 22.0-22.9, adult: Secondary | ICD-10-CM | POA: Diagnosis not present

## 2022-04-01 DIAGNOSIS — S92342G Displaced fracture of fourth metatarsal bone, left foot, subsequent encounter for fracture with delayed healing: Secondary | ICD-10-CM | POA: Diagnosis not present

## 2022-04-03 DIAGNOSIS — G44309 Post-traumatic headache, unspecified, not intractable: Secondary | ICD-10-CM | POA: Diagnosis not present

## 2022-04-04 DIAGNOSIS — J069 Acute upper respiratory infection, unspecified: Secondary | ICD-10-CM | POA: Diagnosis not present

## 2022-04-08 DIAGNOSIS — S92342G Displaced fracture of fourth metatarsal bone, left foot, subsequent encounter for fracture with delayed healing: Secondary | ICD-10-CM | POA: Diagnosis not present

## 2022-04-12 DIAGNOSIS — F411 Generalized anxiety disorder: Secondary | ICD-10-CM | POA: Diagnosis not present

## 2022-04-12 DIAGNOSIS — Z6822 Body mass index (BMI) 22.0-22.9, adult: Secondary | ICD-10-CM | POA: Diagnosis not present

## 2022-04-12 DIAGNOSIS — S060X0S Concussion without loss of consciousness, sequela: Secondary | ICD-10-CM | POA: Diagnosis not present

## 2022-04-12 DIAGNOSIS — J4530 Mild persistent asthma, uncomplicated: Secondary | ICD-10-CM | POA: Diagnosis not present

## 2022-04-18 ENCOUNTER — Encounter: Payer: Self-pay | Admitting: Allergy

## 2022-04-18 ENCOUNTER — Ambulatory Visit: Payer: Medicare PPO | Admitting: Allergy

## 2022-04-18 VITALS — BP 116/70 | HR 62 | Temp 98.6°F | Resp 18

## 2022-04-18 DIAGNOSIS — H1013 Acute atopic conjunctivitis, bilateral: Secondary | ICD-10-CM | POA: Diagnosis not present

## 2022-04-18 DIAGNOSIS — J4541 Moderate persistent asthma with (acute) exacerbation: Secondary | ICD-10-CM

## 2022-04-18 DIAGNOSIS — K219 Gastro-esophageal reflux disease without esophagitis: Secondary | ICD-10-CM

## 2022-04-18 DIAGNOSIS — J301 Allergic rhinitis due to pollen: Secondary | ICD-10-CM

## 2022-04-18 MED ORDER — BUDESONIDE-FORMOTEROL FUMARATE 160-4.5 MCG/ACT IN AERO
2.0000 | INHALATION_SPRAY | Freq: Two times a day (BID) | RESPIRATORY_TRACT | 5 refills | Status: DC
Start: 2022-04-18 — End: 2022-11-20

## 2022-04-18 NOTE — Progress Notes (Signed)
Follow-up Note  RE: Tiffany Reyes MRN: [id number] DOB: Mar 26, 1951 Date of Office Visit: 04/18/2022   History of present illness: Tiffany Reyes is a 71 y.o. female presenting today for follow-up of asthmatic bronchitis and allergic rhinitis as well as reflux.  She was last seen in the office on 01/17/2022 by myself.  She fell on 03/17/22 and got a concussion after falling on wet tile in her foyer.  Within that next week she picked up a viral URI.  She did test negative for Covid and RSV.  In the beginning she reports some runny nose and light cough.  As weeks has passed symptoms have worsened.  The cough has worsened but states this Tuesday cough did seem to get better. She did seen her PCP on 04/09/22 for symptoms and states recommended she triple her flovent use (performed the asthma action plan dosing).  She states she has been recommended to use medication for the headache like Tylenol.  She has used albuterol inhaler more due to the URI as well as prior to activity like bringing in the trash can which has become more laborsome for her.  She does continue montelukast daily.  She also has been using her Flovent either at the typical twice a day dosing more at the triple dosing for her asthma action plan. She will be going to [place] for a 14-day trip next week and that needs to be better as she does plan to do a lot of walking walking while there and at this time walking is even difficult as it promotes symptoms.    Review of systems: Review of Systems  Constitutional: Negative.   HENT:         See HPI  Eyes: Negative.   Respiratory:         See HPI  Cardiovascular: Negative.   Gastrointestinal: Negative.   Musculoskeletal: Negative.   Skin: Negative.   Allergic/Immunologic: Negative.   Neurological:        See HPI     All other systems negative unless noted above in HPI  Past medical/social/surgical/family history have been reviewed and are unchanged unless specifically indicated  below.  No changes  Medication List: Current Outpatient Medications  Medication Sig Dispense Refill   acetaminophen (TYLENOL) 500 MG tablet Take 500 mg by mouth as needed.     albuterol (VENTOLIN HFA) 108 (90 Base) MCG/ACT inhaler Inhale 2 puffs into the lungs every 6 (six) hours as needed for wheezing or shortness of breath. 18 g 1   budesonide-formoterol (SYMBICORT) 160-4.5 MCG/ACT inhaler Inhale 2 puffs into the lungs 2 (two) times daily. 1 each 5   calcium carbonate (TUMS EX) 750 MG chewable tablet Chew 1 tablet by mouth as needed for heartburn.     Cholecalciferol (VITAMIN D3) 50 MCG (2000 UT) capsule 1 capsule     escitalopram (LEXAPRO) 5 MG tablet Take 10 mg by mouth daily.     fluticasone (FLOVENT HFA) 110 MCG/ACT inhaler Inhale 2 puffs into the lungs 2 (two) times daily. 1 each 5   metroNIDAZOLE (METROCREAM) 0.75 % cream Apply topically every morning.     montelukast (SINGULAIR) 10 MG tablet TAKE ONE TABLET AT BEDTIME. 30 tablet 5   NON FORMULARY Take 2 capsules by mouth in the morning and at bedtime. HYDROEYE     Olopatadine HCl (PATADAY) 0.2 % SOLN Place 1 drop into both eyes daily as needed (Itchy/watery eyes). 2.5 mL 5   omeprazole (PRILOSEC) 40 MG capsule  Take one capsule three times a week if symptoms persist then can go back to daily 90 capsule 3   rosuvastatin (CRESTOR) 20 MG tablet Take 40 mg by mouth at bedtime.     Zinc 50 MG TABS Take 50 mg by mouth daily.     atorvastatin (LIPITOR) 20 MG tablet 20 mg. (Patient not taking: Reported on 01/17/2022)     No current facility-administered medications for this visit.     Known medication allergies: Allergies  Allergen Reactions   Ambien [Zolpidem]    Alendronate Other (See Comments)   Codeine Nausea Only   Eszopiclone Other (See Comments)   Fluconazole Hives    Veins stood out   Moxifloxacin Hcl Other (See Comments)    Headache, bodyache   Sulfa Antibiotics Other (See Comments)    Unknown reaction   Tetanus Toxoids  Swelling    August 1976 last tetanus expierenced intense fever. Swelling in arm.and Neck    Trazodone Other (See Comments)    Hangover   Excedrin Pm [Diphenhydramine-Apap (Sleep)]    Penicillins Rash    Has patient had a PCN reaction causing immediate rash, facial/tongue/throat swelling, SOB or lightheadedness with hypotension: Yes Has patient had a PCN reaction causing severe rash involving mucus membranes or skin necrosis: No Has patient had a PCN reaction that required hospitalization No Has patient had a PCN reaction occurring within the last 10 years: No If all of the above answers are "NO", then may proceed with Cephalosporin use.      Physical examination: Blood pressure 116/70, pulse 62, temperature 98.6 F (37 C), resp. rate 18, SpO2 97 %.  General: Alert, interactive, in no acute distress. HEENT: PERRLA, TMs pearly gray, turbinates non-edematous without discharge, post-pharynx non erythematous. Neck: Supple without lymphadenopathy. Lungs: Clear to auscultation without wheezing, rhonchi or rales. {no increased work of breathing. CV: Normal S1, S2 without murmurs. Abdomen: Nondistended, nontender. Skin: Warm and dry, without lesions or rashes. Extremities:  No clubbing, cyanosis or edema. Neuro:   Grossly intact.  Diagnositics/Labs:  Spirometry: FEV1: 1.43L 72%, FVC: 1.89L 73% predicted.  This is not a bad appearing study it is definitely lower than her previous study  Assessment and plan:   Asthmatic bronchitis   - lung function testing is lower today and reflective of recent respiratory illness  - have access to albuterol inhaler 2 puffs every 4-6 hours as needed for cough/wheeze/shortness of breath/chest tightness.  May use 15-20 minutes prior to activity.   Monitor frequency of use.    - continue montelukast 10 mg daily-take at bedtime  - stop Flovent for now  - step-up to Symbicort 2 puffs twice a day at this time.    - once you come back from your trip if  breathing symptoms are all resolved and meeting the below goals then can drop back down to flovent 2 puffs twice a day  Control goals:  Full participation in all desired activities (may need albuterol before activity) Albuterol use two time or less a week on average (not counting use with activity) Cough interfering with sleep two time or less a month Oral steroids no more than once a year No hospitalizations  Allergies  - continue avoidance measures for grass pollens, tree pollens, mold, dust mites, cat, dog, cockroach  - for allergy symptom relief recommend use of long-acting antihistamine like Allegra 180 mg, Zyrtec 10 mg or Xyzal 5 mg as needed  - for watery eyes Pataday 1 drop each eye daily as  needed  - let us know if nasal drainage returns.  You can use over-the-counter Astepro 2 sprays each nostril 1-2 times a day.    Reflux  - continue daily omeprazole for control of reflux  Follow-up 4-6 months or sooner if needed  I appreciate the opportunity to take part in Jayliah's care. Please do not hesitate to contact me with questions.  Sincerely,   Margo Aye, MD Allergy/Immunology [facility]

## 2022-04-18 NOTE — Patient Instructions (Addendum)
Asthmatic bronchitis   - lung function testing is lower today and reflective of recent respiratory illness  - have access to albuterol inhaler 2 puffs every 4-6 hours as needed for cough/wheeze/shortness of breath/chest tightness.  May use 15-20 minutes prior to activity.   Monitor frequency of use.    - continue montelukast 10 mg daily-take at bedtime  - stop Flovent for now  - step-up to Symbicort 2 puffs twice a day at this time.    - once you come back from your trip if breathing symptoms are all resolved and meeting the below goals then can drop back down to flovent 2 puffs twice a day  Control goals:  Full participation in all desired activities (may need albuterol before activity) Albuterol use two time or less a week on average (not counting use with activity) Cough interfering with sleep two time or less a month Oral steroids no more than once a year No hospitalizations  Allergies  - continue avoidance measures for grass pollens, tree pollens, mold, dust mites, cat, dog, cockroach  - for allergy symptom relief recommend use of long-acting antihistamine like Allegra 180 mg, Zyrtec 10 mg or Xyzal 5 mg as needed  - for watery eyes Pataday 1 drop each eye daily as needed  - let us know if nasal drainage returns.  You can use over-the-counter Astepro 2 sprays each nostril 1-2 times a day.    Reflux  - continue daily omeprazole for control of reflux   Follow-up 4-6 months or sooner if needed

## 2022-04-25 DIAGNOSIS — G44309 Post-traumatic headache, unspecified, not intractable: Secondary | ICD-10-CM | POA: Diagnosis not present

## 2022-05-14 DIAGNOSIS — U071 COVID-19: Secondary | ICD-10-CM | POA: Diagnosis not present

## 2022-05-14 DIAGNOSIS — J4530 Mild persistent asthma, uncomplicated: Secondary | ICD-10-CM | POA: Diagnosis not present

## 2022-05-24 DIAGNOSIS — J4530 Mild persistent asthma, uncomplicated: Secondary | ICD-10-CM | POA: Diagnosis not present

## 2022-05-24 DIAGNOSIS — Z6822 Body mass index (BMI) 22.0-22.9, adult: Secondary | ICD-10-CM | POA: Diagnosis not present

## 2022-05-24 DIAGNOSIS — G44309 Post-traumatic headache, unspecified, not intractable: Secondary | ICD-10-CM | POA: Diagnosis not present

## 2022-05-24 DIAGNOSIS — Z23 Encounter for immunization: Secondary | ICD-10-CM | POA: Diagnosis not present

## 2022-06-04 DIAGNOSIS — M810 Age-related osteoporosis without current pathological fracture: Secondary | ICD-10-CM | POA: Diagnosis not present

## 2022-06-07 DIAGNOSIS — Z1231 Encounter for screening mammogram for malignant neoplasm of breast: Secondary | ICD-10-CM | POA: Diagnosis not present

## 2022-06-10 DIAGNOSIS — N3281 Overactive bladder: Secondary | ICD-10-CM | POA: Diagnosis not present

## 2022-06-10 DIAGNOSIS — N3941 Urge incontinence: Secondary | ICD-10-CM | POA: Diagnosis not present

## 2022-06-10 DIAGNOSIS — Z818 Family history of other mental and behavioral disorders: Secondary | ICD-10-CM | POA: Diagnosis not present

## 2022-06-10 DIAGNOSIS — R350 Frequency of micturition: Secondary | ICD-10-CM | POA: Diagnosis not present

## 2022-06-10 DIAGNOSIS — R351 Nocturia: Secondary | ICD-10-CM | POA: Diagnosis not present

## 2022-07-03 ENCOUNTER — Encounter: Payer: Self-pay | Admitting: Gastroenterology

## 2022-07-18 ENCOUNTER — Other Ambulatory Visit: Payer: Self-pay

## 2022-07-18 ENCOUNTER — Ambulatory Visit: Payer: Medicare PPO | Admitting: Allergy

## 2022-07-18 ENCOUNTER — Encounter: Payer: Self-pay | Admitting: Allergy

## 2022-07-18 VITALS — BP 112/78 | HR 65 | Temp 97.2°F | Resp 16 | Ht 60.5 in | Wt 128.8 lb

## 2022-07-18 DIAGNOSIS — J301 Allergic rhinitis due to pollen: Secondary | ICD-10-CM

## 2022-07-18 DIAGNOSIS — J4540 Moderate persistent asthma, uncomplicated: Secondary | ICD-10-CM

## 2022-07-18 DIAGNOSIS — H1013 Acute atopic conjunctivitis, bilateral: Secondary | ICD-10-CM

## 2022-07-18 DIAGNOSIS — K219 Gastro-esophageal reflux disease without esophagitis: Secondary | ICD-10-CM

## 2022-07-18 MED ORDER — MONTELUKAST SODIUM 10 MG PO TABS: 10.0000 mg | ORAL_TABLET | Freq: Every day | ORAL | 5 refills | Status: DC

## 2022-07-18 MED ORDER — OMEPRAZOLE 40 MG PO CPDR
DELAYED_RELEASE_CAPSULE | ORAL | 2 refills | Status: DC
Start: 2022-07-18 — End: 2022-11-20

## 2022-07-18 MED ORDER — ALBUTEROL SULFATE HFA 108 (90 BASE) MCG/ACT IN AERS: 2.0000 | INHALATION_SPRAY | Freq: Four times a day (QID) | RESPIRATORY_TRACT | 1 refills | Status: DC | PRN

## 2022-07-18 NOTE — Patient Instructions (Addendum)
Asthmatic bronchitis   - doing well currently!  - have access to albuterol inhaler 2 puffs every 4-6 hours as needed for cough/wheeze/shortness of breath/chest tightness.  May use 15-20 minutes prior to activity.   Monitor frequency of use.    - continue montelukast 10 mg daily-take at bedtime  - for now will continue Symbicort 1-2 puffs twice a day at this time.   Can use Symbicort as a rescue as well and can use up to 10 puffs/day of Symbicort  Control goals:  Full participation in all desired activities (may need albuterol before activity) Albuterol use two time or less a week on average (not counting use with activity) Cough interfering with sleep two time or less a month Oral steroids no more than once a year No hospitalizations  Allergies  - continue avoidance measures for grass pollens, tree pollens, mold, dust mites, cat, dog, cockroach  - for allergy symptom relief recommend use of long-acting antihistamine like Allegra 180 mg, Zyrtec 10 mg or Xyzal 5 mg as needed  - for watery eyes Pataday 1 drop each eye daily as needed  - for nasal drainage/runny nose you can get over-the-counter Astepro 2 sprays each nostril 1-2 times a day.    Reflux  - continue daily omeprazole for control of reflux   Follow-up 4 months or sooner if needed

## 2022-07-18 NOTE — Progress Notes (Signed)
Follow-up Note  RE: Tiffany Reyes MRN: [id number] DOB: June 20, 1951 Date of Office Visit: 07/18/2022   History of present illness: Tiffany Reyes is a 71 y.o. female presenting today for follow-up of asthmatic bronchitis, allergic rhinitis with conjunctivitis and reflux.  She was last seen in the office on 04/18/22 by myself.  After last visit she went on her [place] trip in October. She did get Covid at the end of the trip.  She states she had mild fever and extreme fatigue in bouts.  She states her asthma was fine with this illness.   Otherwise she states she is doing great.  She states she has a bit of a drippy nose but if she stays in the house then her nose doesn't run.  At last visit recommended she step-up therapy to Symbicort 2 puffs twice a day as at the time she had a respiratory illness and had her upcoming trip.  She does feel like the symbicort has been very helpful for her breathing control.  She continues to take singulair daily as well.  She has not had any need for systemic steroid since last visit.   She will use an antihistamine as needed like allegra, zyrtec or xyzal.  She will use pataday as needed for itchy/watery eyes.  She continues to take omeprazole daily for reflux control.    Review of systems: Review of Systems  Constitutional: Negative.   HENT:  Positive for postnasal drip.   Eyes: Negative.   Respiratory: Negative.    Cardiovascular: Negative.   Gastrointestinal: Negative.   Musculoskeletal: Negative.   Skin: Negative.   Allergic/Immunologic: Negative.   Neurological: Negative.      All other systems negative unless noted above in HPI  Past medical/social/surgical/family history have been reviewed and are unchanged unless specifically indicated below.  No changes  Medication List: Current Outpatient Medications  Medication Sig Dispense Refill   acetaminophen (TYLENOL) 500 MG tablet Take 500 mg by mouth as needed.     albuterol (VENTOLIN HFA) 108  (90 Base) MCG/ACT inhaler Inhale 2 puffs into the lungs every 6 (six) hours as needed for wheezing or shortness of breath. 18 g 1   atorvastatin (LIPITOR) 20 MG tablet 20 mg.     budesonide-formoterol (SYMBICORT) 160-4.5 MCG/ACT inhaler Inhale 2 puffs into the lungs 2 (two) times daily. 1 each 5   calcium carbonate (TUMS EX) 750 MG chewable tablet Chew 1 tablet by mouth as needed for heartburn.     Cholecalciferol (VITAMIN D3) 50 MCG (2000 UT) capsule 1 capsule     diphenhydrAMINE (BENADRYL) 50 MG/ML injection Inject into the vein.     escitalopram (LEXAPRO) 10 MG tablet 0.5 tablets (5 mg total).     escitalopram (LEXAPRO) 5 MG tablet Take 10 mg by mouth daily.     fluticasone (FLOVENT HFA) 110 MCG/ACT inhaler Inhale 2 puffs into the lungs 2 (two) times daily. 1 each 5   LAGEVRIO 200 MG CAPS capsule SMARTSIG:4 Capsule(s) By Mouth Every 12 Hours     metroNIDAZOLE (METROCREAM) 0.75 % cream Apply topically.     montelukast (SINGULAIR) 10 MG tablet TAKE ONE TABLET AT BEDTIME. 30 tablet 5   NON FORMULARY Take 2 capsules by mouth in the morning and at bedtime. HYDROEYE     omeprazole (PRILOSEC) 40 MG capsule Take one capsule three times a week if symptoms persist then can go back to daily 90 capsule 3   rosuvastatin (CRESTOR) 20 MG tablet Take 40  mg by mouth at bedtime.     atorvastatin (LIPITOR) 80 MG tablet 0.5 tablets (40 mg total).     Cholecalciferol (VITAMIN D-1000 MAX ST) 25 MCG (1000 UT) tablet Take by mouth.     Cyanocobalamin 2500 MCG CHEW Chew by mouth.     metroNIDAZOLE (METROCREAM) 0.75 % cream Apply topically every morning. (Patient not taking: Reported on 07/18/2022)     MYRBETRIQ 25 MG TB24 tablet Take 25 mg by mouth daily.     Olopatadine HCl (PATADAY) 0.2 % SOLN Place 1 drop into both eyes daily as needed (Itchy/watery eyes). 2.5 mL 5   rosuvastatin (CRESTOR) 20 MG tablet Take 1 tablet by mouth daily.     Zinc 50 MG TABS Take 50 mg by mouth daily.     No current  facility-administered medications for this visit.     Known medication allergies: Allergies  Allergen Reactions   Ambien [Zolpidem]    Alendronate Other (See Comments)   Codeine Nausea Only   Eszopiclone Other (See Comments)   Fluconazole Hives    Veins stood out   Moxifloxacin Hcl Other (See Comments)    Headache, bodyache   Sulfa Antibiotics Other (See Comments)    Unknown reaction   Tetanus Toxoids Swelling    August 1976 last tetanus expierenced intense fever. Swelling in arm.and Neck    Trazodone Other (See Comments)    Hangover   Excedrin Pm [Diphenhydramine-Apap (Sleep)]    Penicillins Rash    Has patient had a PCN reaction causing immediate rash, facial/tongue/throat swelling, SOB or lightheadedness with hypotension: Yes Has patient had a PCN reaction causing severe rash involving mucus membranes or skin necrosis: No Has patient had a PCN reaction that required hospitalization No Has patient had a PCN reaction occurring within the last 10 years: No If all of the above answers are "NO", then may proceed with Cephalosporin use.      Physical examination: Blood pressure 112/78, pulse 65, temperature (!) 97.2 F (36.2 C), temperature source Temporal, resp. rate 16, height 5' 0.5" (1.537 m), weight 128 lb 12.8 oz (58.4 kg), SpO2 98 %.  General: Alert, interactive, in no acute distress. HEENT: PERRLA, TMs pearly gray, turbinates non-edematous without discharge, post-pharynx non erythematous. Neck: Supple without lymphadenopathy. Lungs: Clear to auscultation without wheezing, rhonchi or rales. {no increased work of breathing. CV: Normal S1, S2 without murmurs. Abdomen: Nondistended, nontender. Skin: Warm and dry, without lesions or rashes. Extremities:  No clubbing, cyanosis or edema. Neuro:   Grossly intact.  Diagnositics/Labs: None today   Assessment and plan:   Asthmatic bronchitis   - doing well currently!  - have access to albuterol inhaler 2 puffs every 4-6  hours as needed for cough/wheeze/shortness of breath/chest tightness.  May use 15-20 minutes prior to activity.   Monitor frequency of use.    - continue montelukast 10 mg daily-take at bedtime  - for now will continue Symbicort 1-2 puffs twice a day at this time.   Can use Symbicort as a rescue as well and can use up to 10 puffs/day of Symbicort  Control goals:  Full participation in all desired activities (may need albuterol before activity) Albuterol use two time or less a week on average (not counting use with activity) Cough interfering with sleep two time or less a month Oral steroids no more than once a year No hospitalizations  Allergic rhinitis with conjunctivitis  - continue avoidance measures for grass pollens, tree pollens, mold, dust mites, cat, dog,  cockroach  - for allergy symptom relief recommend use of long-acting antihistamine like Allegra 180 mg, Zyrtec 10 mg or Xyzal 5 mg as needed  - for watery eyes Pataday 1 drop each eye daily as needed  - for nasal drainage/runny nose you can get over-the-counter Astepro 2 sprays each nostril 1-2 times a day.    Reflux  - continue daily omeprazole for control of reflux  Follow-up 4 months or sooner if needed  I appreciate the opportunity to take part in Toni's care. Please do not hesitate to contact me with questions.  Sincerely,   Margo Aye, MD Allergy/Immunology [facility]

## 2022-07-21 DIAGNOSIS — H1131 Conjunctival hemorrhage, right eye: Secondary | ICD-10-CM | POA: Diagnosis not present

## 2022-09-12 DIAGNOSIS — Z9181 History of falling: Secondary | ICD-10-CM | POA: Diagnosis not present

## 2022-09-12 DIAGNOSIS — Z1389 Encounter for screening for other disorder: Secondary | ICD-10-CM | POA: Diagnosis not present

## 2022-09-12 DIAGNOSIS — Z0000 Encounter for general adult medical examination without abnormal findings: Secondary | ICD-10-CM | POA: Diagnosis not present

## 2022-09-12 DIAGNOSIS — E7800 Pure hypercholesterolemia, unspecified: Secondary | ICD-10-CM | POA: Diagnosis not present

## 2022-09-12 DIAGNOSIS — Z6822 Body mass index (BMI) 22.0-22.9, adult: Secondary | ICD-10-CM | POA: Diagnosis not present

## 2022-09-12 DIAGNOSIS — E538 Deficiency of other specified B group vitamins: Secondary | ICD-10-CM | POA: Diagnosis not present

## 2022-09-12 DIAGNOSIS — R7303 Prediabetes: Secondary | ICD-10-CM | POA: Diagnosis not present

## 2022-09-12 DIAGNOSIS — M816 Localized osteoporosis [Lequesne]: Secondary | ICD-10-CM | POA: Diagnosis not present

## 2022-09-12 DIAGNOSIS — R7309 Other abnormal glucose: Secondary | ICD-10-CM | POA: Diagnosis not present

## 2022-09-16 DIAGNOSIS — R39198 Other difficulties with micturition: Secondary | ICD-10-CM | POA: Diagnosis not present

## 2022-09-16 DIAGNOSIS — R319 Hematuria, unspecified: Secondary | ICD-10-CM | POA: Diagnosis not present

## 2022-09-18 ENCOUNTER — Ambulatory Visit: Payer: Medicare PPO | Admitting: Allergy

## 2022-09-18 DIAGNOSIS — D123 Benign neoplasm of transverse colon: Secondary | ICD-10-CM | POA: Diagnosis not present

## 2022-09-18 DIAGNOSIS — Z86010 Personal history of colonic polyps: Secondary | ICD-10-CM | POA: Diagnosis not present

## 2022-09-18 DIAGNOSIS — Z09 Encounter for follow-up examination after completed treatment for conditions other than malignant neoplasm: Secondary | ICD-10-CM | POA: Diagnosis not present

## 2022-09-18 DIAGNOSIS — K648 Other hemorrhoids: Secondary | ICD-10-CM | POA: Diagnosis not present

## 2022-09-20 DIAGNOSIS — D123 Benign neoplasm of transverse colon: Secondary | ICD-10-CM | POA: Diagnosis not present

## 2022-09-25 DIAGNOSIS — S060X0S Concussion without loss of consciousness, sequela: Secondary | ICD-10-CM | POA: Diagnosis not present

## 2022-09-25 DIAGNOSIS — I2510 Atherosclerotic heart disease of native coronary artery without angina pectoris: Secondary | ICD-10-CM | POA: Diagnosis not present

## 2022-09-25 DIAGNOSIS — R7303 Prediabetes: Secondary | ICD-10-CM | POA: Diagnosis not present

## 2022-09-25 DIAGNOSIS — E7800 Pure hypercholesterolemia, unspecified: Secondary | ICD-10-CM | POA: Diagnosis not present

## 2022-09-25 DIAGNOSIS — F411 Generalized anxiety disorder: Secondary | ICD-10-CM | POA: Diagnosis not present

## 2022-09-25 DIAGNOSIS — G44309 Post-traumatic headache, unspecified, not intractable: Secondary | ICD-10-CM | POA: Diagnosis not present

## 2022-09-25 DIAGNOSIS — Z23 Encounter for immunization: Secondary | ICD-10-CM | POA: Diagnosis not present

## 2022-09-25 DIAGNOSIS — Z01419 Encounter for gynecological examination (general) (routine) without abnormal findings: Secondary | ICD-10-CM | POA: Diagnosis not present

## 2022-09-25 DIAGNOSIS — J4530 Mild persistent asthma, uncomplicated: Secondary | ICD-10-CM | POA: Diagnosis not present

## 2022-10-10 DIAGNOSIS — L723 Sebaceous cyst: Secondary | ICD-10-CM | POA: Diagnosis not present

## 2022-10-10 DIAGNOSIS — D225 Melanocytic nevi of trunk: Secondary | ICD-10-CM | POA: Diagnosis not present

## 2022-10-10 DIAGNOSIS — L814 Other melanin hyperpigmentation: Secondary | ICD-10-CM | POA: Diagnosis not present

## 2022-10-10 DIAGNOSIS — L821 Other seborrheic keratosis: Secondary | ICD-10-CM | POA: Diagnosis not present

## 2022-10-10 DIAGNOSIS — L80 Vitiligo: Secondary | ICD-10-CM | POA: Diagnosis not present

## 2022-10-11 DIAGNOSIS — M810 Age-related osteoporosis without current pathological fracture: Secondary | ICD-10-CM | POA: Diagnosis not present

## 2022-10-11 DIAGNOSIS — Z780 Asymptomatic menopausal state: Secondary | ICD-10-CM | POA: Diagnosis not present

## 2022-10-14 DIAGNOSIS — N3941 Urge incontinence: Secondary | ICD-10-CM | POA: Diagnosis not present

## 2022-10-14 DIAGNOSIS — N952 Postmenopausal atrophic vaginitis: Secondary | ICD-10-CM | POA: Diagnosis not present

## 2022-10-14 DIAGNOSIS — N3281 Overactive bladder: Secondary | ICD-10-CM | POA: Diagnosis not present

## 2022-10-14 DIAGNOSIS — R350 Frequency of micturition: Secondary | ICD-10-CM | POA: Diagnosis not present

## 2022-10-14 DIAGNOSIS — R351 Nocturia: Secondary | ICD-10-CM | POA: Diagnosis not present

## 2022-10-16 DIAGNOSIS — M1612 Unilateral primary osteoarthritis, left hip: Secondary | ICD-10-CM | POA: Diagnosis not present

## 2022-10-24 DIAGNOSIS — M1612 Unilateral primary osteoarthritis, left hip: Secondary | ICD-10-CM | POA: Diagnosis not present

## 2022-11-13 DIAGNOSIS — M1612 Unilateral primary osteoarthritis, left hip: Secondary | ICD-10-CM | POA: Diagnosis not present

## 2022-11-20 ENCOUNTER — Encounter: Payer: Self-pay | Admitting: Allergy

## 2022-11-20 ENCOUNTER — Other Ambulatory Visit: Payer: Self-pay

## 2022-11-20 ENCOUNTER — Ambulatory Visit: Payer: Medicare PPO | Admitting: Allergy

## 2022-11-20 VITALS — BP 110/78 | HR 73 | Temp 98.2°F | Resp 20 | Ht 62.0 in | Wt 125.0 lb

## 2022-11-20 DIAGNOSIS — K219 Gastro-esophageal reflux disease without esophagitis: Secondary | ICD-10-CM

## 2022-11-20 DIAGNOSIS — H1013 Acute atopic conjunctivitis, bilateral: Secondary | ICD-10-CM

## 2022-11-20 DIAGNOSIS — J4540 Moderate persistent asthma, uncomplicated: Secondary | ICD-10-CM | POA: Diagnosis not present

## 2022-11-20 DIAGNOSIS — J301 Allergic rhinitis due to pollen: Secondary | ICD-10-CM

## 2022-11-20 MED ORDER — FLUTICASONE PROPIONATE HFA 110 MCG/ACT IN AERO
2.0000 | INHALATION_SPRAY | Freq: Two times a day (BID) | RESPIRATORY_TRACT | 5 refills | Status: DC
Start: 2022-11-20 — End: 2023-06-05

## 2022-11-20 MED ORDER — OMEPRAZOLE 40 MG PO CPDR: DELAYED_RELEASE_CAPSULE | ORAL | 2 refills | Status: AC

## 2022-11-20 MED ORDER — MONTELUKAST SODIUM 10 MG PO TABS: 10.0000 mg | ORAL_TABLET | Freq: Every day | ORAL | 5 refills | Status: DC

## 2022-11-20 MED ORDER — BUDESONIDE-FORMOTEROL FUMARATE 160-4.5 MCG/ACT IN AERO
2.0000 | INHALATION_SPRAY | Freq: Two times a day (BID) | RESPIRATORY_TRACT | 5 refills | Status: DC
Start: 2022-11-20 — End: 2023-06-05

## 2022-11-20 NOTE — Progress Notes (Signed)
Follow-up Note  RE: Tiffany Patterhyllis J Gemma MRN: [id number] DOB: 1951-04-19 Date of Office Visit: 11/20/2022   History of present illness: Tiffany Reyes is a 72 y.o. female presenting today for follow-up of asthmatic bronchitis, allergic rhinitis with conjunctivitis and reflux.  She was last seen in the office on 07/18/2022 by myself.  She has not had any major health changes, surgeries or hospitalizations since this visit.  This year she states she is having more issues with the pollen.  She cleaned off her deck which took a while recently.  Since doing this she has had improved nasal symptoms now that the pollen isn't sitting on her deck and building up potentially walking into her home.  Does not wear a mask with doing this activity.  She does continue to take an antihistamine as needed and has been recommended to use Pataday as needed as well as Astepro for drainage control as needed. She has noted some mild chest tightness 2-3 weeks ago.  She states she did not have any trouble doing activities.  Intermittently need to use her rescue inhaler however she has used the Symbicort as rescue at least 1-2 times this year.  She does use her Flovent as her maintenance 2 puffs mostly in the mornings.  She overall is doing quite well in regards to her asthma control.  She has not needed any systemic steroids. For reflux control she does use omeprazole. She states she is trying to become more active and her trainer is wanting her to do more outdoor activity like walking hills which she has in her neighborhood.  Review of systems: Review of Systems  Constitutional: Negative.   HENT: Negative.    Eyes: Negative.   Respiratory:  Positive for chest tightness.   Cardiovascular: Negative.   Gastrointestinal: Negative.   Musculoskeletal: Negative.   Skin: Negative.   Allergic/Immunologic: Negative.   Neurological: Negative.      All other systems negative unless noted above in HPI  Past  medical/social/surgical/family history have been reviewed and are unchanged unless specifically indicated below.  No changes  Medication List: Current Outpatient Medications  Medication Sig Dispense Refill   acetaminophen (TYLENOL) 500 MG tablet Take 500 mg by mouth as needed.     albuterol (VENTOLIN HFA) 108 (90 Base) MCG/ACT inhaler Inhale 2 puffs into the lungs every 6 (six) hours as needed for wheezing or shortness of breath. 18 g 1   atorvastatin (LIPITOR) 20 MG tablet 20 mg.     calcium carbonate (TUMS EX) 750 MG chewable tablet Chew 1 tablet by mouth as needed for heartburn.     Cholecalciferol (VITAMIN D-1000 MAX ST) 25 MCG (1000 UT) tablet Take by mouth.     Cholecalciferol (VITAMIN D3) 50 MCG (2000 UT) capsule 1 capsule     Cyanocobalamin 2500 MCG CHEW Chew by mouth.     diphenhydrAMINE (BENADRYL) 50 MG/ML injection Inject into the vein.     escitalopram (LEXAPRO) 10 MG tablet 0.5 tablets (5 mg total).     escitalopram (LEXAPRO) 5 MG tablet Take 10 mg by mouth daily.     metroNIDAZOLE (METROCREAM) 0.75 % cream Apply topically.     MYRBETRIQ 25 MG TB24 tablet Take 25 mg by mouth daily.     NON FORMULARY Take 2 capsules by mouth in the morning and at bedtime. HYDROEYE     Olopatadine HCl (PATADAY) 0.2 % SOLN Place 1 drop into both eyes daily as needed (Itchy/watery eyes). 2.5 mL 5  rosuvastatin (CRESTOR) 20 MG tablet Take 40 mg by mouth at bedtime.     rosuvastatin (CRESTOR) 20 MG tablet Take 1 tablet by mouth daily.     atorvastatin (LIPITOR) 80 MG tablet 0.5 tablets (40 mg total). (Patient not taking: Reported on 11/20/2022)     budesonide-formoterol (SYMBICORT) 160-4.5 MCG/ACT inhaler Inhale 2 puffs into the lungs 2 (two) times daily. 1 each 5   fluticasone (FLOVENT HFA) 110 MCG/ACT inhaler Inhale 2 puffs into the lungs 2 (two) times daily. 1 each 5   montelukast (SINGULAIR) 10 MG tablet Take 1 tablet (10 mg total) by mouth at bedtime. 30 tablet 5   omeprazole (PRILOSEC) 40 MG  capsule Take one capsule three times a week if symptoms persist then can go back to daily 90 capsule 2   No current facility-administered medications for this visit.     Known medication allergies: Allergies  Allergen Reactions   Ambien [Zolpidem]    Alendronate Other (See Comments)   Codeine Nausea Only   Eszopiclone Other (See Comments)   Fluconazole Hives    Veins stood out   Moxifloxacin Hcl Other (See Comments)    Headache, bodyache   Sulfa Antibiotics Other (See Comments)    Unknown reaction   Tetanus Toxoids Swelling    August 1976 last tetanus expierenced intense fever. Swelling in arm.and Neck    Trazodone Other (See Comments)    Hangover   Excedrin Pm [Diphenhydramine-Apap (Sleep)]    Penicillins Rash    Has patient had a PCN reaction causing immediate rash, facial/tongue/throat swelling, SOB or lightheadedness with hypotension: Yes Has patient had a PCN reaction causing severe rash involving mucus membranes or skin necrosis: No Has patient had a PCN reaction that required hospitalization No Has patient had a PCN reaction occurring within the last 10 years: No If all of the above answers are "NO", then may proceed with Cephalosporin use.      Physical examination: Blood pressure 110/78, pulse 73, temperature 98.2 F (36.8 C), resp. rate 20, height 5\' 2"  (1.575 m), weight 125 lb (56.7 kg), SpO2 97 %.  General: Alert, interactive, in no acute distress. HEENT: PERRLA, TMs pearly gray, turbinates non-edematous without discharge, post-pharynx non erythematous. Neck: Supple without lymphadenopathy. Lungs: Clear to auscultation without wheezing, rhonchi or rales. {no increased work of breathing. CV: Normal S1, S2 without murmurs. Abdomen: Nondistended, nontender. Skin: Warm and dry, without lesions or rashes. Extremities:  No clubbing, cyanosis or edema. Neuro:   Grossly intact.  Diagnositics/Labs:  Spirometry: FEV1: 1.77L 92%, FVC: 2.32L 94%, ratio consistent with  nonobstructive pattern  Assessment and plan:   Asthmatic bronchitis   - doing well currently!  - lung function today looks fantastic with 92% FEV1  - have access to albuterol inhaler 2 puffs every 4-6 hours as needed for cough/wheeze/shortness of breath/chest tightness.  May use 15-20 minutes prior to activity.   Monitor frequency of use.    - continue montelukast 10 mg daily-take at bedtime  - continue Flovent 2 puffs in AM and if needed twice a day use  - can use Symbicort as a rescue as well and can use up to 10 puffs/day of Symbicort  Control goals:  Full participation in all desired activities (may need albuterol before activity) Albuterol use two time or less a week on average (not counting use with activity) Cough interfering with sleep two time or less a month Oral steroids no more than once a year No hospitalizations  Allergies  - continue  avoidance measures for grass pollens, tree pollens, mold, dust mites, cat, dog, cockroach  - for allergy symptom relief recommend use of long-acting antihistamine like Allegra 180 mg, Zyrtec 10 mg or Xyzal 5 mg as needed  - for watery eyes Pataday 1 drop each eye daily as needed  - for nasal drainage/runny nose you can get over-the-counter Astepro 2 sprays each nostril 1-2 times a day.    Reflux  - continue daily omeprazole for control of reflux   Follow-up 6 months or sooner if needed  I appreciate the opportunity to take part in Tiffany Reyes's care. Please do not hesitate to contact me with questions.  Sincerely,   Margo Aye, MD Allergy/Immunology [facility]

## 2022-11-20 NOTE — Patient Instructions (Addendum)
Asthmatic bronchitis   - doing well currently!  - lung function today looks fantastic with 92% function  - have access to albuterol inhaler 2 puffs every 4-6 hours as needed for cough/wheeze/shortness of breath/chest tightness.  May use 15-20 minutes prior to activity.   Monitor frequency of use.    - continue montelukast 10 mg daily-take at bedtime  - continue Flovent 2 puffs in AM and if needed twice a day use  - can use Symbicort as a rescue as well and can use up to 10 puffs/day of Symbicort  Control goals:  Full participation in all desired activities (may need albuterol before activity) Albuterol use two time or less a week on average (not counting use with activity) Cough interfering with sleep two time or less a month Oral steroids no more than once a year No hospitalizations  Allergies  - continue avoidance measures for grass pollens, tree pollens, mold, dust mites, cat, dog, cockroach  - for allergy symptom relief recommend use of long-acting antihistamine like Allegra 180 mg, Zyrtec 10 mg or Xyzal 5 mg as needed  - for watery eyes Pataday 1 drop each eye daily as needed  - for nasal drainage/runny nose you can get over-the-counter Astepro 2 sprays each nostril 1-2 times a day.    Reflux  - continue daily omeprazole for control of reflux   Follow-up 6 months or sooner if needed

## 2022-12-26 ENCOUNTER — Telehealth: Payer: Self-pay

## 2022-12-26 NOTE — Telephone Encounter (Signed)
Received fax from [facility]  - Pre-Operative Risk Assessment Form for Left total hip arthroplasty for patient.  Form has been placed in provider's in basket to review/complete/sign.  Message has been forwarded to provider  a update.

## 2023-01-01 DIAGNOSIS — R0789 Other chest pain: Secondary | ICD-10-CM | POA: Diagnosis not present

## 2023-01-01 DIAGNOSIS — K219 Gastro-esophageal reflux disease without esophagitis: Secondary | ICD-10-CM | POA: Diagnosis not present

## 2023-01-01 DIAGNOSIS — I2584 Coronary atherosclerosis due to calcified coronary lesion: Secondary | ICD-10-CM | POA: Diagnosis not present

## 2023-01-01 DIAGNOSIS — Z6822 Body mass index (BMI) 22.0-22.9, adult: Secondary | ICD-10-CM | POA: Diagnosis not present

## 2023-01-02 NOTE — Telephone Encounter (Signed)
Per provider: Completed and given back to [facility] faxed to Dewaine Conger on yesterday, 01/01/23.

## 2023-01-08 ENCOUNTER — Encounter (HOSPITAL_COMMUNITY): Payer: Self-pay

## 2023-01-08 DIAGNOSIS — M1612 Unilateral primary osteoarthritis, left hip: Secondary | ICD-10-CM | POA: Diagnosis not present

## 2023-01-10 DIAGNOSIS — M810 Age-related osteoporosis without current pathological fracture: Secondary | ICD-10-CM | POA: Diagnosis not present

## 2023-01-14 NOTE — H&P (Signed)
HIP ARTHROPLASTY ADMISSION H&P  Patient ID: Tiffany Reyes MRN: [id number] DOB/AGE: 11-21-1950 72 y.o.  Chief Complaint: left hip pain.  Planned Procedure Date: 02/04/23 Medical and Cardiac Clearance by Dr. Gweneth Dimitri   Pulmonary clearance by Dr. Delorse Lek   HPI: Tiffany Reyes is a 72 y.o. female who presents for evaluation of OA LEFT HIP. The patient has a history of pain and functional disability in the left hip due to arthritis and has failed non-surgical conservative treatments for greater than 12 weeks to include NSAID's and/or analgesics, corticosteriod injections, supervised PT with diminished ADL's post treatment, use of assistive devices, and activity modification.  Onset of symptoms was gradual, starting 2 years ago with gradually worsening course since that time. The patient noted no past surgery on the left hip.  Patient currently rates pain at 6 out of 10 with activity. Patient has night pain, worsening of pain with activity and weight bearing, and pain that interferes with activities of daily living.  Patient has evidence of subchondral sclerosis, periarticular osteophytes, and joint space narrowing by imaging studies.  There is no active infection.  Past Medical History:  Diagnosis Date   Adjustment disorder    Allergy    Anemia    long time ago per pt.   Arthritis    Asthma    Bronchitis    Cataract    bilateral   GERD (gastroesophageal reflux disease)    Hemochromatosis    Carrier only   Hyperlipidemia    Hyperparathyroidism (HCC) 09/2016   With hypercalcemia   Lumbar radiculopathy, chronic    Osteopenia    Osteoporosis    Recurrent UTI    Vitamin D deficiency    Past Surgical History:  Procedure Laterality Date   BREAST BIOPSY Left    BREAST LUMPECTOMY Left 11/1992, 11/1994   COLONOSCOPY     NM MYOVIEW LTD  02/2016    EF >65%.  Up-sloping ST depression in Inferolateral leads (was initially called + GXT) --> No Ischemia or Infarction..  Normal BP response  to exercise.   PARATHYROIDECTOMY  04/2019   POLYPECTOMY     TRANSTHORACIC ECHOCARDIOGRAM  01/2016   EF 55-60%.  GR 1 DD.  No RWMA.  No significant valvular lesions.   WRIST FRACTURE SURGERY Right 06/2020   Allergies  Allergen Reactions   Ambien [Zolpidem]    Alendronate Other (See Comments)   Codeine Nausea Only   Eszopiclone Other (See Comments)   Fluconazole Hives    Veins stood out   Moxifloxacin Hcl Other (See Comments)    Headache, bodyache   Sulfa Antibiotics Other (See Comments)    Unknown reaction   Tetanus Toxoids Swelling    August 1976 last tetanus expierenced intense fever. Swelling in arm.and Neck    Trazodone Other (See Comments)    Hangover   Excedrin Pm [Diphenhydramine-Apap (Sleep)]    Penicillins Rash    Has patient had a PCN reaction causing immediate rash, facial/tongue/throat swelling, SOB or lightheadedness with hypotension: Yes Has patient had a PCN reaction causing severe rash involving mucus membranes or skin necrosis: No Has patient had a PCN reaction that required hospitalization No Has patient had a PCN reaction occurring within the last 10 years: No If all of the above answers are "NO", then may proceed with Cephalosporin use.    Prior to Admission medications   Medication Sig Start Date End Date Taking? Authorizing Provider  acetaminophen (TYLENOL) 500 MG tablet Take 500 mg by mouth as needed.  [provider]  albuterol (VENTOLIN HFA) 108 (90 Base) MCG/ACT inhaler Inhale 2 puffs into the lungs every 6 (six) hours as needed for wheezing or shortness of breath. 07/18/22   Marcelyn Bruins, MD  atorvastatin (LIPITOR) 20 MG tablet 20 mg. 09/13/20   [provider]  atorvastatin (LIPITOR) 80 MG tablet 0.5 tablets (40 mg total). Patient not taking: Reported on 11/20/2022    [provider]  budesonide-formoterol (SYMBICORT) 160-4.5 MCG/ACT inhaler Inhale 2 puffs into the lungs 2 (two) times daily. 11/20/22   Marcelyn Bruins, MD  calcium carbonate (TUMS EX) 750 MG chewable tablet Chew 1 tablet by mouth as needed for heartburn.    [provider]  Cholecalciferol (VITAMIN D-1000 MAX ST) 25 MCG (1000 UT) tablet Take by mouth.    [provider]  Cholecalciferol (VITAMIN D3) 50 MCG (2000 UT) capsule 1 capsule 09/13/20   [provider]  Cyanocobalamin 2500 MCG CHEW Chew by mouth.    [provider]  diphenhydrAMINE (BENADRYL) 50 MG/ML injection Inject into the vein. 02/07/22   [provider]  escitalopram (LEXAPRO) 10 MG tablet 0.5 tablets (5 mg total). 05/24/20   [provider]  escitalopram (LEXAPRO) 5 MG tablet Take 10 mg by mouth daily.    [provider]  fluticasone (FLOVENT HFA) 110 MCG/ACT inhaler Inhale 2 puffs into the lungs 2 (two) times daily. 11/20/22   Marcelyn Bruins, MD  metroNIDAZOLE (METROCREAM) 0.75 % cream Apply topically. 01/06/15   [provider]  montelukast (SINGULAIR) 10 MG tablet Take 1 tablet (10 mg total) by mouth at bedtime. 11/20/22   Marcelyn Bruins, MD  MYRBETRIQ 25 MG TB24 tablet Take 25 mg by mouth daily.    [provider]  NON FORMULARY Take 2 capsules by mouth in the morning and at bedtime. HYDROEYE    [provider]  Olopatadine HCl (PATADAY) 0.2 % SOLN Place 1 drop into both eyes daily as needed (Itchy/watery eyes). 10/27/20   Marcelyn Bruins, MD  omeprazole (PRILOSEC) 40 MG capsule Take one capsule three times a week if symptoms persist then can go back to daily 11/20/22   Marcelyn Bruins, MD  rosuvastatin (CRESTOR) 20 MG tablet Take 40 mg by mouth at bedtime. 01/15/22   [provider]  rosuvastatin (CRESTOR) 20 MG tablet Take 1 tablet by mouth daily.    [provider]   Social History   Socioeconomic History   Marital status: Widowed    Spouse name: Not on file   Number of children: Not on file   Years of education:  Not on file   Highest education level: Not on file  Occupational History    Employer: RETIRED  Tobacco Use   Smoking status: Never   Smokeless tobacco: Never  Vaping Use   Vaping Use: Never used  Substance and Sexual Activity   Alcohol use: Yes    Alcohol/week: 1.0 standard drink of alcohol    Types: 1 Shots of liquor per week    Comment: 1 glass of wine or shot of whiskey a week.  Has cut down on this.   Drug use: No   Sexual activity: Not on file  Other Topics Concern   Not on file  Social History Narrative   Married.  The husband.  No children.   Retired Wellsite geologist.   Bachelor of Arts degree.   Has not exercised in the gym for over a year, prior to that  was very routine.      Formally very active without issues.   Husband Isac Sarna) - recently Dx w/ ? Lewy Body Dementia   Social Determinants of Health   Financial Resource Strain: Not on file  Food Insecurity: Not on file  Transportation Needs: Not on file  Physical Activity: Not on file  Stress: Not on file  Social Connections: Not on file   Family History  Problem Relation Age of Onset   CAD Mother 36       She had an MI. - CABG   Osteoporosis Mother    Stroke Mother 27       Multiple   Hyperlipidemia Mother    Kidney failure Father        Diet volume overloaded   CVA Father    Osteoporosis Father    Heart disease Father    Hyperlipidemia Sister    Colon polyps Sister    Hyperlipidemia Brother    Hypertension Brother    Diabetes Maternal Grandmother    Breast cancer Maternal Grandmother    Breast cancer Paternal Aunt    Alzheimer's disease Maternal Aunt    Colon cancer Neg Hx    Rectal cancer Neg Hx    Stomach cancer Neg Hx    Pancreatic cancer Neg Hx    Prostate cancer Neg Hx    Esophageal cancer Neg Hx    Allergic rhinitis Neg Hx    Angioedema Neg Hx    Asthma Neg Hx    Atopy Neg Hx    Eczema Neg Hx    Immunodeficiency Neg Hx    Urticaria Neg Hx     ROS: Currently denies lightheadedness,  dizziness, Fever, chills, CP, SOB.   No personal history of DVT, PE, MI, or CVA. No loose teeth or dentures All other systems have been reviewed and were otherwise currently negative with the exception of those mentioned in the HPI and as above.  Objective: Vitals: Ht: 5'2" Wt: 128 lbs Temp: 97.3 BP: 120/76 Pulse: 66 O2 97% on room air.   Physical Exam: General: Alert, NAD. Trendelenberg Gait  HEENT: EOMI, Good Neck Extension  Pulm: No increased work of breathing.  Clear B/L A/P w/o crackle or wheeze.  CV: RRR, No m/g/r appreciated  GI: soft, NT, ND. BS x 4 quadrants Neuro: CN II-XII grossly intact without focal deficit.  Sensation intact distally Skin: No lesions in the area of chief complaint MSK/Surgical Site: + TTP. Hip ROM decreased d/t pain. + Stinchfield. + SLR. + FABER/FADIR. Decreased strength.  NVI.    Imaging Review Plain radiographs demonstrate severe degenerative joint disease of the left hip.   The bone quality appears to be fair for age and reported activity level.  Preoperative templating of the joint replacement has been completed, documented, and submitted to the Operating Room personnel in order to optimize intra-operative equipment management.  Assessment: OA LEFT HIP Active Problems:   * No active hospital problems. *   Plan: Plan for Procedure(s): TOTAL HIP ARTHROPLASTY ANTERIOR APPROACH  The patient history, physical exam, clinical judgement of the provider and imaging are consistent with end stage degenerative joint disease and total joint arthroplasty is deemed medically necessary. The treatment options including medical management, injection therapy, and arthroplasty were discussed at length. The risks and benefits of Procedure(s): TOTAL HIP ARTHROPLASTY ANTERIOR APPROACH were presented and reviewed.  The risks of nonoperative treatment, versus surgical intervention including but not limited to continued pain, aseptic loosening, stiffness,  dislocation/subluxation, infection, bleeding,  nerve injury, blood clots, cardiopulmonary complications, morbidity, mortality, among others were discussed. The patient verbalizes understanding and wishes to proceed with the plan.  Patient is being admitted for surgery, pain control, PT, prophylactic antibiotics, VTE prophylaxis, progressive ambulation, ADL's and discharge planning.   Dental prophylaxis discussed and recommended for 2 years postoperatively.  The patient does meet the criteria for TXA which will be used perioperatively.   ASA 81 mg BID will be used postoperatively for DVT prophylaxis in addition to SCDs, and early ambulation. Plan for Hydrocodone, Mobic, Tylenol for pain.   Robaxin for muscle spasm.  Zofran for nausea and vomiting. Senokot for constipation prevention Pharmacy- [facility] The patient is planning to be discharged home with OPPT and into the care of her friend Kathie Rhodes who can be reached at [phone] Follow up appt 02/19/23 at 4:15pm     Marzetta Board Office [phone] 01/14/2023 1:39 PM

## 2023-01-22 NOTE — Patient Instructions (Signed)
DUE TO COVID-19 ONLY TWO VISITORS  (aged 73 and older)  ARE ALLOWED TO COME WITH YOU AND STAY IN THE WAITING ROOM ONLY DURING PRE OP AND PROCEDURE.   **NO VISITORS ARE ALLOWED IN THE SHORT STAY AREA OR RECOVERY ROOM!!**  IF YOU WILL BE ADMITTED INTO THE HOSPITAL YOU ARE ALLOWED ONLY FOUR SUPPORT PEOPLE DURING VISITATION HOURS ONLY (7 AM -8PM)   The support person(s) must pass our screening, gel in and out, and wear a mask at all times, including in the patient's room. Patients must also wear a mask when staff or their support person are in the room. Visitors GUEST BADGE MUST BE WORN VISIBLY  One adult visitor may remain with you overnight and MUST be in the room by 8 P.M.     Your procedure is scheduled on: 02/04/23   Report to [facility] Main Entrance    Report to admitting at : 5:15 AM   Call this number if you have problems the morning of surgery [phone]   Do not eat food :After Midnight.   After Midnight you may have the following liquids until : 4:30 AM DAY OF SURGERY  Water Black Coffee (sugar ok, NO MILK/CREAM OR CREAMERS)  Tea (sugar ok, NO MILK/CREAM OR CREAMERS) regular and decaf                             Plain Jell-O (NO RED)                                           Fruit ices (not with fruit pulp, NO RED)                                     Popsicles (NO RED)                                                                  Juice: apple, WHITE grape, WHITE cranberry Sports drinks like Gatorade (NO RED)    The day of surgery:  Drink ONE (1) Pre-Surgery Clear Ensure at : 4:30 AM the morning of surgery. Drink in one sitting. Do not sip.  This drink was given to you during your hospital  pre-op appointment visit. Nothing else to drink after completing the  Pre-Surgery Clear Ensure or G2.          If you have questions, please contact your surgeon's office.   Oral Hygiene is also important to reduce your risk of infection.                                     Remember - BRUSH YOUR TEETH THE MORNING OF SURGERY WITH YOUR REGULAR TOOTHPASTE  DENTURES WILL BE REMOVED PRIOR TO SURGERY PLEASE DO NOT APPLY "Poly grip" OR ADHESIVES!!!   Do NOT smoke after Midnight   Take these medicines the morning of surgery with A SIP OF WATER: escitalopram,omeprazole.Use inhalers as usual.  You may not have any metal on your body including hair pins, jewelry, and body piercing             Do not wear make-up, lotions, powders, perfumes/cologne, or deodorant  Do not wear nail polish including gel and S&S, artificial/acrylic nails, or any other type of covering on natural nails including finger and toenails. If you have artificial nails, gel coating, etc. that needs to be removed by a nail salon please have this removed prior to surgery or surgery may need to be canceled/ delayed if the surgeon/ anesthesia feels like they are unable to be safely monitored.   Do not shave  48 hours prior to surgery.   Do not bring valuables to the hospital. [facility] IS NOT             RESPONSIBLE   FOR VALUABLES.   Contacts, glasses, or bridgework may not be worn into surgery.   Bring small overnight bag day of surgery.   DO NOT BRING YOUR HOME MEDICATIONS TO THE HOSPITAL. PHARMACY WILL DISPENSE MEDICATIONS LISTED ON YOUR MEDICATION LIST TO YOU DURING YOUR ADMISSION IN THE HOSPITAL!    Patients discharged on the day of surgery will not be allowed to drive home.  Someone NEEDS to stay with you for the first 24 hours after anesthesia.   Special Instructions: Bring a copy of your healthcare power of attorney and living will documents         the day of surgery if you haven't scanned them before.              Please read over the following fact sheets you were given: IF YOU HAVE QUESTIONS ABOUT YOUR PRE-OP INSTRUCTIONS PLEASE CALL [phone]   Pre-operative 5 CHG Bath Instructions   You can play a key role in reducing the risk of infection  after surgery. Your skin needs to be as free of germs as possible. You can reduce the number of germs on your skin by washing with CHG (chlorhexidine gluconate) soap before surgery. CHG is an antiseptic soap that kills germs and continues to kill germs even after washing.   DO NOT use if you have an allergy to chlorhexidine/CHG or antibacterial soaps. If your skin becomes reddened or irritated, stop using the CHG and notify one of our RNs at [phone].   Please shower with the CHG soap starting 4 days before surgery using the following schedule:     Please keep in mind the following:  DO NOT shave, including legs and underarms, starting the day of your first shower.   You may shave your face at any point before/day of surgery.  Place clean sheets on your bed the day you start using CHG soap. Use a clean washcloth (not used since being washed) for each shower. DO NOT sleep with pets once you start using the CHG.   CHG Shower Instructions:  If you choose to wash your hair and private area, wash first with your normal shampoo/soap.  After you use shampoo/soap, rinse your hair and body thoroughly to remove shampoo/soap residue.  Turn the water OFF and apply about 3 tablespoons (45 ml) of CHG soap to a CLEAN washcloth.  Apply CHG soap ONLY FROM YOUR NECK DOWN TO YOUR TOES (washing for 3-5 minutes)  DO NOT use CHG soap on face, private areas, open wounds, or sores.  Pay special attention to the area where your surgery is being performed.  If you are having back  surgery, having someone wash your back for you may be helpful. Wait 2 minutes after CHG soap is applied, then you may rinse off the CHG soap.  Pat dry with a clean towel  Put on clean clothes/pajamas   If you choose to wear lotion, please use ONLY the CHG-compatible lotions on the back of this paper.     Additional instructions for the day of surgery: DO NOT APPLY any lotions, deodorants, cologne, or perfumes.   Put on  clean/comfortable clothes.  Brush your teeth.  Ask your nurse before applying any prescription medications to the skin.      CHG Compatible Lotions   Aveeno Moisturizing lotion  Cetaphil Moisturizing Cream  Cetaphil Moisturizing Lotion  Clairol Herbal Essence Moisturizing Lotion, Dry Skin  Clairol Herbal Essence Moisturizing Lotion, Extra Dry Skin  Clairol Herbal Essence Moisturizing Lotion, Normal Skin  Curel Age Defying Therapeutic Moisturizing Lotion with Alpha Hydroxy  Curel Extreme Care Body Lotion  Curel Soothing Hands Moisturizing Hand Lotion  Curel Therapeutic Moisturizing Cream, Fragrance-Free  Curel Therapeutic Moisturizing Lotion, Fragrance-Free  Curel Therapeutic Moisturizing Lotion, Original Formula  Eucerin Daily Replenishing Lotion  Eucerin Dry Skin Therapy Plus Alpha Hydroxy Crme  Eucerin Dry Skin Therapy Plus Alpha Hydroxy Lotion  Eucerin Original Crme  Eucerin Original Lotion  Eucerin Plus Crme Eucerin Plus Lotion  Eucerin TriLipid Replenishing Lotion  Keri Anti-Bacterial Hand Lotion  Keri Deep Conditioning Original Lotion Dry Skin Formula Softly Scented  Keri Deep Conditioning Original Lotion, Fragrance Free Sensitive Skin Formula  Keri Lotion Fast Absorbing Fragrance Free Sensitive Skin Formula  Keri Lotion Fast Absorbing Softly Scented Dry Skin Formula  Keri Original Lotion  Keri Skin Renewal Lotion Keri Silky Smooth Lotion  Keri Silky Smooth Sensitive Skin Lotion  Nivea Body Creamy Conditioning Oil  Nivea Body Extra Enriched Lotion  Nivea Body Original Lotion  Nivea Body Sheer Moisturizing Lotion Nivea Crme  Nivea Skin Firming Lotion  NutraDerm 30 Skin Lotion  NutraDerm Skin Lotion  NutraDerm Therapeutic Skin Cream  NutraDerm Therapeutic Skin Lotion  ProShield Protective Hand Cream  Provon moisturizing lotion   Incentive Spirometer  An incentive spirometer is a tool that can help keep your lungs clear and active. This tool measures how well  you are filling your lungs with each breath. Taking long deep breaths may help reverse or decrease the chance of developing breathing (pulmonary) problems (especially infection) following: A long period of time when you are unable to move or be active. BEFORE THE PROCEDURE  If the spirometer includes an indicator to show your best effort, your nurse or respiratory therapist will set it to a desired goal. If possible, sit up straight or lean slightly forward. Try not to slouch. Hold the incentive spirometer in an upright position. INSTRUCTIONS FOR USE  Sit on the edge of your bed if possible, or sit up as far as you can in bed or on a chair. Hold the incentive spirometer in an upright position. Breathe out normally. Place the mouthpiece in your mouth and seal your lips tightly around it. Breathe in slowly and as deeply as possible, raising the piston or the ball toward the top of the column. Hold your breath for 3-5 seconds or for as long as possible. Allow the piston or ball to fall to the bottom of the column. Remove the mouthpiece from your mouth and breathe out normally. Rest for a few seconds and repeat Steps 1 through 7 at least 10 times every 1-2 hours when you are  awake. Take your time and take a few normal breaths between deep breaths. The spirometer may include an indicator to show your best effort. Use the indicator as a goal to work toward during each repetition. After each set of 10 deep breaths, practice coughing to be sure your lungs are clear. If you have an incision (the cut made at the time of surgery), support your incision when coughing by placing a pillow or rolled up towels firmly against it. Once you are able to get out of bed, walk around indoors and cough well. You may stop using the incentive spirometer when instructed by your caregiver.  RISKS AND COMPLICATIONS Take your time so you do not get dizzy or light-headed. If you are in pain, you may need to take or ask for  pain medication before doing incentive spirometry. It is harder to take a deep breath if you are having pain. AFTER USE Rest and breathe slowly and easily. It can be helpful to keep track of a log of your progress. Your caregiver can provide you with a simple table to help with this. If you are using the spirometer at home, follow these instructions: SEEK MEDICAL CARE IF:  You are having difficultly using the spirometer. You have trouble using the spirometer as often as instructed. Your pain medication is not giving enough relief while using the spirometer. You develop fever of 100.5 F (38.1 C) or higher. SEEK IMMEDIATE MEDICAL CARE IF:  You cough up bloody sputum that had not been present before. You develop fever of 102 F (38.9 C) or greater. You develop worsening pain at or near the incision site. MAKE SURE YOU:  Understand these instructions. Will watch your condition. Will get help right away if you are not doing well or get worse. Document Released: 12/09/2006 Document Revised: 10/21/2011 Document Reviewed: 02/09/2007 [facility] Patient Information 2014 [place], [place].   ________________________________________________________________________

## 2023-01-23 ENCOUNTER — Other Ambulatory Visit: Payer: Self-pay

## 2023-01-23 ENCOUNTER — Encounter (HOSPITAL_COMMUNITY): Payer: Self-pay

## 2023-01-23 ENCOUNTER — Encounter (HOSPITAL_COMMUNITY)
Admission: RE | Admit: 2023-01-23 | Discharge: 2023-01-23 | Disposition: A | Discharge status: Home / Self Care | Payer: Medicare PPO | Source: Ambulatory Visit | Attending: Orthopedic Surgery | Admitting: Orthopedic Surgery

## 2023-01-23 VITALS — BP 131/87 | HR 71 | Temp 98.1°F | Ht 62.5 in | Wt 123.0 lb

## 2023-01-23 DIAGNOSIS — Z01818 Encounter for other preprocedural examination: Secondary | ICD-10-CM

## 2023-01-23 DIAGNOSIS — R0789 Other chest pain: Secondary | ICD-10-CM | POA: Diagnosis not present

## 2023-01-23 DIAGNOSIS — Z01812 Encounter for preprocedural laboratory examination: Secondary | ICD-10-CM | POA: Diagnosis not present

## 2023-01-23 HISTORY — DX: Depression, unspecified: F32.A

## 2023-01-23 LAB — CBC
HCT: 38.3 % (ref 36.0–46.0)
Hemoglobin: 12.6 g/dL (ref 12.0–15.0)
MCH: 30.7 pg (ref 26.0–34.0)
MCHC: 32.9 g/dL (ref 30.0–36.0)
MCV: 93.4 fL (ref 80.0–100.0)
Platelets: 211 10*3/uL (ref 150–400)
RBC: 4.1 MIL/uL (ref 3.87–5.11)
RDW: 13.6 % (ref 11.5–15.5)
WBC: 6.2 10*3/uL (ref 4.0–10.5)
nRBC: 0 % (ref 0.0–0.2)

## 2023-01-23 LAB — BASIC METABOLIC PANEL
Anion gap: 6 (ref 5–15)
BUN: 17 mg/dL (ref 8–23)
CO2: 28 mmol/L (ref 22–32)
Calcium: 9.5 mg/dL (ref 8.9–10.3)
Chloride: 105 mmol/L (ref 98–111)
Creatinine, Ser: 0.72 mg/dL (ref 0.44–1.00)
GFR, Estimated: >60 mL/min (ref >60)
Glucose, Bld: 104 mg/dL — ABNORMAL HIGH (ref 70–99)
Potassium: 3.9 mmol/L (ref 3.5–5.1)
Sodium: 139 mmol/L (ref 135–145)

## 2023-01-23 LAB — SURGICAL PCR SCREEN
MRSA, PCR: NEGATIVE
Staphylococcus aureus: NEGATIVE

## 2023-01-23 NOTE — Progress Notes (Addendum)
For Short Stay: COVID SWAB appointment date:  Bowel Prep reminder:   For Anesthesia: PCP - Dr. Gweneth Dimitri.Clearance: 12/16/22: Chart Cardiologist - Dr. Bryan Lemma. Allergist: Dr. Marcelyn Bruins. : Clearance: 01/01/23: Chart. Chest x-ray -  EKG - 02/09/16 Stress Test -  ECHO -  Cardiac Cath -  Pacemaker/ICD device last checked: Pacemaker orders received: Device Rep notified:  Spinal Cord Stimulator: N/A  Sleep Study -  CPAP -   Fasting Blood Sugar - N/A Checks Blood Sugar _____ times a day Date and result of last Hgb A1c-  Last dose of GLP1 agonist- N/A GLP1 instructions:   Last dose of SGLT-2 inhibitors- N/A SGLT-2 instructions:   Blood Thinner Instructions: N/A Aspirin Instructions: No instructions Last Dose:  Activity level: Can go up a flight of stairs and activities of daily living without stopping and without chest pain and/or shortness of breath   Able to exercise without chest pain and/or shortness of breath  Anesthesia review:   Patient denies shortness of breath, fever, cough and chest pain at PAT appointment   Patient verbalized understanding of instructions that were given to them at the PAT appointment. Patient was also instructed that they will need to review over the PAT instructions again at home before surgery.

## 2023-01-29 NOTE — Care Plan (Signed)
Ortho Bundle Case Management Note  Patient Details  Name: Tiffany Reyes MRN: [id number] Date of Birth: Jul 08, 1951         patient met with PA in the office for H&P. will discharge to home with family to assist. has DME at home. OPPT set up with [employer]. discharge instructions disucssed and questions answered. CM spoke with patient today. states no needs today            DME Arranged:    DME Agency:     HH Arranged:    HH Agency:     Additional Comments: Please contact me with any questions of if this plan should need to change.  Shauna Hugh,  RN,BSN,MHA,CCM  [facility] Orthopaedic Specialist  [phone] 01/29/2023, 7:37 PM

## 2023-02-04 ENCOUNTER — Ambulatory Visit (HOSPITAL_BASED_OUTPATIENT_CLINIC_OR_DEPARTMENT_OTHER): Payer: Medicare PPO | Admitting: Certified Registered Nurse Anesthetist

## 2023-02-04 ENCOUNTER — Encounter (HOSPITAL_COMMUNITY)
Admission: RE | Disposition: A | Discharge status: Home / Self Care | Payer: Self-pay | Source: Ambulatory Visit | Attending: Orthopedic Surgery

## 2023-02-04 ENCOUNTER — Other Ambulatory Visit: Payer: Self-pay

## 2023-02-04 ENCOUNTER — Ambulatory Visit (HOSPITAL_COMMUNITY): Payer: Medicare PPO | Admitting: Certified Registered Nurse Anesthetist

## 2023-02-04 ENCOUNTER — Encounter (HOSPITAL_COMMUNITY): Payer: Self-pay | Admitting: Orthopedic Surgery

## 2023-02-04 ENCOUNTER — Ambulatory Visit (HOSPITAL_COMMUNITY): Payer: Medicare PPO

## 2023-02-04 ENCOUNTER — Ambulatory Visit (HOSPITAL_COMMUNITY)
Admission: RE | Admit: 2023-02-04 | Discharge: 2023-02-04 | Disposition: A | Discharge status: Home / Self Care | Payer: Medicare PPO | Source: Ambulatory Visit | Attending: Orthopedic Surgery | Admitting: Orthopedic Surgery

## 2023-02-04 DIAGNOSIS — K219 Gastro-esophageal reflux disease without esophagitis: Secondary | ICD-10-CM | POA: Diagnosis not present

## 2023-02-04 DIAGNOSIS — Z01818 Encounter for other preprocedural examination: Secondary | ICD-10-CM

## 2023-02-04 DIAGNOSIS — M1612 Unilateral primary osteoarthritis, left hip: Secondary | ICD-10-CM | POA: Insufficient documentation

## 2023-02-04 DIAGNOSIS — Z96642 Presence of left artificial hip joint: Secondary | ICD-10-CM

## 2023-02-04 DIAGNOSIS — Z471 Aftercare following joint replacement surgery: Secondary | ICD-10-CM | POA: Diagnosis not present

## 2023-02-04 HISTORY — PX: TOTAL HIP ARTHROPLASTY: SHX124

## 2023-02-04 LAB — TYPE AND SCREEN
ABO/RH(D): B NEG
Antibody Screen: NEGATIVE

## 2023-02-04 LAB — ABO/RH: ABO/RH(D): B NEG

## 2023-02-04 SURGERY — ARTHROPLASTY, HIP, TOTAL, ANTERIOR APPROACH
Anesthesia: Spinal | Site: Hip | Laterality: Left

## 2023-02-04 MED ORDER — METHOCARBAMOL 500 MG PO TABS: 500.0000 mg | ORAL_TABLET | Freq: Four times a day (QID) | ORAL | Status: DC | PRN

## 2023-02-04 MED ORDER — CHLORHEXIDINE GLUCONATE 0.12 % MT SOLN
15.0000 mL | Freq: Once | OROMUCOSAL | Status: AC
  Administered 2023-02-04: 15 mL via OROMUCOSAL

## 2023-02-04 MED ORDER — POVIDONE-IODINE 10 % EX SWAB: 2.0000 | Freq: Once | CUTANEOUS | Status: DC

## 2023-02-04 MED ORDER — BUPIVACAINE IN DEXTROSE 0.75-8.25 % IT SOLN
INTRATHECAL | Status: DC | PRN
  Administered 2023-02-04: 1.6 mL via INTRATHECAL

## 2023-02-04 MED ORDER — FENTANYL CITRATE (PF) 100 MCG/2ML IJ SOLN
INTRAMUSCULAR | Status: AC
  Filled 2023-02-04: qty 2

## 2023-02-04 MED ORDER — CEFAZOLIN SODIUM-DEXTROSE 2-4 GM/100ML-% IV SOLN
2.0000 g | INTRAVENOUS | Status: AC
  Administered 2023-02-04: 2 g via INTRAVENOUS
  Filled 2023-02-04: qty 100

## 2023-02-04 MED ORDER — ONDANSETRON HCL 4 MG/2ML IJ SOLN
INTRAMUSCULAR | Status: AC
  Filled 2023-02-04: qty 2

## 2023-02-04 MED ORDER — DEXAMETHASONE SODIUM PHOSPHATE 10 MG/ML IJ SOLN
8.0000 mg | Freq: Once | INTRAMUSCULAR | Status: AC
  Administered 2023-02-04: 8 mg via INTRAVENOUS

## 2023-02-04 MED ORDER — HYDROCODONE-ACETAMINOPHEN 5-325 MG PO TABS: 1.0000 | ORAL_TABLET | ORAL | Status: DC | PRN

## 2023-02-04 MED ORDER — OXYCODONE HCL 5 MG PO TABS: 5.0000 mg | ORAL_TABLET | Freq: Once | ORAL | Status: DC | PRN

## 2023-02-04 MED ORDER — LACTATED RINGERS IV BOLUS
250.0000 mL | Freq: Once | INTRAVENOUS | Status: AC
  Administered 2023-02-04: 250 mL via INTRAVENOUS

## 2023-02-04 MED ORDER — ACETAMINOPHEN 500 MG PO TABS
1000.0000 mg | ORAL_TABLET | Freq: Once | ORAL | Status: AC
  Administered 2023-02-04: 1000 mg via ORAL
  Filled 2023-02-04: qty 2

## 2023-02-04 MED ORDER — EPHEDRINE 5 MG/ML INJ
INTRAVENOUS | Status: AC
  Filled 2023-02-04: qty 5

## 2023-02-04 MED ORDER — ASPIRIN 81 MG PO TBEC: 81.0000 mg | DELAYED_RELEASE_TABLET | Freq: Two times a day (BID) | ORAL | 0 refills | Status: AC

## 2023-02-04 MED ORDER — ONDANSETRON HCL 4 MG/2ML IJ SOLN
INTRAMUSCULAR | Status: DC | PRN
  Administered 2023-02-04: 4 mg via INTRAVENOUS

## 2023-02-04 MED ORDER — WATER FOR IRRIGATION, STERILE IR SOLN
Status: DC | PRN
  Administered 2023-02-04: 2000 mL

## 2023-02-04 MED ORDER — BUPIVACAINE LIPOSOME 1.3 % IJ SUSP
INTRAMUSCULAR | Status: DC | PRN
  Administered 2023-02-04: 10 mL

## 2023-02-04 MED ORDER — METHOCARBAMOL 500 MG PO TABS
ORAL_TABLET | ORAL | Status: AC
  Administered 2023-02-04: 500 mg via ORAL
  Filled 2023-02-04: qty 1

## 2023-02-04 MED ORDER — HYDROCODONE-ACETAMINOPHEN 10-325 MG PO TABS: 1.0000 | ORAL_TABLET | Freq: Four times a day (QID) | ORAL | 0 refills | Status: DC | PRN

## 2023-02-04 MED ORDER — METHOCARBAMOL 500 MG IVPB - SIMPLE MED: 500.0000 mg | Freq: Four times a day (QID) | INTRAVENOUS | Status: DC | PRN

## 2023-02-04 MED ORDER — PROPOFOL 1000 MG/100ML IV EMUL
INTRAVENOUS | Status: AC
  Filled 2023-02-04: qty 100

## 2023-02-04 MED ORDER — SODIUM CHLORIDE (PF) 0.9 % IJ SOLN
INTRAMUSCULAR | Status: AC
  Filled 2023-02-04: qty 10

## 2023-02-04 MED ORDER — ACETAMINOPHEN 500 MG PO TABS: 500.0000 mg | ORAL_TABLET | Freq: Three times a day (TID) | ORAL | 0 refills | Status: AC | PRN

## 2023-02-04 MED ORDER — 0.9 % SODIUM CHLORIDE (POUR BTL) OPTIME
TOPICAL | Status: DC | PRN
  Administered 2023-02-04: 1000 mL

## 2023-02-04 MED ORDER — GLYCOPYRROLATE 0.2 MG/ML IJ SOLN
INTRAMUSCULAR | Status: DC | PRN
  Administered 2023-02-04: .2 mg via INTRAVENOUS

## 2023-02-04 MED ORDER — ONDANSETRON 4 MG PO TBDP: 4.0000 mg | ORAL_TABLET | Freq: Three times a day (TID) | ORAL | 0 refills | Status: DC | PRN

## 2023-02-04 MED ORDER — PHENYLEPHRINE HCL-NACL 20-0.9 MG/250ML-% IV SOLN
INTRAVENOUS | Status: DC | PRN
  Administered 2023-02-04: 35 ug/min via INTRAVENOUS

## 2023-02-04 MED ORDER — HYDROMORPHONE HCL 1 MG/ML IJ SOLN: 0.2500 mg | INTRAMUSCULAR | Status: DC | PRN

## 2023-02-04 MED ORDER — BUPIVACAINE LIPOSOME 1.3 % IJ SUSP
INTRAMUSCULAR | Status: AC
  Filled 2023-02-04: qty 10

## 2023-02-04 MED ORDER — PROPOFOL 10 MG/ML IV BOLUS
INTRAVENOUS | Status: DC | PRN
  Administered 2023-02-04: 20 mg via INTRAVENOUS
  Administered 2023-02-04: 10 mg via INTRAVENOUS
  Administered 2023-02-04: 20 mg via INTRAVENOUS

## 2023-02-04 MED ORDER — ORAL CARE MOUTH RINSE: 15.0000 mL | Freq: Once | OROMUCOSAL | Status: AC

## 2023-02-04 MED ORDER — TRANEXAMIC ACID-NACL 1000-0.7 MG/100ML-% IV SOLN
1000.0000 mg | INTRAVENOUS | Status: AC
  Administered 2023-02-04: 1000 mg via INTRAVENOUS
  Filled 2023-02-04: qty 100

## 2023-02-04 MED ORDER — TRANEXAMIC ACID-NACL 1000-0.7 MG/100ML-% IV SOLN
1000.0000 mg | Freq: Once | INTRAVENOUS | Status: AC
  Administered 2023-02-04: 1000 mg via INTRAVENOUS

## 2023-02-04 MED ORDER — LACTATED RINGERS IV BOLUS
500.0000 mL | Freq: Once | INTRAVENOUS | Status: AC
  Administered 2023-02-04: 500 mL via INTRAVENOUS

## 2023-02-04 MED ORDER — METHOCARBAMOL 750 MG PO TABS: 750.0000 mg | ORAL_TABLET | Freq: Three times a day (TID) | ORAL | 0 refills | Status: DC | PRN

## 2023-02-04 MED ORDER — SENNA-DOCUSATE SODIUM 8.6-50 MG PO TABS: 2.0000 | ORAL_TABLET | Freq: Every day | ORAL | 1 refills | Status: DC | PRN

## 2023-02-04 MED ORDER — EPHEDRINE SULFATE-NACL 50-0.9 MG/10ML-% IV SOSY
PREFILLED_SYRINGE | INTRAVENOUS | Status: DC | PRN
  Administered 2023-02-04 (×2): 5 mg via INTRAVENOUS

## 2023-02-04 MED ORDER — MELOXICAM 15 MG PO TABS: 15.0000 mg | ORAL_TABLET | Freq: Every day | ORAL | 0 refills | Status: DC | PRN

## 2023-02-04 MED ORDER — ARTIFICIAL TEARS OPHTHALMIC OINT
TOPICAL_OINTMENT | OPHTHALMIC | Status: AC
  Filled 2023-02-04: qty 3.5

## 2023-02-04 MED ORDER — OXYCODONE HCL 5 MG/5ML PO SOLN: 5.0000 mg | Freq: Once | ORAL | Status: DC | PRN

## 2023-02-04 MED ORDER — LACTATED RINGERS IV SOLN: INTRAVENOUS | Status: DC

## 2023-02-04 MED ORDER — ONDANSETRON HCL 4 MG/2ML IJ SOLN: 4.0000 mg | Freq: Once | INTRAMUSCULAR | Status: DC | PRN

## 2023-02-04 MED ORDER — HYDROCODONE-ACETAMINOPHEN 5-325 MG PO TABS
ORAL_TABLET | ORAL | Status: AC
  Administered 2023-02-04: 1 via ORAL
  Filled 2023-02-04: qty 1

## 2023-02-04 MED ORDER — PROPOFOL 500 MG/50ML IV EMUL
INTRAVENOUS | Status: DC | PRN
  Administered 2023-02-04: 75 ug/kg/min via INTRAVENOUS

## 2023-02-04 MED ORDER — SODIUM CHLORIDE (PF) 0.9 % IJ SOLN
INTRAMUSCULAR | Status: DC | PRN
  Administered 2023-02-04: 10 mL

## 2023-02-04 MED ORDER — BUPIVACAINE LIPOSOME 1.3 % IJ SUSP: 10.0000 mL | Freq: Once | INTRAMUSCULAR | Status: DC

## 2023-02-04 MED ORDER — DEXAMETHASONE SODIUM PHOSPHATE 10 MG/ML IJ SOLN
INTRAMUSCULAR | Status: AC
  Filled 2023-02-04: qty 1

## 2023-02-04 MED ORDER — TRANEXAMIC ACID-NACL 1000-0.7 MG/100ML-% IV SOLN
INTRAVENOUS | Status: AC
  Filled 2023-02-04: qty 100

## 2023-02-04 SURGICAL SUPPLY — 46 items
APL PRP STRL LF DISP 70% ISPRP (MISCELLANEOUS) ×1
BAG COUNTER SPONGE SURGICOUNT (BAG) IMPLANT
BAG SPEC THK2 15X12 ZIP CLS (MISCELLANEOUS)
BAG SPNG CNTER NS LX DISP (BAG) ×1
BAG ZIPLOCK 12X15 (MISCELLANEOUS) IMPLANT
BLADE SAG 18X100X1.27 (BLADE) ×2 IMPLANT
CHLORAPREP W/TINT 26 (MISCELLANEOUS) ×2 IMPLANT
CLSR STERI-STRIP ANTIMIC 1/2X4 (GAUZE/BANDAGES/DRESSINGS) ×2 IMPLANT
COVER PERINEAL POST (MISCELLANEOUS) ×2 IMPLANT
COVER SURGICAL LIGHT HANDLE (MISCELLANEOUS) ×2 IMPLANT
DRAPE IMP U-DRAPE 54X76 (DRAPES) ×2 IMPLANT
DRAPE STERI IOBAN 125X83 (DRAPES) ×2 IMPLANT
DRAPE U-SHAPE 47X51 STRL (DRAPES) ×4 IMPLANT
DRSG MEPILEX POST OP 4X12 (GAUZE/BANDAGES/DRESSINGS) IMPLANT
DRSG MEPILEX POST OP 4X8 (GAUZE/BANDAGES/DRESSINGS) ×2 IMPLANT
ELECT REM PT RETURN 15FT ADLT (MISCELLANEOUS) ×2 IMPLANT
GLOVE BIO SURGEON STRL SZ7.5 (GLOVE) ×2 IMPLANT
GLOVE BIOGEL PI IND STRL 7.5 (GLOVE) ×2 IMPLANT
GLOVE BIOGEL PI IND STRL 8 (GLOVE) ×2 IMPLANT
GLOVE SURG SYN 7.5 E (GLOVE) ×1 IMPLANT
GLOVE SURG SYN 7.5 PF PI (GLOVE) ×2 IMPLANT
GOWN STRL REUS W/ TWL LRG LVL3 (GOWN DISPOSABLE) ×2 IMPLANT
GOWN STRL REUS W/ TWL XL LVL3 (GOWN DISPOSABLE) ×2 IMPLANT
GOWN STRL REUS W/TWL LRG LVL3 (GOWN DISPOSABLE) ×1
GOWN STRL REUS W/TWL XL LVL3 (GOWN DISPOSABLE) ×1
HEAD BIOLOX HIP 36/-5 (Joint) IMPLANT
HIP BIOLOX HD 36/-5 (Joint) ×1 IMPLANT
HOLDER FOLEY CATH W/STRAP (MISCELLANEOUS) IMPLANT
INSERT 0 DEGREE 36 (Miscellaneous) IMPLANT
KIT TURNOVER KIT A (KITS) IMPLANT
MANIFOLD NEPTUNE II (INSTRUMENTS) ×2 IMPLANT
NS IRRIG 1000ML POUR BTL (IV SOLUTION) ×2 IMPLANT
PACK ANTERIOR HIP CUSTOM (KITS) ×2 IMPLANT
PROTECTOR NERVE ULNAR (MISCELLANEOUS) ×2 IMPLANT
SCREW HEX LP 6.5X20 (Screw) IMPLANT
SHELL TRIDENT II CLUST 50 (Shell) IMPLANT
SPIKE FLUID TRANSFER (MISCELLANEOUS) ×4 IMPLANT
STEM HIP 4 127DEG (Stem) IMPLANT
SUT MNCRL AB 3-0 PS2 18 (SUTURE) ×2 IMPLANT
SUT VIC AB 0 CT1 36 (SUTURE) ×2 IMPLANT
SUT VIC AB 1 CT1 36 (SUTURE) ×2 IMPLANT
SUT VIC AB 2-0 CT1 27 (SUTURE) ×1
SUT VIC AB 2-0 CT1 TAPERPNT 27 (SUTURE) ×2 IMPLANT
TRAY FOLEY MTR SLVR 16FR STAT (SET/KITS/TRAYS/PACK) IMPLANT
TUBE SUCTION HIGH CAP CLEAR NV (SUCTIONS) ×2 IMPLANT
WATER STERILE IRR 1000ML POUR (IV SOLUTION) ×4 IMPLANT

## 2023-02-04 NOTE — Evaluation (Signed)
Physical Therapy Evaluation Patient Details Name: Tiffany Reyes MRN: [id number] DOB: 1951-04-17 Today's Date: 02/04/2023  History of Present Illness  72 yo female presents to therapy s/p L THA, anterior approach due to failure of conservative measures. Pt PMH includes but is not limited to: asthma, HDL, GERD, herperparathyriodism, lumbar radiculopathy, and  R wrist fx.  Clinical Impression    Tiffany Reyes is a 72 y.o. female POD 0 s/p L THA. Patient reports IND with mobility at baseline. Patient is now limited by functional impairments (see PT problem list below) and requires min guard and cues for transfers and gait with RW. Patient was able to ambulate 50 feet x 2  with RW and min guard and cues for safe walker management. Patient educated on safe sequencing for stair mobility, car tranfers, fall risk prevention, slowly increasing activity level, pain management including use of CP/ice pt and sister verbalized  understanding of safe guarding position for people assisting with mobility. Patient instructed in exercises to facilitate ROM and circulation reviewed with HO provided. Patient will benefit from continued skilled PT interventions to address impairments and progress towards PLOF. Patient has met mobility goals at adequate level for discharge home with family and friend support and OPPT services starting 6/1; will continue to follow if pt continues acute stay to progress towards Mod I goals.       Recommendations for follow up therapy are one component of a multi-disciplinary discharge planning process, led by the attending physician.  Recommendations may be updated based on patient status, additional functional criteria and insurance authorization.  Follow Up Recommendations       Assistance Recommended at Discharge Intermittent Supervision/Assistance  Patient can return home with the following  A little help with walking and/or transfers;A little help with  bathing/dressing/bathroom;Assistance with cooking/housework;Assist for transportation;Help with stairs or ramp for entrance    Equipment Recommendations None recommended by PT (pt reports DME in home setting)  Recommendations for Other Services       Functional Status Assessment Patient has had a recent decline in their functional status and demonstrates the ability to make significant improvements in function in a reasonable and predictable amount of time.     Precautions / Restrictions Precautions Precautions: Fall Restrictions Weight Bearing Restrictions: No      Mobility  Bed Mobility Overal bed mobility: Needs Assistance Bed Mobility: Supine to Sit     Supine to sit: Supervision     General bed mobility comments: cues    Transfers Overall transfer level: Needs assistance Equipment used: Rolling walker (2 wheels) Transfers: Sit to/from Stand Sit to Stand: Min guard           General transfer comment: cues for proper UE and AD placement, consistant use of RW    Ambulation/Gait Ambulation/Gait assistance: Supervision Gait Distance (Feet): 50 Feet Assistive device: Rolling walker (2 wheels) Gait Pattern/deviations: Step-to pattern, Antalgic Gait velocity: decreased     General Gait Details: narrow BOS, step to pattern with slow cadence  Stairs Stairs: Yes Stairs assistance: Min guard Stair Management: Two rails Number of Stairs: 2 General stair comments: cues for proper sequencing for step to pattern, pt and family ed provdied on proper use of RW for step navigation  Wheelchair Mobility    Modified Rankin (Stroke Patients Only)       Balance Overall balance assessment: Needs assistance Sitting-balance support: Bilateral upper extremity supported Sitting balance-Leahy Scale: Good     Standing balance support: Bilateral upper extremity supported, During  functional activity, Reliant on assistive device for balance Standing balance-Leahy Scale:  Poor                               Pertinent Vitals/Pain Pain Assessment Pain Assessment: 0-10 Pain Score: 6  Pain Location: L hip Pain Descriptors / Indicators: Aching, Constant, Discomfort, Operative site guarding Pain Intervention(s): Limited activity within patient's tolerance, Monitored during session, Premedicated before session, Repositioned, Ice applied    Home Living Family/patient expects to be discharged to:: Private residence Living Arrangements: Alone Available Help at Discharge: Family;Friend(s) Type of Home: House Home Access: Stairs to enter Entrance Stairs-Rails: None Entrance Stairs-Number of Steps: 1   Home Layout: One level Home Equipment: Agricultural consultant (2 wheels);Rollator (4 wheels);Shower seat;Grab bars - tub/shower      Prior Function Prior Level of Function : Independent/Modified Independent;Driving             Mobility Comments: IND with all ADLs, self care tasks, ADLs, IADLs and driving       Hand Dominance        Extremity/Trunk Assessment        Lower Extremity Assessment Lower Extremity Assessment: LLE deficits/detail LLE Deficits / Details: ankle DF.PF 5/5 LLE Sensation: decreased light touch (proximally feels funny)    Cervical / Trunk Assessment Cervical / Trunk Assessment: Normal  Communication   Communication: No difficulties  Cognition Arousal/Alertness: Awake/alert Behavior During Therapy: WFL for tasks assessed/performed Overall Cognitive Status: Within Functional Limits for tasks assessed                                          General Comments      Exercises Total Joint Exercises Ankle Circles/Pumps: AROM, Both, 20 reps Quad Sets: AROM, Left, 5 reps Heel Slides: AROM, Left, 5 reps Hip ABduction/ADduction: AROM, Left, 5 reps, Supine, Standing Long Arc Quad: AROM, Left, 5 reps Knee Flexion: AROM, Left, 5 reps, Standing   Assessment/Plan    PT Assessment Patient needs  continued PT services  PT Problem List Decreased strength;Decreased range of motion;Decreased activity tolerance;Decreased balance;Decreased mobility;Decreased coordination;Pain       PT Treatment Interventions DME instruction;Gait training;Stair training;Functional mobility training;Therapeutic activities;Therapeutic exercise;Balance training;Neuromuscular re-education;Patient/family education;Modalities    PT Goals (Current goals can be found in the Care Plan section)  Acute Rehab PT Goals Patient Stated Goal: to be able to get back to gardening, amb on uneven surfaces PT Goal Formulation: With patient Time For Goal Achievement: 02/18/23 Potential to Achieve Goals: Good    Frequency       Co-evaluation               AM-PAC PT "6 Clicks" Mobility  Outcome Measure Help needed turning from your back to your side while in a flat bed without using bedrails?: None Help needed moving from lying on your back to sitting on the side of a flat bed without using bedrails?: A Little Help needed moving to and from a bed to a chair (including a wheelchair)?: A Little Help needed standing up from a chair using your arms (e.g., wheelchair or bedside chair)?: A Little Help needed to walk in hospital room?: A Little Help needed climbing 3-5 steps with a railing? : A Little 6 Click Score: 19    End of Session Equipment Utilized During Treatment: Gait belt Activity Tolerance: Patient  tolerated treatment well Patient left: in chair;with call bell/phone within reach;with family/visitor present Nurse Communication: Mobility status;Other (comment) (pt readiness for d/c) PT Visit Diagnosis: Unsteadiness on feet (R26.81);Other abnormalities of gait and mobility (R26.89);Muscle weakness (generalized) (M62.81);Difficulty in walking, not elsewhere classified (R26.2);Pain Pain - Right/Left: Left Pain - part of body: Hip    Time: 2956-2130 PT Time Calculation (min) (ACUTE ONLY): 47 min   Charges:    PT Evaluation $PT Eval Low Complexity: 1 Low PT Treatments $Gait Training: 8-22 mins $Therapeutic Exercise: 8-22 mins         Johnny Bridge, PT Acute Rehab   Jacqualyn Posey 02/04/2023, 2:24 PM

## 2023-02-04 NOTE — Interval H&P Note (Signed)
History and Physical Interval Note:  02/04/2023 7:01 AM  Tiffany Reyes  has presented today for surgery, with the diagnosis of OA LEFT HIP.  The various methods of treatment have been discussed with the patient and family. After consideration of risks, benefits and other options for treatment, the patient has consented to  Procedure(s): TOTAL HIP ARTHROPLASTY ANTERIOR APPROACH (Left) as a surgical intervention.  The patient's history has been reviewed, patient examined, no change in status, stable for surgery.  I have reviewed the patient's chart and labs.  Questions were answered to the patient's satisfaction.     Sheral Apley

## 2023-02-04 NOTE — Discharge Instructions (Addendum)

## 2023-02-04 NOTE — Transfer of Care (Signed)
Immediate Anesthesia Transfer of Care Note  Patient: Tiffany Reyes  Procedure(s) Performed: TOTAL HIP ARTHROPLASTY ANTERIOR APPROACH (Left: Hip)  Patient Location: PACU  Anesthesia Type:Spinal  Level of Consciousness: awake, alert , and oriented  Airway & Oxygen Therapy: Patient Spontanous Breathing and Patient connected to face mask oxygen  Post-op Assessment: Report given to RN and Post -op Vital signs reviewed and stable  Post vital signs: Reviewed and stable  Last Vitals:  Vitals Value Taken Time  BP 107/67 02/04/23 0910  Temp    Pulse 78 02/04/23 0912  Resp 12 02/04/23 0912  SpO2 100 % 02/04/23 0912  Vitals shown include unvalidated device data.  Last Pain:  Vitals:   02/04/23 0603  TempSrc: Oral  PainSc:       Patients Stated Pain Goal: 4 (02/04/23 0558)  Complications: No notable events documented.

## 2023-02-04 NOTE — Anesthesia Postprocedure Evaluation (Signed)
Anesthesia Post Note  Patient: Tiffany Reyes  Procedure(s) Performed: TOTAL HIP ARTHROPLASTY ANTERIOR APPROACH (Left: Hip)     Patient location during evaluation: PACU Anesthesia Type: Spinal Level of consciousness: oriented and awake and alert Pain management: pain level controlled Vital Signs Assessment: post-procedure vital signs reviewed and stable Respiratory status: spontaneous breathing, respiratory function stable and patient connected to nasal cannula oxygen Cardiovascular status: blood pressure returned to baseline and stable Postop Assessment: no headache, no backache and no apparent nausea or vomiting Anesthetic complications: no  No notable events documented.  Last Vitals:  Vitals:   02/04/23 0930 02/04/23 0945  BP: 121/75 127/73  Pulse: 70 63  Resp: 16 11  Temp:    SpO2: 100% 100%    Last Pain:  Vitals:   02/04/23 0945  TempSrc:   PainSc: 0-No pain                 Gelisa Tieken S

## 2023-02-04 NOTE — Anesthesia Preprocedure Evaluation (Signed)
Anesthesia Evaluation  Patient identified by MRN, date of birth, ID band Patient awake    Reviewed: Allergy & Precautions, H&P , NPO status , Patient's Chart, lab work & pertinent test results  Airway Mallampati: II  TM Distance: >3 FB Neck ROM: Full    Dental no notable dental hx.    Pulmonary neg pulmonary ROS   Pulmonary exam normal breath sounds clear to auscultation       Cardiovascular negative cardio ROS Normal cardiovascular exam Rhythm:Regular Rate:Normal     Neuro/Psych negative neurological ROS  negative psych ROS   GI/Hepatic Neg liver ROS,GERD  ,,  Endo/Other  negative endocrine ROS    Renal/GU negative Renal ROS  negative genitourinary   Musculoskeletal  (+) Arthritis , Osteoarthritis,    Abdominal   Peds negative pediatric ROS (+)  Hematology negative hematology ROS (+)   Anesthesia Other Findings   Reproductive/Obstetrics negative OB ROS                             Anesthesia Physical Anesthesia Plan  ASA: 2  Anesthesia Plan: Spinal   Post-op Pain Management: Tylenol PO (pre-op)*   Induction: Intravenous  PONV Risk Score and Plan: 2 and Ondansetron, Dexamethasone, Propofol infusion and Treatment may vary due to age or medical condition  Airway Management Planned: Simple Face Mask  Additional Equipment:   Intra-op Plan:   Post-operative Plan:   Informed Consent: I have reviewed the patients History and Physical, chart, labs and discussed the procedure including the risks, benefits and alternatives for the proposed anesthesia with the patient or authorized representative who has indicated his/her understanding and acceptance.     Dental advisory given  Plan Discussed with: CRNA and Surgeon  Anesthesia Plan Comments:        Anesthesia Quick Evaluation

## 2023-02-04 NOTE — Anesthesia Procedure Notes (Signed)
Procedure Name: MAC Date/Time: 02/04/2023 7:18 AM  Performed by: Orest Dikes, CRNAPre-anesthesia Checklist: Patient identified, Emergency Drugs available, Patient being monitored and Suction available Oxygen Delivery Method: Simple face mask

## 2023-02-04 NOTE — Op Note (Signed)
02/04/2023  9:30 AM  PATIENT:  Tiffany Reyes   MRN: [id number]  PRE-OPERATIVE DIAGNOSIS:  OA LEFT HIP  POST-OPERATIVE DIAGNOSIS:  OA LEFT HIP  PROCEDURE:  Procedure(s): TOTAL HIP ARTHROPLASTY ANTERIOR APPROACH  PREOPERATIVE INDICATIONS:    Tiffany Reyes is an 72 y.o. female who has a diagnosis of <principal problem not specified> and elected for surgical management after failing conservative treatment.  The risks benefits and alternatives were discussed with the patient including but not limited to the risks of nonoperative treatment, versus surgical intervention including infection, bleeding, nerve injury, periprosthetic fracture, the need for revision surgery, dislocation, leg length discrepancy, blood clots, cardiopulmonary complications, morbidity, mortality, among others, and they were willing to proceed.     OPERATIVE REPORT     SURGEON:   Sheral Apley, MD    ASSISTANT:  Levester Fresh, PA-C, she was present and scrubbed throughout the case, critical for completion in a timely fashion, and for retraction, instrumentation, and closure.     ANESTHESIA:  General    COMPLICATIONS:  None.     COMPONENTS:  [employer] acolade fit femur size 4 with a 36 mm -5 head ball and an acetabular shell size 50 with a  polyethylene liner    PROCEDURE IN DETAIL:   The patient was met in the holding area and  identified.  The appropriate hip was identified and marked at the operative site.  The patient was then transported to the OR  and  placed under anesthesia per that record.  At that point, the patient was  placed in the supine position and  secured to the operating room table and all bony prominences padded. He received pre-operative antibiotics    The operative lower extremity was prepped from the iliac crest to the distal leg.  Sterile draping was performed.  Time out was performed prior to incision.      Skin incision was made just 2 cm lateral to the ASIS  extending in line with the  tensor fascia lata. Electrocautery was used to control all bleeders. I dissected down sharply to the fascia of the tensor fascia lata was confirmed that the muscle fibers beneath were running posteriorly. I then incised the fascia over the superficial tensor fascia lata in line with the incision. The fascia was elevated off the anterior aspect of the muscle the muscle was retracted posteriorly and protected throughout the case. I then used electrocautery to incise the tensor fascia lata fascia control and all bleeders. Immediately visible was the fat over top of the anterior neck and capsule.  I removed the anterior fat from the capsule and elevated the rectus muscle off of the anterior capsule. I then removed a large time of capsule. The retractors were then placed over the anterior acetabulum as well as around the superior and inferior neck.  I then made a femoral neck cut. Then used the power corkscrew to remove the femoral head from the acetabulum and thoroughly irrigated the acetabulum. I sized the femoral head.    I then exposed the deep acetabulum, cleared out any tissue including the ligamentum teres.   After adequate visualization, I excised the labrum, and then sequentially reamed.  I then impacted the acetabular implant into place using fluoroscopy for guidance.  Appropriate version and inclination was confirmed clinically matching their bony anatomy, and with fluoroscopy.  I placed a 20 mm screw in the posterior/superio position with an excellent bite.    I then placed the polyethylene liner in  place  I then adducted the leg and released the external rotators from the posterior femur allowing it to be easily delivered up lateral and anterior to the acetabulum for preparation of the femoral canal.    I then prepared the proximal femur using the cookie-cutter and then sequentially reamed and broached.  A trial broach, neck, and head was utilized, and I reduced the hip and used floroscopy to  assess the neck length and femoral implant.  I then impacted the femoral prosthesis into place into the appropriate version. The hip was then reduced and fluoroscopy confirmed appropriate position. Leg lengths were restored.  I then irrigated the hip copiously again with, and repaired the fascia with Vicryl, followed by monocryl for the subcutaneous tissue, Monocryl for the skin, Steri-Strips and sterile gauze. The patient was then awakened and returned to PACU in stable and satisfactory condition. There were no complications.  POST OPERATIVE PLAN: WBAT, DVT px: SCD's/TED, ambulation and chemical dvt px  Jeanenne Licea, MD Orthopedic Surgeon [phone]     

## 2023-02-04 NOTE — Anesthesia Procedure Notes (Signed)
Spinal  Patient location during procedure: OR Start time: 02/04/2023 7:20 AM End time: 02/04/2023 7:24 AM Reason for block: surgical anesthesia Staffing Performed: anesthesiologist  Anesthesiologist: Eilene Ghazi, MD Performed by: Eilene Ghazi, MD Authorized by: Eilene Ghazi, MD   Preanesthetic Checklist Completed: patient identified, IV checked, site marked, risks and benefits discussed, surgical consent, monitors and equipment checked, pre-op evaluation and timeout performed Spinal Block Patient position: sitting Prep: Betadine Patient monitoring: heart rate, continuous pulse ox and blood pressure Approach: midline Location: L3-4 Injection technique: single-shot Needle Needle type: Sprotte  Needle gauge: 24 G Needle length: 9 cm Assessment Sensory level: T6 Events: CSF return Additional Notes

## 2023-02-05 ENCOUNTER — Encounter (HOSPITAL_COMMUNITY): Payer: Self-pay | Admitting: Orthopedic Surgery

## 2023-02-10 DIAGNOSIS — Z96642 Presence of left artificial hip joint: Secondary | ICD-10-CM | POA: Diagnosis not present

## 2023-02-10 DIAGNOSIS — M1612 Unilateral primary osteoarthritis, left hip: Secondary | ICD-10-CM | POA: Diagnosis not present

## 2023-02-10 DIAGNOSIS — R269 Unspecified abnormalities of gait and mobility: Secondary | ICD-10-CM | POA: Diagnosis not present

## 2023-02-10 DIAGNOSIS — M6281 Muscle weakness (generalized): Secondary | ICD-10-CM | POA: Diagnosis not present

## 2023-02-10 DIAGNOSIS — M25652 Stiffness of left hip, not elsewhere classified: Secondary | ICD-10-CM | POA: Diagnosis not present

## 2023-02-10 DIAGNOSIS — Z471 Aftercare following joint replacement surgery: Secondary | ICD-10-CM | POA: Diagnosis not present

## 2023-02-17 DIAGNOSIS — Z471 Aftercare following joint replacement surgery: Secondary | ICD-10-CM | POA: Diagnosis not present

## 2023-02-17 DIAGNOSIS — M1612 Unilateral primary osteoarthritis, left hip: Secondary | ICD-10-CM | POA: Diagnosis not present

## 2023-02-17 DIAGNOSIS — M6281 Muscle weakness (generalized): Secondary | ICD-10-CM | POA: Diagnosis not present

## 2023-02-17 DIAGNOSIS — Z96642 Presence of left artificial hip joint: Secondary | ICD-10-CM | POA: Diagnosis not present

## 2023-02-17 DIAGNOSIS — R269 Unspecified abnormalities of gait and mobility: Secondary | ICD-10-CM | POA: Diagnosis not present

## 2023-02-17 DIAGNOSIS — M25652 Stiffness of left hip, not elsewhere classified: Secondary | ICD-10-CM | POA: Diagnosis not present

## 2023-02-19 DIAGNOSIS — M1612 Unilateral primary osteoarthritis, left hip: Secondary | ICD-10-CM | POA: Diagnosis not present

## 2023-02-24 DIAGNOSIS — H5203 Hypermetropia, bilateral: Secondary | ICD-10-CM | POA: Diagnosis not present

## 2023-02-24 DIAGNOSIS — Z471 Aftercare following joint replacement surgery: Secondary | ICD-10-CM | POA: Diagnosis not present

## 2023-02-24 DIAGNOSIS — M1612 Unilateral primary osteoarthritis, left hip: Secondary | ICD-10-CM | POA: Diagnosis not present

## 2023-02-24 DIAGNOSIS — H52223 Regular astigmatism, bilateral: Secondary | ICD-10-CM | POA: Diagnosis not present

## 2023-02-24 DIAGNOSIS — R269 Unspecified abnormalities of gait and mobility: Secondary | ICD-10-CM | POA: Diagnosis not present

## 2023-02-24 DIAGNOSIS — H524 Presbyopia: Secondary | ICD-10-CM | POA: Diagnosis not present

## 2023-02-24 DIAGNOSIS — M25652 Stiffness of left hip, not elsewhere classified: Secondary | ICD-10-CM | POA: Diagnosis not present

## 2023-02-24 DIAGNOSIS — M6281 Muscle weakness (generalized): Secondary | ICD-10-CM | POA: Diagnosis not present

## 2023-02-24 DIAGNOSIS — Z96642 Presence of left artificial hip joint: Secondary | ICD-10-CM | POA: Diagnosis not present

## 2023-03-03 DIAGNOSIS — M1612 Unilateral primary osteoarthritis, left hip: Secondary | ICD-10-CM | POA: Diagnosis not present

## 2023-03-03 DIAGNOSIS — Z96642 Presence of left artificial hip joint: Secondary | ICD-10-CM | POA: Diagnosis not present

## 2023-03-03 DIAGNOSIS — M25652 Stiffness of left hip, not elsewhere classified: Secondary | ICD-10-CM | POA: Diagnosis not present

## 2023-03-03 DIAGNOSIS — R269 Unspecified abnormalities of gait and mobility: Secondary | ICD-10-CM | POA: Diagnosis not present

## 2023-03-03 DIAGNOSIS — M6281 Muscle weakness (generalized): Secondary | ICD-10-CM | POA: Diagnosis not present

## 2023-03-03 DIAGNOSIS — Z471 Aftercare following joint replacement surgery: Secondary | ICD-10-CM | POA: Diagnosis not present

## 2023-03-06 DIAGNOSIS — R0789 Other chest pain: Secondary | ICD-10-CM | POA: Diagnosis not present

## 2023-03-06 DIAGNOSIS — Z6822 Body mass index (BMI) 22.0-22.9, adult: Secondary | ICD-10-CM | POA: Diagnosis not present

## 2023-03-06 DIAGNOSIS — J4530 Mild persistent asthma, uncomplicated: Secondary | ICD-10-CM | POA: Diagnosis not present

## 2023-03-06 DIAGNOSIS — K219 Gastro-esophageal reflux disease without esophagitis: Secondary | ICD-10-CM | POA: Diagnosis not present

## 2023-03-07 ENCOUNTER — Other Ambulatory Visit (HOSPITAL_BASED_OUTPATIENT_CLINIC_OR_DEPARTMENT_OTHER): Payer: Self-pay | Admitting: Family Medicine

## 2023-03-07 DIAGNOSIS — R0789 Other chest pain: Secondary | ICD-10-CM

## 2023-03-13 DIAGNOSIS — M6281 Muscle weakness (generalized): Secondary | ICD-10-CM | POA: Diagnosis not present

## 2023-03-13 DIAGNOSIS — Z471 Aftercare following joint replacement surgery: Secondary | ICD-10-CM | POA: Diagnosis not present

## 2023-03-13 DIAGNOSIS — R269 Unspecified abnormalities of gait and mobility: Secondary | ICD-10-CM | POA: Diagnosis not present

## 2023-03-13 DIAGNOSIS — Z96642 Presence of left artificial hip joint: Secondary | ICD-10-CM | POA: Diagnosis not present

## 2023-03-13 DIAGNOSIS — M1612 Unilateral primary osteoarthritis, left hip: Secondary | ICD-10-CM | POA: Diagnosis not present

## 2023-03-13 DIAGNOSIS — M25652 Stiffness of left hip, not elsewhere classified: Secondary | ICD-10-CM | POA: Diagnosis not present

## 2023-03-19 DIAGNOSIS — M6281 Muscle weakness (generalized): Secondary | ICD-10-CM | POA: Diagnosis not present

## 2023-03-19 DIAGNOSIS — M1612 Unilateral primary osteoarthritis, left hip: Secondary | ICD-10-CM | POA: Diagnosis not present

## 2023-03-19 DIAGNOSIS — Z96642 Presence of left artificial hip joint: Secondary | ICD-10-CM | POA: Diagnosis not present

## 2023-03-19 DIAGNOSIS — Z471 Aftercare following joint replacement surgery: Secondary | ICD-10-CM | POA: Diagnosis not present

## 2023-03-19 DIAGNOSIS — R269 Unspecified abnormalities of gait and mobility: Secondary | ICD-10-CM | POA: Diagnosis not present

## 2023-03-19 DIAGNOSIS — M25652 Stiffness of left hip, not elsewhere classified: Secondary | ICD-10-CM | POA: Diagnosis not present

## 2023-03-21 ENCOUNTER — Ambulatory Visit (HOSPITAL_COMMUNITY)
Admission: RE | Admit: 2023-03-21 | Discharge: 2023-03-21 | Disposition: A | Discharge status: Home / Self Care | Payer: Medicare PPO | Source: Ambulatory Visit | Attending: Family Medicine | Admitting: Family Medicine

## 2023-03-21 DIAGNOSIS — R0789 Other chest pain: Secondary | ICD-10-CM

## 2023-03-21 DIAGNOSIS — I2510 Atherosclerotic heart disease of native coronary artery without angina pectoris: Secondary | ICD-10-CM | POA: Diagnosis not present

## 2023-03-26 DIAGNOSIS — R269 Unspecified abnormalities of gait and mobility: Secondary | ICD-10-CM | POA: Diagnosis not present

## 2023-03-26 DIAGNOSIS — M1612 Unilateral primary osteoarthritis, left hip: Secondary | ICD-10-CM | POA: Diagnosis not present

## 2023-03-26 DIAGNOSIS — M6281 Muscle weakness (generalized): Secondary | ICD-10-CM | POA: Diagnosis not present

## 2023-03-26 DIAGNOSIS — M25652 Stiffness of left hip, not elsewhere classified: Secondary | ICD-10-CM | POA: Diagnosis not present

## 2023-03-26 DIAGNOSIS — Z96642 Presence of left artificial hip joint: Secondary | ICD-10-CM | POA: Diagnosis not present

## 2023-03-26 DIAGNOSIS — Z471 Aftercare following joint replacement surgery: Secondary | ICD-10-CM | POA: Diagnosis not present

## 2023-04-07 DIAGNOSIS — M816 Localized osteoporosis [Lequesne]: Secondary | ICD-10-CM | POA: Diagnosis not present

## 2023-04-07 DIAGNOSIS — R32 Unspecified urinary incontinence: Secondary | ICD-10-CM | POA: Diagnosis not present

## 2023-04-07 DIAGNOSIS — J4530 Mild persistent asthma, uncomplicated: Secondary | ICD-10-CM | POA: Diagnosis not present

## 2023-04-07 DIAGNOSIS — E538 Deficiency of other specified B group vitamins: Secondary | ICD-10-CM | POA: Diagnosis not present

## 2023-04-07 DIAGNOSIS — R7309 Other abnormal glucose: Secondary | ICD-10-CM | POA: Diagnosis not present

## 2023-04-07 DIAGNOSIS — K219 Gastro-esophageal reflux disease without esophagitis: Secondary | ICD-10-CM | POA: Diagnosis not present

## 2023-04-07 DIAGNOSIS — E7800 Pure hypercholesterolemia, unspecified: Secondary | ICD-10-CM | POA: Diagnosis not present

## 2023-04-07 DIAGNOSIS — F411 Generalized anxiety disorder: Secondary | ICD-10-CM | POA: Diagnosis not present

## 2023-04-07 DIAGNOSIS — I2510 Atherosclerotic heart disease of native coronary artery without angina pectoris: Secondary | ICD-10-CM | POA: Diagnosis not present

## 2023-04-09 DIAGNOSIS — Z96642 Presence of left artificial hip joint: Secondary | ICD-10-CM | POA: Diagnosis not present

## 2023-04-09 DIAGNOSIS — M25652 Stiffness of left hip, not elsewhere classified: Secondary | ICD-10-CM | POA: Diagnosis not present

## 2023-04-09 DIAGNOSIS — M1612 Unilateral primary osteoarthritis, left hip: Secondary | ICD-10-CM | POA: Diagnosis not present

## 2023-04-09 DIAGNOSIS — M6281 Muscle weakness (generalized): Secondary | ICD-10-CM | POA: Diagnosis not present

## 2023-04-09 DIAGNOSIS — R269 Unspecified abnormalities of gait and mobility: Secondary | ICD-10-CM | POA: Diagnosis not present

## 2023-04-09 DIAGNOSIS — Z471 Aftercare following joint replacement surgery: Secondary | ICD-10-CM | POA: Diagnosis not present

## 2023-04-29 DIAGNOSIS — N3941 Urge incontinence: Secondary | ICD-10-CM | POA: Diagnosis not present

## 2023-04-29 DIAGNOSIS — N3281 Overactive bladder: Secondary | ICD-10-CM | POA: Diagnosis not present

## 2023-04-29 DIAGNOSIS — R351 Nocturia: Secondary | ICD-10-CM | POA: Diagnosis not present

## 2023-04-29 DIAGNOSIS — R350 Frequency of micturition: Secondary | ICD-10-CM | POA: Diagnosis not present

## 2023-05-12 DIAGNOSIS — M1612 Unilateral primary osteoarthritis, left hip: Secondary | ICD-10-CM | POA: Diagnosis not present

## 2023-05-22 ENCOUNTER — Ambulatory Visit: Payer: Medicare PPO | Admitting: Allergy

## 2023-06-05 ENCOUNTER — Other Ambulatory Visit: Payer: Self-pay

## 2023-06-05 ENCOUNTER — Encounter: Payer: Self-pay | Admitting: Allergy

## 2023-06-05 ENCOUNTER — Ambulatory Visit: Payer: Medicare PPO | Admitting: Allergy

## 2023-06-05 VITALS — BP 100/70 | HR 68 | Temp 97.9°F | Resp 20 | Ht 62.5 in | Wt 127.5 lb

## 2023-06-05 DIAGNOSIS — H1013 Acute atopic conjunctivitis, bilateral: Secondary | ICD-10-CM

## 2023-06-05 DIAGNOSIS — J4540 Moderate persistent asthma, uncomplicated: Secondary | ICD-10-CM

## 2023-06-05 DIAGNOSIS — K219 Gastro-esophageal reflux disease without esophagitis: Secondary | ICD-10-CM | POA: Diagnosis not present

## 2023-06-05 DIAGNOSIS — J301 Allergic rhinitis due to pollen: Secondary | ICD-10-CM | POA: Diagnosis not present

## 2023-06-05 MED ORDER — ALBUTEROL SULFATE HFA 108 (90 BASE) MCG/ACT IN AERS: 2.0000 | INHALATION_SPRAY | Freq: Four times a day (QID) | RESPIRATORY_TRACT | 1 refills | Status: DC | PRN

## 2023-06-05 MED ORDER — FLUTICASONE PROPIONATE HFA 110 MCG/ACT IN AERO: 2.0000 | INHALATION_SPRAY | Freq: Two times a day (BID) | RESPIRATORY_TRACT | 5 refills | Status: DC

## 2023-06-05 MED ORDER — LEVOCETIRIZINE DIHYDROCHLORIDE 5 MG PO TABS: 5.0000 mg | ORAL_TABLET | Freq: Every evening | ORAL | 5 refills | Status: AC

## 2023-06-05 MED ORDER — OLOPATADINE HCL 0.2 % OP SOLN: 1.0000 [drp] | Freq: Every day | OPHTHALMIC | 5 refills | Status: AC | PRN

## 2023-06-05 MED ORDER — BUDESONIDE-FORMOTEROL FUMARATE 160-4.5 MCG/ACT IN AERO: 2.0000 | INHALATION_SPRAY | Freq: Every day | RESPIRATORY_TRACT | 3 refills | Status: AC | PRN

## 2023-06-05 NOTE — Patient Instructions (Signed)
Asthmatic bronchitis   - doing well currently!  - lung function today looks fantastic and is stable from previous study  - have access to albuterol inhaler 2 puffs every 4-6 hours as needed for cough/wheeze/shortness of breath/chest tightness.  May use 15-20 minutes prior to activity.   Monitor frequency of use.    - continue montelukast 10 mg daily-take at bedtime  - continue Flovent 2 puffs in AM and if needed twice a day use  - can use Symbicort as a rescue as well and can use up to 10 puffs/day of Symbicort  Control goals:  Full participation in all desired activities (may need albuterol before activity) Albuterol use two time or less a week on average (not counting use with activity) Cough interfering with sleep two time or less a month Oral steroids no more than once a year No hospitalizations  Allergies  - continue avoidance measures for grass pollens, tree pollens, mold, dust mites, cat, dog, cockroach  - for allergy symptom relief recommend use of long-acting antihistamine like Allegra 180 mg, Zyrtec 10 mg or Xyzal 5 mg as needed  - for watery eyes Pataday 1 drop each eye daily as needed  - for nasal drainage/runny nose you can get over-the-counter Astepro 2 sprays each nostril 1-2 times a day.    Reflux  - continue daily omeprazole for control of reflux   Follow-up 6 months or sooner if needed

## 2023-06-05 NOTE — Progress Notes (Signed)
Follow-up Note  RE: Tiffany Reyes MRN: [id number] DOB: Mar 22, 1951 Date of Office Visit: 06/05/2023   History of present illness: Tiffany Reyes is a 72 y.o. female presenting today for follow-up of asthmatic bronchitis, allergic rhinitis with conjunctivitis and reflux.  She was last seen in the office on 11/20/2022 by myself.  Discussed the use of AI scribe software for clinical note transcription with the patient, who gave verbal consent to proceed.  She reports experiencing chest heaviness, which she initially attributed to acid reflux. To alleviate this, she raised her head of bed, which seemed to help. She recalls using her emergency inhaler once, possibly in September or early summer, due to a perceived need, and noted improvement within 30 minutes. She suspects an allergen in her bedroom, possibly dust on the drapes, as a potential trigger. She tries to vacuums her drapes every two weeks but acknowledges that she should do it more frequently.  Upon waking, she often experiences a runny nose, which improves after washing her face and moving to another room. She has not required any steroids for breathing or allergy control. She has received the RSV and flu vaccines but has not yet received the COVID-19 vaccine. A good sign she mentions mentions a visit to a friend with a large dog and despite  the dog being wet and stinky, she did not experience any significant respiratory distress. She took precautions upon returning home, such as changing clothes and showering. She plans to travel to [place] in April and is considering getting the COVID-19 vaccine before the trip.   Review of systems: 10pt ROS negative unless noted above in HPI   All other systems negative unless noted above in HPI  Past medical/social/surgical/family history have been reviewed and are unchanged unless specifically indicated below.  No changes  Medication List: Current Outpatient Medications  Medication Sig  Dispense Refill   acetaminophen (TYLENOL) 500 MG tablet Take 1 tablet (500 mg total) by mouth every 8 (eight) hours as needed for mild pain or moderate pain. 60 tablet 0   ascorbic acid (VITAMIN C) 500 MG tablet Take 500 mg by mouth daily.     aspirin EC 81 MG tablet Take 1 tablet (81 mg total) by mouth 2 (two) times daily. To prevent blood clots for 30 days after surgery. 60 tablet 0   calcium carbonate (TUMS EX) 750 MG chewable tablet Chew 1 tablet by mouth as needed for heartburn.     Cholecalciferol (VITAMIN D3) 50 MCG (2000 UT) capsule Take 2,000 Units by mouth daily.     cyanocobalamin 1000 MCG tablet Take 1,000 mcg by mouth daily.     escitalopram (LEXAPRO) 5 MG tablet Take 5 mg by mouth daily.     estradiol (ESTRACE) 0.1 MG/GM vaginal cream Place 1 Applicatorful vaginally See admin instructions. About 5 times a week     levocetirizine (XYZAL) 5 MG tablet Take 1 tablet (5 mg total) by mouth every evening. 30 tablet 5   montelukast (SINGULAIR) 10 MG tablet Take 1 tablet (10 mg total) by mouth at bedtime. 30 tablet 5   Multiple Vitamins-Minerals (EMERGEN-C IMMUNE) PACK Take 1 Package by mouth daily.     MYRBETRIQ 50 MG TB24 tablet Take 50 mg by mouth in the morning.     NON FORMULARY Take 2 capsules by mouth daily. HYDROEYE     Olopatadine HCl 0.2 % SOLN Apply 1 drop to eye daily as needed. 2.5 mL 5   rosuvastatin (CRESTOR) 20  MG tablet Take 20 mg by mouth at bedtime.     albuterol (VENTOLIN HFA) 108 (90 Base) MCG/ACT inhaler Inhale 2 puffs into the lungs every 6 (six) hours as needed for wheezing or shortness of breath. 18 g 1   budesonide-formoterol (SYMBICORT) 160-4.5 MCG/ACT inhaler Inhale 2 puffs into the lungs daily as needed. 10.2 g 3   fluticasone (FLOVENT HFA) 110 MCG/ACT inhaler Inhale 2 puffs into the lungs 2 (two) times daily. 12 g 5   omeprazole (PRILOSEC) 40 MG capsule Take one capsule three times a week if symptoms persist then can go back to daily (Patient not taking: Reported  on 06/05/2023) 90 capsule 2   No current facility-administered medications for this visit.     Known medication allergies: Allergies  Allergen Reactions   Ambien [Zolpidem] Other (See Comments)    Hangover/Headache   Alendronate Other (See Comments)    Joint and bone pain   Codeine Nausea Only   Eszopiclone Other (See Comments)   Fluconazole Hives    Veins stood out   Moxifloxacin Hcl Other (See Comments)    Headache, bodyache   Tetanus Toxoids Swelling    August 1976 last tetanus expierenced intense fever. Swelling in arm.and Neck    Trazodone Other (See Comments)    Hangover   Excedrin Pm [Diphenhydramine-Apap (Sleep)] Other (See Comments)    Hang over   Penicillins Rash    Has patient had a PCN reaction causing immediate rash, facial/tongue/throat swelling, SOB or lightheadedness with hypotension: Yes Has patient had a PCN reaction causing severe rash involving mucus membranes or skin necrosis: No Has patient had a PCN reaction that required hospitalization No Has patient had a PCN reaction occurring within the last 10 years: No If all of the above answers are "NO", then may proceed with Cephalosporin use.    Sulfa Antibiotics Rash    Unknown reaction     Physical examination: Blood pressure 100/70, pulse 68, temperature 97.9 F (36.6 C), resp. rate 20, height 5' 2.5" (1.588 m), weight 127 lb 8 oz (57.8 kg), SpO2 96%.  General: Alert, interactive, in no acute distress. HEENT: PERRLA, TMs pearly gray, turbinates non-edematous without discharge, post-pharynx non erythematous. Neck: Supple without lymphadenopathy. Lungs: Clear to auscultation without wheezing, rhonchi or rales. {no increased work of breathing. CV: Normal S1, S2 without murmurs. Abdomen: Nondistended, nontender. Skin: Warm and dry, without lesions or rashes. Extremities:  No clubbing, cyanosis or edema. Neuro:   Grossly intact.  Diagnositics/Labs:  Spirometry: FEV1: 1.75 L 92%, FVC: 2.25 L 92%,  ratio consistent with nonobstructive pattern of  Assessment and plan:   Asthmatic bronchitis   - doing well currently!  - lung function today looks fantastic and is stable from previous study  - have access to albuterol inhaler 2 puffs every 4-6 hours as needed for cough/wheeze/shortness of breath/chest tightness.  May use 15-20 minutes prior to activity.   Monitor frequency of use.    - continue montelukast 10 mg daily-take at bedtime  - continue Flovent 2 puffs in AM and if needed twice a day use  - can use Symbicort as a rescue as well and can use up to 10 puffs/day of Symbicort  Control goals:  Full participation in all desired activities (may need albuterol before activity) Albuterol use two time or less a week on average (not counting use with activity) Cough interfering with sleep two time or less a month Oral steroids no more than once a year No hospitalizations  Allergic  rhinitis with conjunctivitis  - continue avoidance measures for grass pollens, tree pollens, mold, dust mites, cat, dog, cockroach  - for allergy symptom relief recommend use of long-acting antihistamine like Allegra 180 mg, Zyrtec 10 mg or Xyzal 5 mg as needed  - for watery eyes Pataday 1 drop each eye daily as needed  - for nasal drainage/runny nose you can get over-the-counter Astepro 2 sprays each nostril 1-2 times a day.    Reflux  - continue daily omeprazole for control of reflux   Follow-up 6 months or sooner if needed   I appreciate the opportunity to take part in Tiffany Reyes's care. Please do not hesitate to contact me with questions.  Sincerely,   Margo Aye, MD Allergy/Immunology [facility]

## 2023-06-20 DIAGNOSIS — Z1231 Encounter for screening mammogram for malignant neoplasm of breast: Secondary | ICD-10-CM | POA: Diagnosis not present

## 2023-06-25 DIAGNOSIS — M25511 Pain in right shoulder: Secondary | ICD-10-CM | POA: Diagnosis not present

## 2023-06-25 DIAGNOSIS — M25521 Pain in right elbow: Secondary | ICD-10-CM | POA: Diagnosis not present

## 2023-06-25 DIAGNOSIS — S52124A Nondisplaced fracture of head of right radius, initial encounter for closed fracture: Secondary | ICD-10-CM | POA: Diagnosis not present

## 2023-06-25 DIAGNOSIS — M1612 Unilateral primary osteoarthritis, left hip: Secondary | ICD-10-CM | POA: Diagnosis not present

## 2023-06-25 DIAGNOSIS — W19XXXA Unspecified fall, initial encounter: Secondary | ICD-10-CM | POA: Diagnosis not present

## 2023-06-26 DIAGNOSIS — S52124A Nondisplaced fracture of head of right radius, initial encounter for closed fracture: Secondary | ICD-10-CM | POA: Diagnosis not present

## 2023-06-26 DIAGNOSIS — M25521 Pain in right elbow: Secondary | ICD-10-CM | POA: Diagnosis not present

## 2023-07-08 DIAGNOSIS — S52124A Nondisplaced fracture of head of right radius, initial encounter for closed fracture: Secondary | ICD-10-CM | POA: Diagnosis not present

## 2023-07-15 DIAGNOSIS — M810 Age-related osteoporosis without current pathological fracture: Secondary | ICD-10-CM | POA: Diagnosis not present

## 2023-07-15 DIAGNOSIS — E7800 Pure hypercholesterolemia, unspecified: Secondary | ICD-10-CM | POA: Diagnosis not present

## 2023-07-15 DIAGNOSIS — R739 Hyperglycemia, unspecified: Secondary | ICD-10-CM | POA: Diagnosis not present

## 2023-07-16 DIAGNOSIS — M25519 Pain in unspecified shoulder: Secondary | ICD-10-CM | POA: Diagnosis not present

## 2023-07-21 DIAGNOSIS — M25519 Pain in unspecified shoulder: Secondary | ICD-10-CM | POA: Diagnosis not present

## 2023-07-28 DIAGNOSIS — M25519 Pain in unspecified shoulder: Secondary | ICD-10-CM | POA: Diagnosis not present

## 2023-07-29 DIAGNOSIS — S52124A Nondisplaced fracture of head of right radius, initial encounter for closed fracture: Secondary | ICD-10-CM | POA: Diagnosis not present

## 2023-08-05 DIAGNOSIS — M25519 Pain in unspecified shoulder: Secondary | ICD-10-CM | POA: Diagnosis not present

## 2023-08-11 DIAGNOSIS — M25519 Pain in unspecified shoulder: Secondary | ICD-10-CM | POA: Diagnosis not present

## 2023-08-18 DIAGNOSIS — M810 Age-related osteoporosis without current pathological fracture: Secondary | ICD-10-CM | POA: Diagnosis not present

## 2023-08-19 DIAGNOSIS — M25519 Pain in unspecified shoulder: Secondary | ICD-10-CM | POA: Diagnosis not present

## 2023-08-20 DIAGNOSIS — M25552 Pain in left hip: Secondary | ICD-10-CM | POA: Diagnosis not present

## 2023-08-26 DIAGNOSIS — M25519 Pain in unspecified shoulder: Secondary | ICD-10-CM | POA: Diagnosis not present

## 2023-09-01 DIAGNOSIS — M25519 Pain in unspecified shoulder: Secondary | ICD-10-CM | POA: Diagnosis not present

## 2023-09-02 DIAGNOSIS — M7062 Trochanteric bursitis, left hip: Secondary | ICD-10-CM | POA: Diagnosis not present

## 2023-09-02 DIAGNOSIS — M7632 Iliotibial band syndrome, left leg: Secondary | ICD-10-CM | POA: Diagnosis not present

## 2023-09-02 DIAGNOSIS — M6281 Muscle weakness (generalized): Secondary | ICD-10-CM | POA: Diagnosis not present

## 2023-09-02 DIAGNOSIS — M25519 Pain in unspecified shoulder: Secondary | ICD-10-CM | POA: Diagnosis not present

## 2023-09-05 DIAGNOSIS — M7062 Trochanteric bursitis, left hip: Secondary | ICD-10-CM | POA: Diagnosis not present

## 2023-09-05 DIAGNOSIS — M6281 Muscle weakness (generalized): Secondary | ICD-10-CM | POA: Diagnosis not present

## 2023-09-05 DIAGNOSIS — M7632 Iliotibial band syndrome, left leg: Secondary | ICD-10-CM | POA: Diagnosis not present

## 2023-09-10 DIAGNOSIS — M6281 Muscle weakness (generalized): Secondary | ICD-10-CM | POA: Diagnosis not present

## 2023-09-10 DIAGNOSIS — M25519 Pain in unspecified shoulder: Secondary | ICD-10-CM | POA: Diagnosis not present

## 2023-09-10 DIAGNOSIS — M7062 Trochanteric bursitis, left hip: Secondary | ICD-10-CM | POA: Diagnosis not present

## 2023-09-10 DIAGNOSIS — M7632 Iliotibial band syndrome, left leg: Secondary | ICD-10-CM | POA: Diagnosis not present

## 2023-09-12 DIAGNOSIS — M7062 Trochanteric bursitis, left hip: Secondary | ICD-10-CM | POA: Diagnosis not present

## 2023-09-12 DIAGNOSIS — M7632 Iliotibial band syndrome, left leg: Secondary | ICD-10-CM | POA: Diagnosis not present

## 2023-09-12 DIAGNOSIS — M6281 Muscle weakness (generalized): Secondary | ICD-10-CM | POA: Diagnosis not present

## 2023-09-15 DIAGNOSIS — M25519 Pain in unspecified shoulder: Secondary | ICD-10-CM | POA: Diagnosis not present

## 2023-09-15 DIAGNOSIS — Z1159 Encounter for screening for other viral diseases: Secondary | ICD-10-CM | POA: Diagnosis not present

## 2023-09-15 DIAGNOSIS — Z6822 Body mass index (BMI) 22.0-22.9, adult: Secondary | ICD-10-CM | POA: Diagnosis not present

## 2023-09-15 DIAGNOSIS — Z0000 Encounter for general adult medical examination without abnormal findings: Secondary | ICD-10-CM | POA: Diagnosis not present

## 2023-09-15 DIAGNOSIS — Z23 Encounter for immunization: Secondary | ICD-10-CM | POA: Diagnosis not present

## 2023-09-15 DIAGNOSIS — Z1331 Encounter for screening for depression: Secondary | ICD-10-CM | POA: Diagnosis not present

## 2023-09-17 ENCOUNTER — Other Ambulatory Visit (HOSPITAL_COMMUNITY): Payer: Self-pay | Admitting: Orthopedic Surgery

## 2023-09-17 DIAGNOSIS — M6281 Muscle weakness (generalized): Secondary | ICD-10-CM | POA: Diagnosis not present

## 2023-09-17 DIAGNOSIS — M25519 Pain in unspecified shoulder: Secondary | ICD-10-CM | POA: Diagnosis not present

## 2023-09-17 DIAGNOSIS — M7062 Trochanteric bursitis, left hip: Secondary | ICD-10-CM | POA: Diagnosis not present

## 2023-09-17 DIAGNOSIS — M545 Low back pain, unspecified: Secondary | ICD-10-CM

## 2023-09-17 DIAGNOSIS — M7632 Iliotibial band syndrome, left leg: Secondary | ICD-10-CM | POA: Diagnosis not present

## 2023-09-17 DIAGNOSIS — M25552 Pain in left hip: Secondary | ICD-10-CM | POA: Diagnosis not present

## 2023-09-17 DIAGNOSIS — Z96642 Presence of left artificial hip joint: Secondary | ICD-10-CM

## 2023-09-23 DIAGNOSIS — M7062 Trochanteric bursitis, left hip: Secondary | ICD-10-CM | POA: Diagnosis not present

## 2023-09-23 DIAGNOSIS — M6281 Muscle weakness (generalized): Secondary | ICD-10-CM | POA: Diagnosis not present

## 2023-09-23 DIAGNOSIS — M7632 Iliotibial band syndrome, left leg: Secondary | ICD-10-CM | POA: Diagnosis not present

## 2023-09-24 ENCOUNTER — Encounter (HOSPITAL_COMMUNITY)
Admission: RE | Admit: 2023-09-24 | Discharge: 2023-09-24 | Disposition: A | Discharge status: Home / Self Care | Payer: Medicare PPO | Source: Ambulatory Visit | Attending: Orthopedic Surgery | Admitting: Orthopedic Surgery

## 2023-09-24 ENCOUNTER — Ambulatory Visit (HOSPITAL_COMMUNITY)
Admission: RE | Admit: 2023-09-24 | Discharge: 2023-09-24 | Disposition: A | Discharge status: Home / Self Care | Payer: Medicare PPO | Source: Ambulatory Visit | Attending: Orthopedic Surgery | Admitting: Orthopedic Surgery

## 2023-09-24 DIAGNOSIS — M5126 Other intervertebral disc displacement, lumbar region: Secondary | ICD-10-CM | POA: Diagnosis not present

## 2023-09-24 DIAGNOSIS — Z471 Aftercare following joint replacement surgery: Secondary | ICD-10-CM | POA: Diagnosis not present

## 2023-09-24 DIAGNOSIS — Z96642 Presence of left artificial hip joint: Secondary | ICD-10-CM | POA: Insufficient documentation

## 2023-09-24 DIAGNOSIS — M545 Low back pain, unspecified: Secondary | ICD-10-CM | POA: Diagnosis not present

## 2023-09-24 DIAGNOSIS — M48061 Spinal stenosis, lumbar region without neurogenic claudication: Secondary | ICD-10-CM | POA: Diagnosis not present

## 2023-09-24 DIAGNOSIS — M5137 Other intervertebral disc degeneration, lumbosacral region with discogenic back pain only: Secondary | ICD-10-CM | POA: Diagnosis not present

## 2023-09-24 DIAGNOSIS — M5136 Other intervertebral disc degeneration, lumbar region with discogenic back pain only: Secondary | ICD-10-CM | POA: Diagnosis not present

## 2023-09-24 DIAGNOSIS — M25552 Pain in left hip: Secondary | ICD-10-CM | POA: Diagnosis not present

## 2023-09-24 MED ORDER — TECHNETIUM TC 99M MEDRONATE IV KIT
20.0000 | PACK | Freq: Once | INTRAVENOUS | Status: AC | PRN
  Administered 2023-09-24: 21.5 via INTRAVENOUS

## 2023-09-25 DIAGNOSIS — M6281 Muscle weakness (generalized): Secondary | ICD-10-CM | POA: Diagnosis not present

## 2023-09-25 DIAGNOSIS — M7062 Trochanteric bursitis, left hip: Secondary | ICD-10-CM | POA: Diagnosis not present

## 2023-09-25 DIAGNOSIS — M7632 Iliotibial band syndrome, left leg: Secondary | ICD-10-CM | POA: Diagnosis not present

## 2023-09-29 DIAGNOSIS — M25519 Pain in unspecified shoulder: Secondary | ICD-10-CM | POA: Diagnosis not present

## 2023-09-30 DIAGNOSIS — M7632 Iliotibial band syndrome, left leg: Secondary | ICD-10-CM | POA: Diagnosis not present

## 2023-09-30 DIAGNOSIS — M7062 Trochanteric bursitis, left hip: Secondary | ICD-10-CM | POA: Diagnosis not present

## 2023-09-30 DIAGNOSIS — M6281 Muscle weakness (generalized): Secondary | ICD-10-CM | POA: Diagnosis not present

## 2023-10-02 DIAGNOSIS — M25519 Pain in unspecified shoulder: Secondary | ICD-10-CM | POA: Diagnosis not present

## 2023-10-03 DIAGNOSIS — M7062 Trochanteric bursitis, left hip: Secondary | ICD-10-CM | POA: Diagnosis not present

## 2023-10-03 DIAGNOSIS — Z1159 Encounter for screening for other viral diseases: Secondary | ICD-10-CM | POA: Diagnosis not present

## 2023-10-03 DIAGNOSIS — R7303 Prediabetes: Secondary | ICD-10-CM | POA: Diagnosis not present

## 2023-10-03 DIAGNOSIS — E538 Deficiency of other specified B group vitamins: Secondary | ICD-10-CM | POA: Diagnosis not present

## 2023-10-03 DIAGNOSIS — E7800 Pure hypercholesterolemia, unspecified: Secondary | ICD-10-CM | POA: Diagnosis not present

## 2023-10-03 DIAGNOSIS — M6281 Muscle weakness (generalized): Secondary | ICD-10-CM | POA: Diagnosis not present

## 2023-10-03 DIAGNOSIS — M816 Localized osteoporosis [Lequesne]: Secondary | ICD-10-CM | POA: Diagnosis not present

## 2023-10-03 DIAGNOSIS — M7632 Iliotibial band syndrome, left leg: Secondary | ICD-10-CM | POA: Diagnosis not present

## 2023-10-06 DIAGNOSIS — M7062 Trochanteric bursitis, left hip: Secondary | ICD-10-CM | POA: Diagnosis not present

## 2023-10-06 DIAGNOSIS — M25519 Pain in unspecified shoulder: Secondary | ICD-10-CM | POA: Diagnosis not present

## 2023-10-07 DIAGNOSIS — M810 Age-related osteoporosis without current pathological fracture: Secondary | ICD-10-CM | POA: Diagnosis not present

## 2023-10-07 DIAGNOSIS — I2510 Atherosclerotic heart disease of native coronary artery without angina pectoris: Secondary | ICD-10-CM | POA: Diagnosis not present

## 2023-10-07 DIAGNOSIS — J4530 Mild persistent asthma, uncomplicated: Secondary | ICD-10-CM | POA: Diagnosis not present

## 2023-10-07 DIAGNOSIS — R413 Other amnesia: Secondary | ICD-10-CM | POA: Diagnosis not present

## 2023-10-07 DIAGNOSIS — E7800 Pure hypercholesterolemia, unspecified: Secondary | ICD-10-CM | POA: Diagnosis not present

## 2023-10-07 DIAGNOSIS — K219 Gastro-esophageal reflux disease without esophagitis: Secondary | ICD-10-CM | POA: Diagnosis not present

## 2023-10-07 DIAGNOSIS — Z23 Encounter for immunization: Secondary | ICD-10-CM | POA: Diagnosis not present

## 2023-10-07 DIAGNOSIS — R32 Unspecified urinary incontinence: Secondary | ICD-10-CM | POA: Diagnosis not present

## 2023-10-07 DIAGNOSIS — R7303 Prediabetes: Secondary | ICD-10-CM | POA: Diagnosis not present

## 2023-10-08 ENCOUNTER — Other Ambulatory Visit: Payer: Self-pay | Admitting: Family Medicine

## 2023-10-08 DIAGNOSIS — M7062 Trochanteric bursitis, left hip: Secondary | ICD-10-CM | POA: Diagnosis not present

## 2023-10-08 DIAGNOSIS — M6281 Muscle weakness (generalized): Secondary | ICD-10-CM | POA: Diagnosis not present

## 2023-10-08 DIAGNOSIS — M7632 Iliotibial band syndrome, left leg: Secondary | ICD-10-CM | POA: Diagnosis not present

## 2023-10-08 DIAGNOSIS — R413 Other amnesia: Secondary | ICD-10-CM

## 2023-10-09 DIAGNOSIS — M25519 Pain in unspecified shoulder: Secondary | ICD-10-CM | POA: Diagnosis not present

## 2023-10-10 DIAGNOSIS — M7062 Trochanteric bursitis, left hip: Secondary | ICD-10-CM | POA: Diagnosis not present

## 2023-10-10 DIAGNOSIS — M6281 Muscle weakness (generalized): Secondary | ICD-10-CM | POA: Diagnosis not present

## 2023-10-10 DIAGNOSIS — M7632 Iliotibial band syndrome, left leg: Secondary | ICD-10-CM | POA: Diagnosis not present

## 2023-10-13 DIAGNOSIS — M25519 Pain in unspecified shoulder: Secondary | ICD-10-CM | POA: Diagnosis not present

## 2023-10-14 DIAGNOSIS — J4530 Mild persistent asthma, uncomplicated: Secondary | ICD-10-CM | POA: Diagnosis not present

## 2023-10-14 DIAGNOSIS — F411 Generalized anxiety disorder: Secondary | ICD-10-CM | POA: Diagnosis not present

## 2023-10-14 DIAGNOSIS — R32 Unspecified urinary incontinence: Secondary | ICD-10-CM | POA: Diagnosis not present

## 2023-10-14 DIAGNOSIS — R413 Other amnesia: Secondary | ICD-10-CM | POA: Diagnosis not present

## 2023-10-14 DIAGNOSIS — R443 Hallucinations, unspecified: Secondary | ICD-10-CM | POA: Diagnosis not present

## 2023-10-14 DIAGNOSIS — Z79899 Other long term (current) drug therapy: Secondary | ICD-10-CM | POA: Diagnosis not present

## 2023-10-14 DIAGNOSIS — Z6822 Body mass index (BMI) 22.0-22.9, adult: Secondary | ICD-10-CM | POA: Diagnosis not present

## 2023-10-14 DIAGNOSIS — Z820 Family history of epilepsy and other diseases of the nervous system: Secondary | ICD-10-CM | POA: Diagnosis not present

## 2023-10-14 DIAGNOSIS — K219 Gastro-esophageal reflux disease without esophagitis: Secondary | ICD-10-CM | POA: Diagnosis not present

## 2023-10-15 ENCOUNTER — Encounter: Payer: Self-pay | Admitting: Physician Assistant

## 2023-10-15 DIAGNOSIS — M6281 Muscle weakness (generalized): Secondary | ICD-10-CM | POA: Diagnosis not present

## 2023-10-15 DIAGNOSIS — M7062 Trochanteric bursitis, left hip: Secondary | ICD-10-CM | POA: Diagnosis not present

## 2023-10-15 DIAGNOSIS — M7632 Iliotibial band syndrome, left leg: Secondary | ICD-10-CM | POA: Diagnosis not present

## 2023-10-16 ENCOUNTER — Other Ambulatory Visit: Payer: Self-pay | Admitting: Allergy

## 2023-10-17 DIAGNOSIS — M7632 Iliotibial band syndrome, left leg: Secondary | ICD-10-CM | POA: Diagnosis not present

## 2023-10-17 DIAGNOSIS — M6281 Muscle weakness (generalized): Secondary | ICD-10-CM | POA: Diagnosis not present

## 2023-10-17 DIAGNOSIS — M7062 Trochanteric bursitis, left hip: Secondary | ICD-10-CM | POA: Diagnosis not present

## 2023-10-20 DIAGNOSIS — M25519 Pain in unspecified shoulder: Secondary | ICD-10-CM | POA: Diagnosis not present

## 2023-10-21 DIAGNOSIS — M6281 Muscle weakness (generalized): Secondary | ICD-10-CM | POA: Diagnosis not present

## 2023-10-21 DIAGNOSIS — M7062 Trochanteric bursitis, left hip: Secondary | ICD-10-CM | POA: Diagnosis not present

## 2023-10-21 DIAGNOSIS — M7632 Iliotibial band syndrome, left leg: Secondary | ICD-10-CM | POA: Diagnosis not present

## 2023-10-22 DIAGNOSIS — M25519 Pain in unspecified shoulder: Secondary | ICD-10-CM | POA: Diagnosis not present

## 2023-10-24 DIAGNOSIS — M6281 Muscle weakness (generalized): Secondary | ICD-10-CM | POA: Diagnosis not present

## 2023-10-24 DIAGNOSIS — M25519 Pain in unspecified shoulder: Secondary | ICD-10-CM | POA: Diagnosis not present

## 2023-10-24 DIAGNOSIS — M7632 Iliotibial band syndrome, left leg: Secondary | ICD-10-CM | POA: Diagnosis not present

## 2023-10-24 DIAGNOSIS — M7062 Trochanteric bursitis, left hip: Secondary | ICD-10-CM | POA: Diagnosis not present

## 2023-10-28 DIAGNOSIS — M7632 Iliotibial band syndrome, left leg: Secondary | ICD-10-CM | POA: Diagnosis not present

## 2023-10-28 DIAGNOSIS — M7062 Trochanteric bursitis, left hip: Secondary | ICD-10-CM | POA: Diagnosis not present

## 2023-10-28 DIAGNOSIS — M25519 Pain in unspecified shoulder: Secondary | ICD-10-CM | POA: Diagnosis not present

## 2023-10-28 DIAGNOSIS — M6281 Muscle weakness (generalized): Secondary | ICD-10-CM | POA: Diagnosis not present

## 2023-10-30 DIAGNOSIS — M25519 Pain in unspecified shoulder: Secondary | ICD-10-CM | POA: Diagnosis not present

## 2023-10-31 DIAGNOSIS — M6281 Muscle weakness (generalized): Secondary | ICD-10-CM | POA: Diagnosis not present

## 2023-10-31 DIAGNOSIS — M7632 Iliotibial band syndrome, left leg: Secondary | ICD-10-CM | POA: Diagnosis not present

## 2023-10-31 DIAGNOSIS — M7062 Trochanteric bursitis, left hip: Secondary | ICD-10-CM | POA: Diagnosis not present

## 2023-11-03 DIAGNOSIS — M25519 Pain in unspecified shoulder: Secondary | ICD-10-CM | POA: Diagnosis not present

## 2023-11-05 DIAGNOSIS — M25552 Pain in left hip: Secondary | ICD-10-CM | POA: Diagnosis not present

## 2023-11-09 ENCOUNTER — Ambulatory Visit
Admission: RE | Admit: 2023-11-09 | Discharge: 2023-11-09 | Disposition: A | Discharge status: Home / Self Care | Payer: Medicare PPO | Source: Ambulatory Visit | Attending: Family Medicine | Admitting: Family Medicine

## 2023-11-09 DIAGNOSIS — R413 Other amnesia: Secondary | ICD-10-CM

## 2023-11-09 MED ORDER — GADOPICLENOL 0.5 MMOL/ML IV SOLN
5.0000 mL | Freq: Once | INTRAVENOUS | Status: AC | PRN
  Administered 2023-11-09: 5 mL via INTRAVENOUS

## 2023-11-10 DIAGNOSIS — M25519 Pain in unspecified shoulder: Secondary | ICD-10-CM | POA: Diagnosis not present

## 2023-11-12 ENCOUNTER — Ambulatory Visit: Payer: Medicare PPO | Admitting: Allergy

## 2023-11-12 ENCOUNTER — Encounter: Payer: Self-pay | Admitting: Allergy

## 2023-11-12 ENCOUNTER — Other Ambulatory Visit: Payer: Self-pay

## 2023-11-12 VITALS — BP 118/62 | HR 67 | Temp 98.0°F | Resp 12

## 2023-11-12 DIAGNOSIS — H1013 Acute atopic conjunctivitis, bilateral: Secondary | ICD-10-CM | POA: Diagnosis not present

## 2023-11-12 DIAGNOSIS — J4540 Moderate persistent asthma, uncomplicated: Secondary | ICD-10-CM

## 2023-11-12 DIAGNOSIS — J301 Allergic rhinitis due to pollen: Secondary | ICD-10-CM | POA: Diagnosis not present

## 2023-11-12 DIAGNOSIS — K219 Gastro-esophageal reflux disease without esophagitis: Secondary | ICD-10-CM

## 2023-11-12 DIAGNOSIS — M25519 Pain in unspecified shoulder: Secondary | ICD-10-CM | POA: Diagnosis not present

## 2023-11-12 NOTE — Patient Instructions (Addendum)
 Asthmatic bronchitis   - under great control at this time!  - lung function today is normal  - have access to albuterol inhaler 2 puffs every 4-6 hours as needed for cough/wheeze/shortness of breath/chest tightness.  May use 15-20 minutes prior to activity.   Monitor frequency of use.    - continue montelukast 10 mg daily-take at bedtime  - continue Flovent 2 puffs in AM and if needed twice a day use  - can use Symbicort as a rescue as well and can use up to 10 puffs/day of Symbicort  Control goals:  Full participation in all desired activities (may need albuterol before activity) Albuterol use two time or less a week on average (not counting use with activity) Cough interfering with sleep two time or less a month Oral steroids no more than once a year No hospitalizations  Allergies  - continue avoidance measures for grass pollens, tree pollens, mold, dust mites, cat, dog, cockroach  - for allergy symptom relief recommend use of long-acting antihistamine like Allegra 180 mg, Zyrtec 10 mg or Xyzal 5 mg as needed  - for watery eyes Pataday 1 drop each eye daily as needed  - for nasal drainage/runny nose you can get over-the-counter Astepro 2 sprays each nostril 1-2 times a day.    Reflux  - continue daily omeprazole for control of reflux   Follow-up 6 months or sooner if needed

## 2023-11-12 NOTE — Progress Notes (Signed)
 Follow-up Note  RE: Tiffany Reyes MRN: [id number] DOB: [birth year] Date of Office Visit: 11/12/2023   History of present illness: Tiffany Reyes is a 73 y.o. female presenting today for follow-up of asthma bronchitis, allergic rhinitis with conjunctivitis and reflux.  She was last seen in the office on 06/05/23 by myself.  Discussed the use of AI scribe software for clinical note transcription with the patient, who gave verbal consent to proceed.  Over the past six months, she has experienced a couple of episodes where she mistakenly used her regular inhaler instead of her emergency inhaler. These episodes resolved within about thirty minutes and were brief and infrequent, occurring only a couple of times. She has not experienced such episodes before and is unsure of the trigger.  She has used her emergency inhaler once or twice since January due to unavoidable triggers. She is aware of the high pollen counts and takes measures to manage her symptoms.  She notes mild allergy symptoms such as rhinorrhea and ocular pruritus, particularly in the kitchen area where there are trees outside the window. She had no reaction to a pot of tulips she recently acquired but had to remove another flower arrangement from her kitchen due to symptoms. She uses Flonase but does not currently have a nasal spray like Astapro or azelastine, which she has used in the past for nasal drainage.  Her current medication regimen includes a Singulair nightly, Flovent, and an emergency inhaler. Her symptoms are mild, requiring only a tissue for her rhinorrhea.  She is preparing for a cruise this month and expresses concern about potential exposure to illnesses, as she previously contracted an illness on a cruise. She mentions having received update COVID vaccine and is considering taking additional precautions such as taking vitamin supplements to boost her immune system during the trip.     Review of systems:  10pt ROS  negative unless noted  Past medical/social/surgical/family history have been reviewed and are unchanged unless specifically indicated below.  No changes  Medication List: Current Outpatient Medications  Medication Sig Dispense Refill   albuterol (VENTOLIN HFA) 108 (90 Base) MCG/ACT inhaler Inhale 2 puffs into the lungs every 6 (six) hours as needed for wheezing or shortness of breath. 18 g 1   ascorbic acid (VITAMIN C) 500 MG tablet Take 500 mg by mouth daily.     aspirin EC 81 MG tablet Take 1 tablet (81 mg total) by mouth 2 (two) times daily. To prevent blood clots for 30 days after surgery. 60 tablet 0   Cholecalciferol (VITAMIN D3) 50 MCG (2000 UT) capsule Take 2,000 Units by mouth daily.     cyanocobalamin 1000 MCG tablet Take 1,000 mcg by mouth daily.     escitalopram (LEXAPRO) 5 MG tablet Take 5 mg by mouth daily.     estradiol (ESTRACE) 0.1 MG/GM vaginal cream Place 1 Applicatorful vaginally See admin instructions. About 5 times a week     fluticasone (FLOVENT HFA) 110 MCG/ACT inhaler Inhale 2 puffs into the lungs 2 (two) times daily. 12 g 5   montelukast (SINGULAIR) 10 MG tablet Take 1 tablet (10 mg total) by mouth at bedtime. 30 tablet 2   MYRBETRIQ 50 MG TB24 tablet Take 50 mg by mouth in the morning.     omeprazole (PRILOSEC) 40 MG capsule Take one capsule three times a week if symptoms persist then can go back to daily 90 capsule 2   rosuvastatin (CRESTOR) 20 MG tablet Take 20 mg  by mouth at bedtime.     acetaminophen (TYLENOL) 500 MG tablet Take 1 tablet (500 mg total) by mouth every 8 (eight) hours as needed for mild pain or moderate pain. (Patient not taking: Reported on 11/12/2023) 60 tablet 0   budesonide-formoterol (SYMBICORT) 160-4.5 MCG/ACT inhaler Inhale 2 puffs into the lungs daily as needed. (Patient not taking: Reported on 11/12/2023) 10.2 g 3   calcium carbonate (TUMS EX) 750 MG chewable tablet Chew 1 tablet by mouth as needed for heartburn. (Patient not taking: Reported  on 11/12/2023)     levocetirizine (XYZAL) 5 MG tablet Take 1 tablet (5 mg total) by mouth every evening. (Patient not taking: Reported on 11/12/2023) 30 tablet 5   Multiple Vitamins-Minerals (EMERGEN-C IMMUNE) PACK Take 1 Package by mouth daily. (Patient not taking: Reported on 11/12/2023)     NON FORMULARY Take 2 capsules by mouth daily. HYDROEYE (Patient not taking: Reported on 11/12/2023)     Olopatadine HCl 0.2 % SOLN Apply 1 drop to eye daily as needed. (Patient not taking: Reported on 11/12/2023) 2.5 mL 5   No current facility-administered medications for this visit.     Known medication allergies: Allergies  Allergen Reactions   Ambien [Zolpidem] Other (See Comments)    Hangover/Headache   Alendronate Other (See Comments)    Joint and bone pain   Codeine Nausea Only   Eszopiclone Other (See Comments)   Fluconazole Hives    Veins stood out   Moxifloxacin Hcl Other (See Comments)    Headache, bodyache   Tetanus Toxoids Swelling    August 1976 last tetanus expierenced intense fever. Swelling in arm.and Neck    Trazodone Other (See Comments)    Hangover   Excedrin Pm [Diphenhydramine-Apap (Sleep)] Other (See Comments)    Hang over   Penicillins Rash    Has patient had a PCN reaction causing immediate rash, facial/tongue/throat swelling, SOB or lightheadedness with hypotension: Yes Has patient had a PCN reaction causing severe rash involving mucus membranes or skin necrosis: No Has patient had a PCN reaction that required hospitalization No Has patient had a PCN reaction occurring within the last 10 years: No If all of the above answers are "NO", then may proceed with Cephalosporin use.    Sulfa Antibiotics Rash    Unknown reaction     Physical examination: Blood pressure 118/62, pulse 67, temperature 98 F (36.7 C), temperature source Temporal, resp. rate 12, SpO2 96%.  General: Alert, interactive, in no acute distress. HEENT: PERRLA, TMs pearly gray, turbinates minimally  edematous without discharge, post-pharynx non erythematous. Neck: Supple without lymphadenopathy. Lungs: Clear to auscultation without wheezing, rhonchi or rales. {no increased work of breathing. CV: Normal S1, S2 without murmurs. Abdomen: Nondistended, nontender. Skin: Warm and dry, without lesions or rashes. Extremities:  No clubbing, cyanosis or edema. Neuro:   Grossly intact.  Diagnositics/Labs: Spirometry: FEV1: 1.7L 90%, FVC: 2.41L 99%, ratio consistent with non-obstructive pattern  Assessment and plan: Asthmatic bronchitis   - under great control at this time!  - lung function today is normal  - have access to albuterol inhaler 2 puffs every 4-6 hours as needed for cough/wheeze/shortness of breath/chest tightness.  May use 15-20 minutes prior to activity.   Monitor frequency of use.    - continue montelukast 10 mg daily-take at bedtime  - continue Flovent 2 puffs in AM and if needed twice a day use  - can use Symbicort as a rescue as well and can use up to 10 puffs/day of  Symbicort  Control goals:  Full participation in all desired activities (may need albuterol before activity) Albuterol use two time or less a week on average (not counting use with activity) Cough interfering with sleep two time or less a month Oral steroids no more than once a year No hospitalizations  Allergic rhinitis with conjunctivitis  - continue avoidance measures for grass pollens, tree pollens, mold, dust mites, cat, dog, cockroach  - for allergy symptom relief recommend use of long-acting antihistamine like Allegra 180 mg, Zyrtec 10 mg or Xyzal 5 mg as needed  - for watery eyes Pataday 1 drop each eye daily as needed  - for nasal drainage/runny nose you can get over-the-counter Astepro 2 sprays each nostril 1-2 times a day.    Reflux  - continue daily omeprazole for control of reflux  Follow-up 6 months or sooner if needed  I appreciate the opportunity to take part in Tiffany Reyes's care. Please do  not hesitate to contact me with questions.  Sincerely,   Margo Aye, MD Allergy/Immunology [facility]

## 2023-11-13 ENCOUNTER — Ambulatory Visit: Admitting: Physician Assistant

## 2023-11-13 ENCOUNTER — Encounter: Payer: Self-pay | Admitting: Physician Assistant

## 2023-11-13 ENCOUNTER — Ambulatory Visit

## 2023-11-13 ENCOUNTER — Other Ambulatory Visit

## 2023-11-13 VITALS — BP 135/80 | HR 72 | Resp 18 | Ht 63.0 in | Wt 127.0 lb

## 2023-11-13 DIAGNOSIS — R413 Other amnesia: Secondary | ICD-10-CM | POA: Diagnosis not present

## 2023-11-13 MED ORDER — RIVASTIGMINE TARTRATE 1.5 MG PO CAPS: ORAL_CAPSULE | ORAL | 11 refills | Status: DC

## 2023-11-13 NOTE — Patient Instructions (Addendum)
 It was a pleasure to see you today at our office.   Recommendations:  Neurocognitive evaluation at our office   Check labs today  [address] Follow up in Oct 13 11:30    For psychiatric meds, mood meds: Please have your primary care physician manage these medications.  If you have any severe symptoms of a stroke, or other severe issues such as confusion,severe chills or fever, etc call 911 or go to the ER as you may need to be evaluated further   For guidance regarding [facility] and if placement were needed at the facility, contact Social Worker tel: [phone]  For assessment of decision of mental capacity and competency:  Call Dr. Erick Blinks, geriatric psychiatrist at [phone]  Counseling regarding caregiver distress, including caregiver depression, anxiety and issues regarding community resources, adult day care programs, adult living facilities, or memory care questions:  please contact your  Primary Doctor's Social Worker   Whom to call: Memory  decline, memory medications: Call our office [phone]    [email or web address]   RECOMMENDATIONS FOR ALL PATIENTS WITH MEMORY PROBLEMS: 1. Continue to exercise (Recommend 30 minutes of walking everyday, or 3 hours every week) 2. Increase social interactions - continue going to [place] and enjoy social gatherings with friends and family 3. Eat healthy, avoid fried foods and eat more fruits and vegetables 4. Maintain adequate blood pressure, blood sugar, and blood cholesterol level. Reducing the risk of stroke and cardiovascular disease also helps promoting better memory. 5. Avoid stressful situations. Live a simple life and avoid aggravations. Organize your time and prepare for the next day in anticipation. 6. Sleep well, avoid any interruptions of sleep and avoid any distractions in the bedroom that may interfere with adequate sleep quality 7. Avoid  sugar, avoid sweets as there is a strong link between excessive sugar intake, diabetes, and cognitive impairment We discussed the Mediterranean diet, which has been shown to help patients reduce the risk of progressive memory disorders and reduces cardiovascular risk. This includes eating fish, eat fruits and green leafy vegetables, nuts like almonds and hazelnuts, walnuts, and also use olive oil. Avoid fast foods and fried foods as much as possible. Avoid sweets and sugar as sugar use has been linked to worsening of memory function.  There is always a concern of gradual progression of memory problems. If this is the case, then we may need to adjust level of care according to patient needs. Support, both to the patient and caregiver, should then be put into place.      You have been referred for a neuropsychological evaluation (i.e., evaluation of memory and thinking abilities). Please bring someone with you to this appointment if possible, as it is helpful for the doctor to hear from both you and another adult who knows you well. Please bring eyeglasses and hearing aids if you wear them.    The evaluation will take approximately 3 hours and has two parts:   The first part is a clinical interview with the neuropsychologist (Dr. Milbert Coulter or Dr. Robbie Lis). During the interview, the neuropsychologist will speak with you and the individual you brought to the appointment.    The second part of the evaluation is testing with the doctor's technician Annabelle Harman or Selena Batten). During the testing, the technician will ask you to remember different types of material, solve problems, and answer some questionnaires. Your family member will not be present for this portion of the evaluation.   Please note: We must reserve several  hours of the neuropsychologist's time and the psychometrician's time for your evaluation appointment. As such, there is a No-Show fee of $100. If you are unable to attend any of your appointments, please  contact our office as soon as possible to reschedule.      DRIVING: Regarding driving, in patients with progressive memory problems, driving will be impaired. We advise to have someone else do the driving if trouble finding directions or if minor accidents are reported. Independent driving assessment is available to determine safety of driving.   If you are interested in the driving assessment, you can contact the following:  [employer] in [place] [phone]  [facility] [phone]  [facility] [phone]  [facility] [phone] or [phone]   FALL PRECAUTIONS: Be cautious when walking. Scan the area for obstacles that may increase the risk of trips and falls. When getting up in the mornings, sit up at the edge of the bed for a few minutes before getting out of bed. Consider elevating the bed at the head end to avoid drop of blood pressure when getting up. Walk always in a well-lit room (use night lights in the walls). Avoid area rugs or power cords from appliances in the middle of the walkways. Use a walker or a cane if necessary and consider physical therapy for balance exercise. Get your eyesight checked regularly.  FINANCIAL OVERSIGHT: Supervision, especially oversight when making financial decisions or transactions is also recommended.  HOME SAFETY: Consider the safety of the kitchen when operating appliances like stoves, microwave oven, and blender. Consider having supervision and share cooking responsibilities until no longer able to participate in those. Accidents with firearms and other hazards in the house should be identified and addressed as well.   ABILITY TO BE LEFT ALONE: If patient is unable to contact 911 operator, consider using LifeLine, or when the need is there, arrange for someone to stay with patients. Smoking is a fire hazard, consider supervision or cessation. Risk of wandering should be assessed by  caregiver and if detected at any point, supervision and safe proof recommendations should be instituted.  MEDICATION SUPERVISION: Inability to self-administer medication needs to be constantly addressed. Implement a mechanism to ensure safe administration of the medications.      Mediterranean Diet A Mediterranean diet refers to food and lifestyle choices that are based on the traditions of countries located on the [place]. This way of eating has been shown to help prevent certain conditions and improve outcomes for people who have chronic diseases, like kidney disease and heart disease. What are tips for following this plan? Lifestyle  Cook and eat meals together with your family, when possible. Drink enough fluid to keep your urine clear or pale yellow. Be physically active every day. This includes: Aerobic exercise like running or swimming. Leisure activities like gardening, walking, or housework. Get 7-8 hours of sleep each night. If recommended by your health care provider, drink red wine in moderation. This means 1 glass a day for nonpregnant women and 2 glasses a day for men. A glass of wine equals 5 oz (150 mL). Reading food labels  Check the serving size of packaged foods. For foods such as rice and pasta, the serving size refers to the amount of cooked product, not dry. Check the total fat in packaged foods. Avoid foods that have saturated fat or trans fats. Check the ingredients list for added sugars, such as corn syrup. Shopping  At the grocery store, buy  most of your food from the areas near the walls of the store. This includes: Fresh fruits and vegetables (produce). Grains, beans, nuts, and seeds. Some of these may be available in unpackaged forms or large amounts (in bulk). Fresh seafood. Poultry and eggs. Low-fat dairy products. Buy whole ingredients instead of prepackaged foods. Buy fresh fruits and vegetables in-season from local farmers markets. Buy  frozen fruits and vegetables in resealable bags. If you do not have access to quality fresh seafood, buy precooked frozen shrimp or canned fish, such as tuna, salmon, or sardines. Buy small amounts of raw or cooked vegetables, salads, or olives from the deli or salad bar at your store. Stock your pantry so you always have certain foods on hand, such as olive oil, canned tuna, canned tomatoes, rice, pasta, and beans. Cooking  Cook foods with extra-virgin olive oil instead of using butter or other vegetable oils. Have meat as a side dish, and have vegetables or grains as your main dish. This means having meat in small portions or adding small amounts of meat to foods like pasta or stew. Use beans or vegetables instead of meat in common dishes like chili or lasagna. Experiment with different cooking methods. Try roasting or broiling vegetables instead of steaming or sauteing them. Add frozen vegetables to soups, stews, pasta, or rice. Add nuts or seeds for added healthy fat at each meal. You can add these to yogurt, salads, or vegetable dishes. Marinate fish or vegetables using olive oil, lemon juice, garlic, and fresh herbs. Meal planning  Plan to eat 1 vegetarian meal one day each week. Try to work up to 2 vegetarian meals, if possible. Eat seafood 2 or more times a week. Have healthy snacks readily available, such as: Vegetable sticks with hummus. Greek yogurt. Fruit and nut trail mix. Eat balanced meals throughout the week. This includes: Fruit: 2-3 servings a day Vegetables: 4-5 servings a day Low-fat dairy: 2 servings a day Fish, poultry, or lean meat: 1 serving a day Beans and legumes: 2 or more servings a week Nuts and seeds: 1-2 servings a day Whole grains: 6-8 servings a day Extra-virgin olive oil: 3-4 servings a day Limit red meat and sweets to only a few servings a month What are my food choices? Mediterranean diet Recommended Grains: Whole-grain pasta. Brown rice. Bulgar  wheat. Polenta. Couscous. Whole-wheat bread. Orpah Cobb. Vegetables: Artichokes. Beets. Broccoli. Cabbage. Carrots. Eggplant. Green beans. Chard. Kale. Spinach. Onions. Leeks. Peas. Squash. Tomatoes. Peppers. Radishes. Fruits: Apples. Apricots. Avocado. Berries. Bananas. Cherries. Dates. Figs. Grapes. Lemons. Melon. Oranges. Peaches. Plums. Pomegranate. Meats and other protein foods: Beans. Almonds. Sunflower seeds. Pine nuts. Peanuts. Cod. Salmon. Scallops. Shrimp. Tuna. Tilapia. Clams. Oysters. Eggs. Dairy: Low-fat milk. Cheese. Greek yogurt. Beverages: Water. Red wine. Herbal tea. Fats and oils: Extra virgin olive oil. Avocado oil. Grape seed oil. Sweets and desserts: [place] yogurt with honey. Baked apples. Poached pears. Trail mix. Seasoning and other foods: Basil. Cilantro. Coriander. Cumin. Mint. Parsley. Sage. Rosemary. Tarragon. Garlic. Oregano. Thyme. Pepper. Balsalmic vinegar. Tahini. Hummus. Tomato sauce. Olives. Mushrooms. Limit these Grains: Prepackaged pasta or rice dishes. Prepackaged cereal with added sugar. Vegetables: Deep fried potatoes (french fries). Fruits: Fruit canned in syrup. Meats and other protein foods: Beef. Pork. Lamb. Poultry with skin. Hot dogs. Tomasa Blase. Dairy: Ice cream. Sour cream. Whole milk. Beverages: Juice. Sugar-sweetened soft drinks. Beer. Liquor and spirits. Fats and oils: Butter. Canola oil. Vegetable oil. Beef fat (tallow). Lard. Sweets and desserts: Cookies. Cakes. Pies. Candy. Seasoning and  other foods: Mayonnaise. Premade sauces and marinades. The items listed may not be a complete list. Talk with your dietitian about what dietary choices are right for you. Summary The Mediterranean diet includes both food and lifestyle choices. Eat a variety of fresh fruits and vegetables, beans, nuts, seeds, and whole grains. Limit the amount of red meat and sweets that you eat. Talk with your health care provider about whether it is safe for you to drink red  wine in moderation. This means 1 glass a day for nonpregnant women and 2 glasses a day for men. A glass of wine equals 5 oz (150 mL). This information is not intended to replace advice given to you by your health care provider. Make sure you discuss any questions you have with your health care provider. Document Released: 03/21/2016 Document Revised: 04/23/2016 Document Reviewed: 03/21/2016 Elsevier Interactive Patient Education  2017 [employer].

## 2023-11-13 NOTE — Progress Notes (Signed)
 Assessment/Plan:     Tiffany Reyes is a very pleasant 73 y.o. year old RH female with a history of hyperlipidemia, arthritis, osteopenia, vitamin D deficiency, with chronic pain, GERD, hemochromatosis carrier, status post single parathyroidectomy, asthma, GAD, seen today for evaluation of memory concerns . MoCA today is 26/30 .  Workup is in progress. She has a strong family history of AD. We discussed the role of ACHI in memory, she agreed to try  a very low dose of an agent, considering rivastigmine 1.5 mg bid. She may be moving to [place] to an independent living community, at which time she would be transferring care to that location, politely declines any aggressive testing at this time.     Memory Difficulties     Neurocognitive testing to further evaluate cognitive concerns and determine other underlying cause of memory changes, including potential contribution from sleep, anxiety, attention, or depression among others  Check B12, B1  Recommend good control of cardiovascular risk factors.   Continue to control mood as per PCP Patient to discuss with PCP sleep issues [place] up in 6 months  Subjective:    The patient is accompanied by her friend who supplements the history.    How long did patient have memory difficulties?  I didn't really noticed but Dr. Johny Sax was concerned because of my family history". Her friend reports some changes over the last 4 months.   Patient reports some difficulty remembering new information, recent conversations. "I don't pay attention to names but I remember faces". "I have good visual memory". She has concerns about being dyslexic. Does newspaper puzzles, word jumble.  repeats oneself?  Endorsed by her friend Disoriented when walking into a room?  Patient denies    Leaving objects in unusual places?  Denies.    Wandering behavior? Denies.   Any personality changes, or depression, anxiety? Denies.  Hallucinations or paranoia? One episode:   1 month ago, she read a text that  her friend was not going to [place], " I read it 3 times, but when I came home, the text said "I am saving a seat for you". She is concerned that she had hallucinated the message. Seizures? Denies.    Any sleep changes? Does not sleep well "never did".  Denies frequent vivid dreams, denies REM behavior or sleepwalking   Sleep apnea? "I don't know". I do snore.  Any hygiene concerns?  Denies.   Independent of bathing and dressing? Endorsed  Does the patient need help with medications?   Patient is in charge   Who is in charge of the finances? Patient is in charge     Any changes in appetite?   Denies. Does not drink enough water      Patient have trouble swallowing?  Denies.   Does the patient cook? No  Any headaches? If I go to bed too late, I can get headache.    Chronic pain? Has OA, occasionally has to take tylenol.  Ambulates with difficulty?  She exercises 5 days a week for 30 minutes, lifting weights 2 days a week for 1 hour, she also attends yoga classes 2 days/week.  Recent falls or head injuries? age 59 had a concussion when falling from horse, in 2023 she had another concussion after falling on a wet floor on the occipital area, negative CT . No LOC.  Vision changes?  Denies any new issues.   Any strokelike symptoms? Denies.   Any tremors? Denies.   Any  anosmia? Denies.   Any incontinence of urine? OAB, urge incontinence.  Any bowel dysfunction? Denies.      Patient lives alone History of heavy alcohol intake? When I have company I drink a few glasses of wine.  History of heavy tobacco use? Denies.   Family history of dementia?  Endorsed, multiple family members have Alzheimer's disease including Mother, 1 paternal aunt, 5 maternal aunts.   Does patient drive? Yes,denies any  issues .   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  There is minimal chronic small vessel ischemic changes within the cerebral white matter and a 9 mm pineal cyst, unchanged  Labs vitamin D is 31, A1c 5.9, normal CBC and BMP, TSH 4.55  Allergies  Allergen Reactions   Ambien [Zolpidem] Other (See Comments)    Hangover/Headache   Alendronate Other (See Comments)    Joint and bone pain   Codeine Nausea Only   Eszopiclone Other (See Comments)   Fluconazole Hives    Veins stood out   Moxifloxacin Hcl Other (See Comments)    Headache, bodyache   Tetanus Toxoids Swelling    August 1976 last tetanus expierenced intense fever. Swelling in arm.and Neck    Trazodone Other (See Comments)    Hangover   Excedrin Pm [Diphenhydramine-Apap (Sleep)] Other (See Comments)    Hang over   Penicillins Rash    Has patient had a PCN reaction causing immediate rash, facial/tongue/throat swelling, SOB or lightheadedness with hypotension: Yes Has patient had a PCN reaction causing severe rash involving mucus membranes or skin necrosis: No Has patient had a PCN reaction that required hospitalization No Has patient had a PCN reaction occurring within the last 10 years: No If all of the above answers are "NO", then may proceed with Cephalosporin use.    Sulfa Antibiotics Rash    Unknown reaction    Current Outpatient Medications  Medication Instructions   acetaminophen (TYLENOL) 500 mg, Oral, Every 8 hours PRN   albuterol (VENTOLIN HFA) 108 (90 Base) MCG/ACT inhaler 2 puffs, Inhalation, Every 6 hours PRN   ascorbic acid (VITAMIN C) 500 mg, Daily   aspirin EC 81 mg, Oral, 2 times daily, To prevent blood clots for 30 days after surgery.   budesonide-formoterol (SYMBICORT) 160-4.5 MCG/ACT inhaler 2 puffs, Inhalation, Daily PRN   calcium carbonate (TUMS EX) 750 MG chewable tablet 1 tablet, As needed   cyanocobalamin 1,000 mcg, Daily   escitalopram (LEXAPRO) 5 mg, Daily   estradiol (ESTRACE) 0.1 MG/GM vaginal cream 1  Applicatorful, See admin instructions   fluticasone (FLOVENT HFA) 110 MCG/ACT inhaler 2 puffs, Inhalation, 2 times daily   levocetirizine (XYZAL) 5 mg, Oral, Every evening   montelukast (SINGULAIR) 10 mg, Oral, Daily at bedtime   Multiple Vitamins-Minerals (EMERGEN-C IMMUNE) PACK 1 Package, Daily   Myrbetriq 50 mg, Every morning   NON FORMULARY 2 capsules, Daily   Olopatadine HCl 0.2 % SOLN 1 drop, Ophthalmic, Daily PRN   omeprazole (PRILOSEC) 40 MG capsule Take one capsule three times a week if symptoms persist then can go back to daily   rivastigmine (EXELON) 1.5 MG capsule Take 1 cup at night for 2 weeks, then increase to 1 cap twice daily   rosuvastatin (CRESTOR) 20 mg, Daily at bedtime   Vitamin D3 2,000 Units, Daily     VITALS:   Vitals:  11/13/23 0752  BP: 135/80  Pulse: 72  Resp: 18  SpO2: 98%  Weight: 127 lb (57.6 kg)  Height: 5\' 3"  (1.6 m)      PHYSICAL EXAM   HEENT:  Normocephalic, atraumatic.  The superficial temporal arteries are without ropiness or tenderness. Cardiovascular: Regular rate and rhythm. Lungs: Clear to auscultation bilaterally. Neck: There are no carotid bruits noted bilaterally.  NEUROLOGICAL:    11/13/2023   12:00 PM  Montreal Cognitive Assessment   Visuospatial/ Executive (0/5) 5  Naming (0/3) 3  Attention: Read list of digits (0/2) 1  Attention: Read list of letters (0/1) 1  Attention: Serial 7 subtraction starting at 100 (0/3) 3  Language: Repeat phrase (0/2) 2  Language : Fluency (0/1) 1  Abstraction (0/2) 2  Delayed Recall (0/5) 2  Orientation (0/6) 6  Total 26  Adjusted Score (based on education) 26        No data to display           Orientation:  Alert and oriented to person, place and to time . No aphasia or dysarthria. Fund of knowledge is appropriate. Recent memory impaired, remote memory normal   Attention and concentration are reduced .  Able to name objects and repeat phrases . Delayed recall 2/5  Cranial nerves:  There is good facial symmetry. Extraocular muscles are intact and visual fields are full to confrontational testing. Speech is fluent and clear. No tongue deviation. Hearing is intact to conversational tone.  Tone: Tone is good throughout. Sensation: Sensation is intact to light touch.  Vibration is intact at the bilateral big toe.  Coordination: The patient has no difficulty with RAM's or FNF bilaterally. Normal finger to nose  Motor: Strength is 5/5 in the bilateral upper and lower extremities. There is no pronator drift. There are no fasciculations noted. DTR's: Deep tendon reflexes are 2/4 bilaterally. Gait and Station: The patient is able to ambulate without difficulty. Gait is cautious and narrow. Stride length is normal        Thank you for allowing [place] the opportunity to participate in the care of this nice patient. Please do not hesitate to contact us for any questions or concerns.   Total time spent on today's visit was 60 minutes dedicated to this patient today, preparing to see patient, examining the patient, ordering tests and/or medications and counseling the patient, documenting clinical information in the EHR or other health record, independently interpreting results and communicating results to the patient/family, discussing treatment and goals, answering patient's questions and coordinating care.  Cc:  Gweneth Dimitri, MD  Marlowe Kays 11/13/2023 12:30 PM

## 2023-11-17 DIAGNOSIS — M25519 Pain in unspecified shoulder: Secondary | ICD-10-CM | POA: Diagnosis not present

## 2023-11-18 LAB — VITAMIN B12: Vitamin B-12: 1348 pg/mL — ABNORMAL HIGH (ref 200–1100)

## 2023-11-18 LAB — VITAMIN B1: Vitamin B1 (Thiamine): 15 nmol/L (ref 8–30)

## 2023-11-18 NOTE — Progress Notes (Signed)
 B12and B1 are normal thanks

## 2023-11-19 DIAGNOSIS — M25519 Pain in unspecified shoulder: Secondary | ICD-10-CM | POA: Diagnosis not present

## 2023-12-06 DIAGNOSIS — U071 COVID-19: Secondary | ICD-10-CM | POA: Diagnosis not present

## 2023-12-08 DIAGNOSIS — M25519 Pain in unspecified shoulder: Secondary | ICD-10-CM | POA: Diagnosis not present

## 2023-12-10 DIAGNOSIS — M25519 Pain in unspecified shoulder: Secondary | ICD-10-CM | POA: Diagnosis not present

## 2023-12-11 DIAGNOSIS — Z6822 Body mass index (BMI) 22.0-22.9, adult: Secondary | ICD-10-CM | POA: Diagnosis not present

## 2023-12-11 DIAGNOSIS — G3184 Mild cognitive impairment, so stated: Secondary | ICD-10-CM | POA: Diagnosis not present

## 2023-12-11 DIAGNOSIS — F411 Generalized anxiety disorder: Secondary | ICD-10-CM | POA: Diagnosis not present

## 2023-12-15 ENCOUNTER — Ambulatory Visit: Payer: Self-pay

## 2023-12-15 ENCOUNTER — Ambulatory Visit: Admitting: Psychology

## 2023-12-15 DIAGNOSIS — G3184 Mild cognitive impairment, so stated: Secondary | ICD-10-CM | POA: Diagnosis not present

## 2023-12-15 DIAGNOSIS — R4189 Other symptoms and signs involving cognitive functions and awareness: Secondary | ICD-10-CM

## 2023-12-15 NOTE — Progress Notes (Unsigned)
 NEUROPSYCHOLOGICAL EVALUATION [place]. [facility]  [place] Department of Neurology  Date of Evaluation: 12/15/2023  REASON FOR REFERRAL   Tiffany Reyes is a 73 year old, right-handed, White female with 16 years of formal education. She was referred for neuropsychological evaluation by Tex Filbert, PA-C, to assess current neurocognitive functioning, document potential cognitive deficits, and assist with treatment planning. This is her first neuropsychological evaluation.  SUMMARY OF RESULTS   Premorbid cognitive abilities are estimated to be in the average range based on sociodemographic factors. Given this estimate, her test performance today was notable for impairments isolated to the domain of learning/memory. Specifically, she demonstrated poor immediate and delayed recall of a word list and shapes, though her recognition of this information was adequate. In contrast, while her immediate recall of short stories was intact, her delayed recall and recognition of story details were below expectations. Scores were otherwise within expectations across measures of attention/working memory, processing speed, executive functioning, language, visuospatial skills, and fine motor dexterity. On self-report questionnaires, she did not endorse clinically-elevated levels of depression, anxiety, or excessive daytime sleepiness.  DIAGNOSTIC IMPRESSION   Results of the current evaluation indicated difficulties with aspects of learning/memory. Despite concerns raised by others, intact functional independence and the current pattern of cognitive weaknesses are more consistent with mild cognitive impairment. Findings do not support a diagnosis of dementia at this time. Given pertinent background information, the pattern of findings does not necessarily implicate a clear etiology. It is possible that sleep disturbance and recent life stress are contributing to the overall clinical picture. Although the  cognitive profile is not necessarily characteristic of an Alzheimer's disease process at this stage, memory was her most affected domain. As such, I recommend follow-up neuropsychological assessment to monitor for any changes that could indicate an evolving process.  ICD-10 Codes: G31.84 Mild cognitive impairment  RECOMMENDATIONS   A repeat neuropsychological evaluation in 12-18 months (or sooner if functional decline is noted) is recommended.  Patient has already been prescribed a medication (i.e., rivastigmine ) aimed at addressing memory loss. She is encouraged to continue taking this medication as prescribed. It is important to highlight that this medication has been shown to slow functional decline in some individuals.  Consult with your prescribing providers regarding your current medications, as you have concerns that your cholesterol medication may be affecting your sleep and that the recently prescribed mood medication might be contributing to daytime fatigue.  Consider a referral for a sleep study to further evaluate sleep disturbance, as poor sleep can contribute to daytime fatigue, cognitive problems, and low mood.  Prioritize healthy lifestyle choices to support overall physical and cognitive well-being. Recommendations include maintaining a balanced diet (e.g., Mediterranean diet), engaging in regular physician-approved physical activity, and attending routine medical appointments to manage health concerns.  Aim to continue regularly participating in activities that you find enjoyable and fulfilling, while being mindful of your physical limitations. This could include hobbies, socializing with loved ones, or engaging in low-impact outdoor activities. Such involvement can help improve mood, increase motivation, and provide cognitive stimulation.  If interested, there are some activities which have therapeutic value and can be useful in keeping you cognitively  stimulated:  https://www.[facility].org/get-to-know-barrow/centers-programs/neurorehabilitation-center/neuro-rehab-apps-and-games/  Consider implementing compensatory strategies to maximize independence and maintain daily functioning. Examples include:   -Adhere to routine. Compensatory strategies work best when they are used consistently. Use a planner, calendar, or white board that has the schedule and important events for the day clearly listed to reference and cross off when  tasks are complete.   -Ask for written information, especially if it is new or unfamiliar (e.g., information provided at a doctor's appointment).   -Create an organized environment. Keep items that can be easily misplaced in a sensible location and get into the habit of always returning the items to those places.   -Pay attention and reduce distractions. Make a point of focusing attention on information you want to remember. One-on-one interaction is more likely to facilitate attention and minimize distraction. Make eye contact and repeat the information out loud after you hear it. Reduce interruptions or distractions especially when attempting to learn new information.   -Create associations. When learning something new, think about and understand the information. Explain it in your own words or try to associate it with something you already know. Take notes to help remember important details.  -Evaluate goals and plan accordingly. When confronted by many different tasks, begin by making a list that prioritizes each task and estimates the time it will take to complete. Break down complicated tasks into smaller, more manageable steps.   -Focus on one task at a time and complete each task before starting another. Avoid multitasking.  DISPOSITION   Patient will follow up with the referring provider, Ms. Wertman. She should return for repeat neuropsychological testing in 12-18 months to monitor her course and assist with  diagnosis and treatment planning. She will be provided verbal feedback in approximately one week regarding the findings and impression during this visit.  The remainder of the report includes the details of the patient's background and a table of results from the current evaluation, which support the summary and recommendations described above.  BACKGROUND   History of Presenting Illness: The following information was obtained from a review of medical records and an interview with the patient. Patient established care with neurology on 11/13/2023 due to cognitive concerns raised by her primary care provider in the setting of a strong family history of Alzheimer's disease. MoCA = 26/30. She was referred for neuropsychological evaluation accordingly.  Cognitive Functioning: During today's appointment, the patient reported that her short-term memory is "mostly fine" and denied concerns with attention, processing speed, word finding, navigation, and executive functioning (e.g., planning, organizing). However, she noted a few recent incidents that caused concern. One occurred when she and a friend agreed to meet at church. She checked her phone three times, each time reading a message from her friend saying she would not be coming, but when the patient arrived at church, her friend was there. Upon reviewing the message later, the patient realized she had misread it; the text actually said her friend was coming and saving her a seat. Another incident took place on a cruise, where she had been speaking with someone every night but, when asked by a friend, could not even recall having a conversation with this person. However, following questioning by her friend, she was able to recall all subsequent interactions. While these events have raised concerns recently (i.e., past few months), the patient mentioned that it was her primary care doctor who has been suggesting she may have dementia and needs to move into a  residential care facility.  Physical Functioning: Patient reported variable sleep, endorsing problems with both sleep initiation and maintenance. She noted that it is rare for her to have a good night's sleep. She has not had a sleep study. Appetite is stable. No changes to sense of taste or smell were reported. Vision and hearing are stable. She denied tremors,  balance difficulties, and falls.  Emotional Functioning: Patient endorsed "angst" related to related to upcoming travel and a recent incident with a raccoon getting into her fishpond. She otherwise denied feeling depressed, anxious, or experiencing suicidal ideation. She remains engaged in various activities, including lifting weights twice a week, working with a trainer at [employer], traveling, tending to her fishpond, and performing light yard work.  Imaging: MRI of the brain (11/09/2023) documented minimal chronic small vessel ischemic disease and 9 mm pineal cyst.  Other Relevant Medical History: Remarkable for hyperlipidemia, hyperparathyroidism, and osteoporosis. Patient reported two prior concussions, one at age 19 and another 1-2 years ago, but denied experiencing loss of consciousness or a brain bleed. No history of stroke, CNS infection, or seizure was reported.  Current Medications: Per record, albuterol  inhaler, escitalopram, estradiol cream, fluticasone  inhaler, montelukast , Myrbetriq, omeprazole , rivastigmine , and rosuvastatin. She is also taking vitamin C, vitamin D3, and vitamin B12.  Functional Status: Patient independently performs all basic and instrumental activities of daily living, including driving, financial management, medication management, and meal preparation.  Family Neurological History: Remarkable for Alzheimer's disease (maternal grandmother, all six maternal aunts, two paternal aunts).  Psychiatric History: History of depression, anxiety, prior mental health treatment, suicidal ideation, hallucinations, and  psychiatric hospitalizations was not reported.  Substance Use History: Patient reported infrequent alcohol consumption. Current use of nicotine, marijuana, and illicit substances was denied.  Social and Developmental History: Patient was born in [place], [place]. History of perinatal complications and developmental delays was not reported. She is single, having been previously divorced once and widowed once, and does not have children. She lives alone in her private residence but noted that her primary care doctor has encouraged her to move into a residential care community.  Educational and Occupational History: No history of childhood learning disability, special education services, or grade retention was reported. Patient described her average grades in school as Bs. 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  BEHAVIORAL OBSERVATIONS   Patient arrived on time and was unaccompanied. She ambulated independently and without gait disturbance. She was alert and fully oriented. She was appropriately groomed and dressed for the setting. No significant sensory or motor abnormalities were observed. Vision (with glasses) and hearing were adequate for testing purposes. Speech was of normal rate, prosody, and volume. No conversational word-finding difficulties, paraphasic errors, or dysarthria were observed. Comprehension was conversationally intact. Thought processes were linear, logical, and coherent. Thought content was organized and devoid of delusions. Insight appeared appropriate. Affect was even and congruent with euthymic mood. She was cooperative and gave adequate effort during testing, including on standalone and embedded measures of performance validity. Results are thought to accurately reflect her cognitive functioning at this time.  NEUROPSYCHOLOGICAL TESTING RESULTS   Tests Administered: Animal Naming Test; Beck  Anxiety Inventory (BAI); Beck Depression Inventory II (BDI-II); Brief Visuospatial Memory Test-Revised (BVMT-R) - Form 1; Controlled Oral Word Association Test (COWAT): FAS; Delis-Kaplan Air cabin crew System (D-KEFS) - Subtest(s): Color-Word Interference Test; Epworth Sleepiness Scale (ESS); Grooved Pegboard Test; [employer] Learning Test Revised (HVLT-R) - From 1; Judgment of Line Orientation (JLO) - Form V; Neuropsychological Assessment Battery (NAB) - Subtest(s): Naming Form 1; Standalone performance validity test (PVT); Test of Premorbid Functioning (TOPF); Trail Making Test (TMT); Wechsler Adult Intelligence Scale Fifth Edition (WAIS-5) - Subtest(s): Similarities, Clinical cytogeneticist, Matrix Reasoning, Digit Sequencing, Coding, Running Digits, Symbol Search, Symbol Span; and Wechsler Memory Scale  Fourth Edition (WMS-IV) - Subtest(s): Logical Memory (LM).  Test results are provided in the table below. Whenever possible, the patient's scores were compared against age-, sex-, and education-corrected normative samples. Interpretive descriptions are based on the AACN consensus conference statement on uniform labeling (Guilmette et al., 2020).  PREMORBID FUNCTIONING RAW  RANGE  TOPF 32 StdS=92 Average  ATTENTION & WORKING MEMORY RAW  RANGE  WAIS-5 Digit Sequencing -- ss=7 Low Average  WAIS-5 Running Digits -- ss=9 Average  WAIS-5 Symbol Span -- ss=9 Average  PROCESSING SPEED RAW  RANGE  Trails A 24''1e T=61 High Average  WAIS-5 Coding  -- ss=12 High Average  WAIS-5 Symbol Search -- ss=10 Average  DKEFS CWIT Color Naming 29''0e ss=12 High Average  DKEFS CWIT Word Reading 24''2e ss=11 Average  EXECUTIVE FUNCTION RAW  RANGE  Trails B 97''2e T=45 Average  WAIS-5 Matrix Reasoning -- ss=13 High Average  WAIS-5 Similarities -- ss=7 Low Average  COWAT Letter Fluency 14+5+15 T=43 Average  DKEFS CWIT Inhibition 57''0e ss=13 High Average  DKEFS CWIT Inhibition/Switching 56''5e ss=13 High Average   LANGUAGE RAW  RANGE  COWAT Letter Fluency 14+5+15 T=43 Average  Animal Naming Test 19 T=50 Average  NAB Naming Test 31/31 T=58 WNL  VISUOSPATIAL RAW    WAIS-5 Block Design -- ss=9 Average  JLO C/S=27/30 72%ile Average  BVMT-R Copy Trial 12/12 -- WNL  VERBAL LEARNING & MEMORY RAW  RANGE  HVLT Learning Trials (4+3+5)/36 T=29 Exceptionally Low  HVLT Delayed Recall 0/12 T=19 Exceptionally Low  HVLT Recognition Hits 12 -- --  HVLT Recognition False Positives 4 -- --  HVLT Discrimination Index 8 T=37 Low Average  WMS-IV LM-I  (7+7+7)/53 ss=6 Low Average  WMS-IV LM-II  (1+0)/39 ss=2 Exceptionally Low  WMS-IV LM Recognition  (7+6)/23 3-9%ile Below Average  VISUOSPATIAL LEARNING & MEMORY RAW  RANGE  BVMT-R Total Recall (0+2+4)/36 T=26 Exceptionally Low  BVMT-R Delayed Recall 3/12 T=31 Below Average  BVMT-R Percent Retained 75 >16%ile WNL  BVMT-R Recognition Hits 6 >16%ile WNL  BVMT-R Recognition False Alarms 2 <1%ile Exceptionally Low  BVMT-R Recognition Discrimination Index 4 11-16%ile Low Average  FINE MOTOR DEXTERITY RAW  RANGE  Grooved Pegboard (Dominant Hand) 78''1d T=47 Average  Grooved Pegboard (Non-Dominant Hand) 78''0d T=52 Average  QUESTIONNAIRES RAW  RANGE  BDI 2 -- Minimal  BAI 1 -- Minimal  ESS 2 -- WNL  *Note: ss = scaled score; StdS = standard score; T = t-score; C/S = corrected raw score; WNL = within normal limits; BNL= below normal limits; D/C = discontinued. Scores from skewed distributions are typically interpreted as WNL (>=16th %ile) or BNL (<16th %ile).   INFORMED CONSENT   Patient was provided with a verbal description of the nature and purpose of the neuropsychological evaluation. Also reviewed were the foreseeable risks and/or discomforts and benefits of the procedure, limits of confidentiality, and mandatory reporting requirements of this provider. Patient was given the opportunity to have their questions answered. Oral consent to participate was provided by the  patient.   This report was prepared as part of a clinical evaluation and is not intended for forensic use.  SERVICE   This evaluation was conducted by Janice Meeter, Psy.D. In addition to time spent directly with the patient, total professional time (180 minutes) includes record review, integration of relevant medical history, test selection, interpretation of findings, and report preparation. A technician, Abbott Abbot, B.S., provided testing and scoring assistance for 115 minutes.  Psychiatric Diagnostic Evaluation Services (Professional): [phone] x 1 Neuropsychological  Dance movement psychotherapist (Professional): [phone] x 1 Neuropsychological Dance movement psychotherapist (Professional): [phone] x 2 Neuropsychological Test Administration and Scoring (Technician): [phone] x 1 Neuropsychological Test Administration and Scoring (Technician): [phone] x 3  This report was generated using voice recognition software. While this document has been carefully reviewed, transcription errors may be present. I apologize in advance for any inconvenience. Please contact me if further clarification is needed.            Janice Meeter, Psy.D.             Neuropsychologist

## 2023-12-15 NOTE — Progress Notes (Signed)
   Psychometrician Note   Cognitive testing was administered to Tiffany Reyes by Abbott Abbot, B.S. (psychometrist) under the supervision of Dr. Janice Meeter, Psy.D., licensed psychologist on 12/15/2023. Tiffany Reyes did not appear overtly distressed by the testing session per behavioral observation or responses across self-report questionnaires. Rest breaks were offered.    The battery of tests administered was selected by Dr. Janice Meeter, Psy.D. with consideration to Tiffany Reyes's current level of functioning, the nature of her symptoms, emotional and behavioral responses during interview, level of literacy, observed level of motivation/effort, and the nature of the referral question. This battery was communicated to the psychometrist. Communication between Dr. Janice Meeter, Psy.D. and the psychometrist was ongoing throughout the evaluation and Dr. Janice Meeter, Psy.D. was immediately accessible at all times. Dr. Janice Meeter, Psy.D. provided supervision to the psychometrist on the date of this service to the extent necessary to assure the quality of all services provided.    Tiffany Reyes will return within approximately 1-2 weeks for an interactive feedback session with Dr. Donavon Reyes at which time her test performances, clinical impressions, and treatment recommendations will be reviewed in detail. Tiffany Reyes understands she can contact our office should she require our assistance before this time.  A total of 115 minutes of billable time were spent face-to-face with Tiffany Reyes by the psychometrist. This includes both test administration and scoring time. Billing for these services is reflected in the clinical report generated by Dr. Janice Meeter, Psy.D.  This note reflects time spent with the psychometrician and does not include test scores or any clinical interpretations made by Dr. Donavon Reyes. The full report will follow in a separate note.

## 2023-12-23 ENCOUNTER — Encounter: Admitting: Psychology

## 2023-12-29 DIAGNOSIS — M25519 Pain in unspecified shoulder: Secondary | ICD-10-CM | POA: Diagnosis not present

## 2023-12-31 DIAGNOSIS — M25519 Pain in unspecified shoulder: Secondary | ICD-10-CM | POA: Diagnosis not present

## 2024-01-07 DIAGNOSIS — M25519 Pain in unspecified shoulder: Secondary | ICD-10-CM | POA: Diagnosis not present

## 2024-01-09 DIAGNOSIS — M25519 Pain in unspecified shoulder: Secondary | ICD-10-CM | POA: Diagnosis not present

## 2024-01-12 ENCOUNTER — Ambulatory Visit: Admitting: Psychology

## 2024-01-12 DIAGNOSIS — G3184 Mild cognitive impairment, so stated: Secondary | ICD-10-CM

## 2024-01-12 NOTE — Progress Notes (Signed)
   NEUROPSYCHOLOGY FEEDBACK SESSION [place]. [facility]  [place] Department of Neurology  Date of Feedback Session: 01/12/2024  REASON FOR REFERRAL   Tiffany Reyes is a 73 year old, right-handed, White female with 16 years of formal education. She was referred for neuropsychological evaluation by Tex Filbert, PA-C, to assess current neurocognitive functioning, document potential cognitive deficits, and assist with treatment planning. This is her first neuropsychological evaluation.  FEEDBACK   Patient completed a comprehensive neuropsychological evaluation on 12/15/2023. Please refer to that encounter for the full report and recommendations. Briefly, results indicated difficulties with aspects of learning/memory. Despite concerns raised by others, intact functional independence and the current pattern of cognitive weaknesses are more consistent with mild cognitive impairment. Findings do not support a diagnosis of dementia at this time. Given pertinent background information, the pattern of findings does not necessarily implicate a clear etiology. It is possible that sleep disturbance and recent life stress are contributing to the overall clinical picture. Although the cognitive profile is not necessarily characteristic of an Alzheimer's disease process at this stage, memory was her most affected domain.    Today, the patient was accompanied by her friend, Tiffany Reyes. They were provided verbal feedback regarding the findings and impression during this visit, and their questions were answered. A copy of the report was provided at the conclusion of the visit.  DISPOSITION   Patient will follow up with the referring provider, Ms. Wertman. She should return for repeat neuropsychological testing in 12-18 months to monitor her course and assist with diagnosis and treatment planning.  SERVICE   This feedback session was conducted by Janice Meeter, Psy.D. One unit of 16109 (35 minutes) was  billed for Dr. Donavon Fudge' time spent in preparing, conducting, and documenting the current feedback session.  This report was generated using voice recognition software. While this document has been carefully reviewed, transcription errors may be present. I apologize in advance for any inconvenience. Please contact me if further clarification is needed.

## 2024-01-13 DIAGNOSIS — M25519 Pain in unspecified shoulder: Secondary | ICD-10-CM | POA: Diagnosis not present

## 2024-01-20 DIAGNOSIS — M25519 Pain in unspecified shoulder: Secondary | ICD-10-CM | POA: Diagnosis not present

## 2024-01-21 DIAGNOSIS — M25519 Pain in unspecified shoulder: Secondary | ICD-10-CM | POA: Diagnosis not present

## 2024-01-26 DIAGNOSIS — M25519 Pain in unspecified shoulder: Secondary | ICD-10-CM | POA: Diagnosis not present

## 2024-01-27 DIAGNOSIS — F411 Generalized anxiety disorder: Secondary | ICD-10-CM | POA: Diagnosis not present

## 2024-01-27 DIAGNOSIS — G479 Sleep disorder, unspecified: Secondary | ICD-10-CM | POA: Diagnosis not present

## 2024-01-27 DIAGNOSIS — Z6822 Body mass index (BMI) 22.0-22.9, adult: Secondary | ICD-10-CM | POA: Diagnosis not present

## 2024-01-29 DIAGNOSIS — M25519 Pain in unspecified shoulder: Secondary | ICD-10-CM | POA: Diagnosis not present

## 2024-02-02 DIAGNOSIS — M25519 Pain in unspecified shoulder: Secondary | ICD-10-CM | POA: Diagnosis not present

## 2024-02-04 DIAGNOSIS — M25519 Pain in unspecified shoulder: Secondary | ICD-10-CM | POA: Diagnosis not present

## 2024-02-13 DIAGNOSIS — M25519 Pain in unspecified shoulder: Secondary | ICD-10-CM | POA: Diagnosis not present

## 2024-02-18 DIAGNOSIS — M25519 Pain in unspecified shoulder: Secondary | ICD-10-CM | POA: Diagnosis not present

## 2024-02-20 DIAGNOSIS — M25519 Pain in unspecified shoulder: Secondary | ICD-10-CM | POA: Diagnosis not present

## 2024-02-23 DIAGNOSIS — M25519 Pain in unspecified shoulder: Secondary | ICD-10-CM | POA: Diagnosis not present

## 2024-02-24 DIAGNOSIS — H0102A Squamous blepharitis right eye, upper and lower eyelids: Secondary | ICD-10-CM | POA: Diagnosis not present

## 2024-02-24 DIAGNOSIS — H52223 Regular astigmatism, bilateral: Secondary | ICD-10-CM | POA: Diagnosis not present

## 2024-02-24 DIAGNOSIS — H04123 Dry eye syndrome of bilateral lacrimal glands: Secondary | ICD-10-CM | POA: Diagnosis not present

## 2024-02-24 DIAGNOSIS — H18591 Other hereditary corneal dystrophies, right eye: Secondary | ICD-10-CM | POA: Diagnosis not present

## 2024-02-24 DIAGNOSIS — H2513 Age-related nuclear cataract, bilateral: Secondary | ICD-10-CM | POA: Diagnosis not present

## 2024-02-24 DIAGNOSIS — H524 Presbyopia: Secondary | ICD-10-CM | POA: Diagnosis not present

## 2024-02-24 DIAGNOSIS — H5203 Hypermetropia, bilateral: Secondary | ICD-10-CM | POA: Diagnosis not present

## 2024-02-25 DIAGNOSIS — M25519 Pain in unspecified shoulder: Secondary | ICD-10-CM | POA: Diagnosis not present

## 2024-03-02 DIAGNOSIS — M25519 Pain in unspecified shoulder: Secondary | ICD-10-CM | POA: Diagnosis not present

## 2024-03-05 DIAGNOSIS — M25519 Pain in unspecified shoulder: Secondary | ICD-10-CM | POA: Diagnosis not present

## 2024-03-09 DIAGNOSIS — R7303 Prediabetes: Secondary | ICD-10-CM | POA: Diagnosis not present

## 2024-03-09 DIAGNOSIS — Z79899 Other long term (current) drug therapy: Secondary | ICD-10-CM | POA: Diagnosis not present

## 2024-03-09 DIAGNOSIS — E7800 Pure hypercholesterolemia, unspecified: Secondary | ICD-10-CM | POA: Diagnosis not present

## 2024-03-09 DIAGNOSIS — E559 Vitamin D deficiency, unspecified: Secondary | ICD-10-CM | POA: Diagnosis not present

## 2024-03-10 DIAGNOSIS — M25519 Pain in unspecified shoulder: Secondary | ICD-10-CM | POA: Diagnosis not present

## 2024-03-12 DIAGNOSIS — M25519 Pain in unspecified shoulder: Secondary | ICD-10-CM | POA: Diagnosis not present

## 2024-03-16 DIAGNOSIS — M816 Localized osteoporosis [Lequesne]: Secondary | ICD-10-CM | POA: Diagnosis not present

## 2024-03-16 DIAGNOSIS — F411 Generalized anxiety disorder: Secondary | ICD-10-CM | POA: Diagnosis not present

## 2024-03-16 DIAGNOSIS — I2510 Atherosclerotic heart disease of native coronary artery without angina pectoris: Secondary | ICD-10-CM | POA: Diagnosis not present

## 2024-03-16 DIAGNOSIS — K219 Gastro-esophageal reflux disease without esophagitis: Secondary | ICD-10-CM | POA: Diagnosis not present

## 2024-03-16 DIAGNOSIS — G3184 Mild cognitive impairment, so stated: Secondary | ICD-10-CM | POA: Diagnosis not present

## 2024-03-16 DIAGNOSIS — J4530 Mild persistent asthma, uncomplicated: Secondary | ICD-10-CM | POA: Diagnosis not present

## 2024-03-16 DIAGNOSIS — R32 Unspecified urinary incontinence: Secondary | ICD-10-CM | POA: Diagnosis not present

## 2024-03-16 DIAGNOSIS — R7303 Prediabetes: Secondary | ICD-10-CM | POA: Diagnosis not present

## 2024-03-16 DIAGNOSIS — E7800 Pure hypercholesterolemia, unspecified: Secondary | ICD-10-CM | POA: Diagnosis not present

## 2024-03-17 DIAGNOSIS — M25519 Pain in unspecified shoulder: Secondary | ICD-10-CM | POA: Diagnosis not present

## 2024-03-19 DIAGNOSIS — M25519 Pain in unspecified shoulder: Secondary | ICD-10-CM | POA: Diagnosis not present

## 2024-04-09 DIAGNOSIS — M25519 Pain in unspecified shoulder: Secondary | ICD-10-CM | POA: Diagnosis not present

## 2024-04-14 DIAGNOSIS — H1131 Conjunctival hemorrhage, right eye: Secondary | ICD-10-CM | POA: Diagnosis not present

## 2024-04-16 DIAGNOSIS — M25519 Pain in unspecified shoulder: Secondary | ICD-10-CM | POA: Diagnosis not present

## 2024-05-03 DIAGNOSIS — N3281 Overactive bladder: Secondary | ICD-10-CM | POA: Diagnosis not present

## 2024-05-03 DIAGNOSIS — R351 Nocturia: Secondary | ICD-10-CM | POA: Diagnosis not present

## 2024-05-03 DIAGNOSIS — R0683 Snoring: Secondary | ICD-10-CM | POA: Diagnosis not present

## 2024-05-03 DIAGNOSIS — M25519 Pain in unspecified shoulder: Secondary | ICD-10-CM | POA: Diagnosis not present

## 2024-05-03 DIAGNOSIS — R350 Frequency of micturition: Secondary | ICD-10-CM | POA: Diagnosis not present

## 2024-05-03 DIAGNOSIS — R3129 Other microscopic hematuria: Secondary | ICD-10-CM | POA: Diagnosis not present

## 2024-05-03 DIAGNOSIS — N3941 Urge incontinence: Secondary | ICD-10-CM | POA: Diagnosis not present

## 2024-05-05 DIAGNOSIS — M25519 Pain in unspecified shoulder: Secondary | ICD-10-CM | POA: Diagnosis not present

## 2024-05-07 DIAGNOSIS — Z23 Encounter for immunization: Secondary | ICD-10-CM | POA: Diagnosis not present

## 2024-05-07 DIAGNOSIS — Z6823 Body mass index (BMI) 23.0-23.9, adult: Secondary | ICD-10-CM | POA: Diagnosis not present

## 2024-05-07 DIAGNOSIS — G479 Sleep disorder, unspecified: Secondary | ICD-10-CM | POA: Diagnosis not present

## 2024-05-07 DIAGNOSIS — F411 Generalized anxiety disorder: Secondary | ICD-10-CM | POA: Diagnosis not present

## 2024-05-10 DIAGNOSIS — M25519 Pain in unspecified shoulder: Secondary | ICD-10-CM | POA: Diagnosis not present

## 2024-05-13 ENCOUNTER — Encounter: Payer: Self-pay | Admitting: Allergy

## 2024-05-13 ENCOUNTER — Other Ambulatory Visit: Payer: Self-pay

## 2024-05-13 ENCOUNTER — Ambulatory Visit: Admitting: Allergy

## 2024-05-13 VITALS — BP 118/62 | HR 67 | Temp 97.4°F | Resp 18 | Ht 62.21 in | Wt 130.4 lb

## 2024-05-13 DIAGNOSIS — H1013 Acute atopic conjunctivitis, bilateral: Secondary | ICD-10-CM | POA: Diagnosis not present

## 2024-05-13 DIAGNOSIS — J4540 Moderate persistent asthma, uncomplicated: Secondary | ICD-10-CM | POA: Diagnosis not present

## 2024-05-13 DIAGNOSIS — J301 Allergic rhinitis due to pollen: Secondary | ICD-10-CM | POA: Diagnosis not present

## 2024-05-13 DIAGNOSIS — K219 Gastro-esophageal reflux disease without esophagitis: Secondary | ICD-10-CM

## 2024-05-13 MED ORDER — FLUTICASONE PROPIONATE HFA 110 MCG/ACT IN AERO: 2.0000 | INHALATION_SPRAY | Freq: Two times a day (BID) | RESPIRATORY_TRACT | 5 refills | Status: AC

## 2024-05-13 MED ORDER — ALBUTEROL SULFATE HFA 108 (90 BASE) MCG/ACT IN AERS: 2.0000 | INHALATION_SPRAY | Freq: Four times a day (QID) | RESPIRATORY_TRACT | 1 refills | Status: AC | PRN

## 2024-05-13 MED ORDER — MONTELUKAST SODIUM 10 MG PO TABS: 10.0000 mg | ORAL_TABLET | Freq: Every day | ORAL | 5 refills | Status: AC

## 2024-05-13 NOTE — Progress Notes (Signed)
 Follow-up Note  RE: TRIXY LOYOLA MRN: [id number] DOB: Mar 03, 1951 Date of Office Visit: 05/13/2024   History of present illness: Tiffany Reyes is a 73 y.o. female presenting today for follow-up of asthmatic bronchitis, allergic rhinitis with conjunctivitis and reflux.  She was last seen in the office on 11/12/2023 by myself. Discussed the use of AI scribe software for clinical note transcription with the patient, who gave verbal consent to proceed.  She ran out of her asthma medication, montelukast , last week and is concerned about being without it. She has been using montelukast  daily as well as Flovent  2 puffs daily and has only needed her emergency inhaler once since her last visit in April.  She describes an incident where she was exposed to an indoor-outdoor dog while visiting a friend's house in [place] but did not experience any coughing or allergy  symptoms. Despite a challenging pollen season with high levels of tree, grass, and weed pollens, as well as increased outdoor mold due to rain, she reports doing well with her current regimen.  She mentions a family history of dementia on both sides and is undergoing testing for possible early dementia. She notes that when she is well-rested, she does not make mistakes, but acknowledges some memory lapses, such as forgetting when she last received her flu and COVID vaccines.  She will look back and see if she received her vaccines recently or if she received them in the springtime.  She has plans later this year/early next year to travel to [place] .  Review of systems: 10pt ROS negative unless noted in HPI  Past medical/social/surgical/family history have been reviewed and are unchanged unless specifically indicated below.  No changes  Medication List: Current Outpatient Medications  Medication Sig Dispense Refill   acetaminophen  (TYLENOL ) 500 MG tablet Take 1 tablet (500 mg total) by mouth every 8 (eight) hours as needed for mild  pain or moderate pain. 60 tablet 0   albuterol  (VENTOLIN  HFA) 108 (90 Base) MCG/ACT inhaler Inhale 2 puffs into the lungs every 6 (six) hours as needed for wheezing or shortness of breath. 18 g 1   ascorbic acid (VITAMIN C) 500 MG tablet Take 500 mg by mouth daily.     aspirin  EC 81 MG tablet Take 1 tablet (81 mg total) by mouth 2 (two) times daily. To prevent blood clots for 30 days after surgery. 60 tablet 0   calcium carbonate (TUMS EX) 750 MG chewable tablet Chew 1 tablet by mouth as needed for heartburn.     Cholecalciferol (VITAMIN D3) 50 MCG (2000 UT) capsule Take 2,000 Units by mouth daily.     cyanocobalamin  1000 MCG tablet Take 1,000 mcg by mouth daily.     escitalopram (LEXAPRO) 5 MG tablet Take 5 mg by mouth daily.     estradiol (ESTRACE) 0.1 MG/GM vaginal cream Place 1 Applicatorful vaginally See admin instructions. About 5 times a week     fluticasone  (FLOVENT  HFA) 110 MCG/ACT inhaler Inhale 2 puffs into the lungs 2 (two) times daily. 12 g 5   MYRBETRIQ 50 MG TB24 tablet Take 50 mg by mouth in the morning.     omeprazole  (PRILOSEC) 40 MG capsule Take one capsule three times a week if symptoms persist then can go back to daily 90 capsule 2   rivastigmine  (EXELON ) 1.5 MG capsule Take 1 cup at night for 2 weeks, then increase to 1 cap twice daily 60 capsule 11   rosuvastatin (CRESTOR) 20 MG tablet Take  20 mg by mouth at bedtime.     budesonide -formoterol  (SYMBICORT ) 160-4.5 MCG/ACT inhaler Inhale 2 puffs into the lungs daily as needed. (Patient not taking: Reported on 05/13/2024) 10.2 g 3   levocetirizine (XYZAL ) 5 MG tablet Take 1 tablet (5 mg total) by mouth every evening. (Patient not taking: Reported on 11/13/2023) 30 tablet 5   montelukast  (SINGULAIR ) 10 MG tablet Take 1 tablet (10 mg total) by mouth at bedtime. 30 tablet 2   Multiple Vitamins-Minerals (EMERGEN-C IMMUNE) PACK Take 1 Package by mouth daily. (Patient not taking: Reported on 11/12/2023)     NON FORMULARY Take 2 capsules by  mouth daily. HYDROEYE (Patient not taking: Reported on 05/13/2024)     Olopatadine  HCl 0.2 % SOLN Apply 1 drop to eye daily as needed. (Patient not taking: Reported on 05/13/2024) 2.5 mL 5   No current facility-administered medications for this visit.     Known medication allergies: Allergies  Allergen Reactions   Ambien [Zolpidem] Other (See Comments)    Hangover/Headache   Alendronate  Other (See Comments)    Joint and bone pain   Codeine Nausea Only   Eszopiclone Other (See Comments)   Fluconazole Hives    Veins stood out   Moxifloxacin Hcl Other (See Comments)    Headache, bodyache   Tetanus Toxoid-Containing Vaccines Swelling    August 1976 last tetanus expierenced intense fever. Swelling in arm.and Neck    Trazodone Other (See Comments)    Hangover   Excedrin Pm [Diphenhydramine-Apap (Sleep)] Other (See Comments)    Hang over   Penicillins Rash    Has patient had a PCN reaction causing immediate rash, facial/tongue/throat swelling, SOB or lightheadedness with hypotension: Yes Has patient had a PCN reaction causing severe rash involving mucus membranes or skin necrosis: No Has patient had a PCN reaction that required hospitalization No Has patient had a PCN reaction occurring within the last 10 years: No If all of the above answers are NO, then may proceed with Cephalosporin use.    Sulfa Antibiotics Rash    Unknown reaction     Physical examination: Blood pressure 118/62, pulse 67, temperature (!) 97.4 F (36.3 C), temperature source Temporal, resp. rate 18, height 5' 2.21 (1.58 m), weight 130 lb 6.4 oz (59.1 kg), SpO2 98%.  General: Alert, interactive, in no acute distress. HEENT: PERRLA, TMs pearly gray, turbinates non-edematous without discharge, post-pharynx non erythematous. Neck: Supple without lymphadenopathy. Lungs: Clear to auscultation without wheezing, rhonchi or rales. {no increased work of breathing. CV: Normal S1, S2 without murmurs. Abdomen:  Nondistended, nontender. Skin: Warm and dry, without lesions or rashes. Extremities:  No clubbing, cyanosis or edema. Neuro:   Grossly intact.  Diagnostics/Labs: None today  Assessment and plan: Asthmatic bronchitis   - under great control at this time!  - have access to albuterol  inhaler 2 puffs every 4-6 hours as needed for cough/wheeze/shortness of breath/chest tightness.  May use 15-20 minutes prior to activity.   Monitor frequency of use.    - continue montelukast  10 mg daily-take at bedtime  - continue Flovent  2 puffs in AM and if needed twice a day use.    About a week before your travels increase Flovent  to 2 puffs twice a day and continue while traveling.   - can use Symbicort  as a rescue as well and can use up to 10 puffs/day of Symbicort   Control goals:  Full participation in all desired activities (may need albuterol  before activity) Albuterol  use two time or less a  week on average (not counting use with activity) Cough interfering with sleep two time or less a month Oral steroids no more than once a year No hospitalizations  Allergic rhinitis conjunctivitis  - continue avoidance measures for grass pollens, tree pollens, mold, dust mites, cat, dog, cockroach  - for allergy  symptom relief recommend use of long-acting antihistamine like Allegra 180 mg, Zyrtec 10 mg or Xyzal  5 mg as needed  - for watery eyes Pataday  1 drop each eye daily as needed  - for nasal drainage/runny nose you can get over-the-counter Astepro 2 sprays each nostril 1-2 times a day.    Reflux  - continue daily omeprazole  for control of reflux  Check to see when you received Flu and Covid vaccines and if not in the past month then would get the updated Flu vaccine.   Follow-up 6 months or sooner if needed  I appreciate the opportunity to take part in Kentley's care. Please do not hesitate to contact me with questions.  Sincerely,   Danita Brain, MD Allergy /Immunology [facility]

## 2024-05-13 NOTE — Patient Instructions (Addendum)
 Asthmatic bronchitis   - under great control at this time!  - have access to albuterol  inhaler 2 puffs every 4-6 hours as needed for cough/wheeze/shortness of breath/chest tightness.  May use 15-20 minutes prior to activity.   Monitor frequency of use.    - continue montelukast  10 mg daily-take at bedtime  - continue Flovent  2 puffs in AM and if needed twice a day use.    About a week before your travels increase Flovent  to 2 puffs twice a day and continue while traveling.   - can use Symbicort  as a rescue as well and can use up to 10 puffs/day of Symbicort   Control goals:  Full participation in all desired activities (may need albuterol  before activity) Albuterol  use two time or less a week on average (not counting use with activity) Cough interfering with sleep two time or less a month Oral steroids no more than once a year No hospitalizations  Allergies  - continue avoidance measures for grass pollens, tree pollens, mold, dust mites, cat, dog, cockroach  - for allergy  symptom relief recommend use of long-acting antihistamine like Allegra 180 mg, Zyrtec 10 mg or Xyzal  5 mg as needed  - for watery eyes Pataday  1 drop each eye daily as needed  - for nasal drainage/runny nose you can get over-the-counter Astepro 2 sprays each nostril 1-2 times a day.    Reflux  - continue daily omeprazole  for control of reflux  Check to see when you received Flu and Covid vaccines and if not in the past month then would get the updated Flu vaccine.   Follow-up 6 months or sooner if needed

## 2024-05-14 DIAGNOSIS — M25519 Pain in unspecified shoulder: Secondary | ICD-10-CM | POA: Diagnosis not present

## 2024-05-21 DIAGNOSIS — M25519 Pain in unspecified shoulder: Secondary | ICD-10-CM | POA: Diagnosis not present

## 2024-05-24 ENCOUNTER — Ambulatory Visit: Admitting: Physician Assistant

## 2024-05-24 ENCOUNTER — Encounter: Payer: Self-pay | Admitting: Physician Assistant

## 2024-05-24 VITALS — BP 122/78 | HR 97 | Resp 20 | Ht 62.0 in | Wt 132.0 lb

## 2024-05-24 DIAGNOSIS — G3184 Mild cognitive impairment, so stated: Secondary | ICD-10-CM

## 2024-05-24 DIAGNOSIS — N390 Urinary tract infection, site not specified: Secondary | ICD-10-CM | POA: Diagnosis not present

## 2024-05-24 DIAGNOSIS — R399 Unspecified symptoms and signs involving the genitourinary system: Secondary | ICD-10-CM | POA: Diagnosis not present

## 2024-05-24 MED ORDER — RIVASTIGMINE TARTRATE 1.5 MG PO CAPS: ORAL_CAPSULE | ORAL | 11 refills | Status: AC

## 2024-05-24 NOTE — Patient Instructions (Signed)
 It was a pleasure to see you today at our office.   Recommendations:  Follow  in 6 months Start rivastigmine  milligrams.  Take 1 Nightly for 2 weeks then increase to 1 cap twice daily if tolerated.    For psychiatric meds, mood meds: Please have your primary care physician manage these medications.  If you have any severe symptoms of a stroke, or other severe issues such as confusion,severe chills or fever, etc call 911 or go to the ER as you may need to be evaluated further   For guidance regarding [facility] and if placement were needed at the facility, contact Social Worker tel: [phone]  For assessment of decision of mental capacity and competency:  Call Dr. Rosaline Nine, geriatric psychiatrist at [phone]  Counseling regarding caregiver distress, including caregiver depression, anxiety and issues regarding community resources, adult day care programs, adult living facilities, or memory care questions:  please contact your  Primary Doctor's Social Worker   Whom to call: Memory  decline, memory medications: Call our office [phone]    [email or web address]   RECOMMENDATIONS FOR ALL PATIENTS WITH MEMORY PROBLEMS: 1. Continue to exercise (Recommend 30 minutes of walking everyday, or 3 hours every week) 2. Increase social interactions - continue going to [place] and enjoy social gatherings with friends and family 3. Eat healthy, avoid fried foods and eat more fruits and vegetables 4. Maintain adequate blood pressure, blood sugar, and blood cholesterol level. Reducing the risk of stroke and cardiovascular disease also helps promoting better memory. 5. Avoid stressful situations. Live a simple life and avoid aggravations. Organize your time and prepare for the next day in anticipation. 6. Sleep well, avoid any interruptions of sleep and avoid any distractions in the bedroom that may interfere with  adequate sleep quality 7. Avoid sugar, avoid sweets as there is a strong link between excessive sugar intake, diabetes, and cognitive impairment We discussed the Mediterranean diet, which has been shown to help patients reduce the risk of progressive memory disorders and reduces cardiovascular risk. This includes eating fish, eat fruits and green leafy vegetables, nuts like almonds and hazelnuts, walnuts, and also use olive oil. Avoid fast foods and fried foods as much as possible. Avoid sweets and sugar as sugar use has been linked to worsening of memory function.  There is always a concern of gradual progression of memory problems. If this is the case, then we may need to adjust level of care according to patient needs. Support, both to the patient and caregiver, should then be put into place.      You have been referred for a neuropsychological evaluation (i.e., evaluation of memory and thinking abilities). Please bring someone with you to this appointment if possible, as it is helpful for the doctor to hear from both you and another adult who knows you well. Please bring eyeglasses and hearing aids if you wear them.    The evaluation will take approximately 3 hours and has two parts:   The first part is a clinical interview with the neuropsychologist (Dr. Richie or Dr. Gayland). During the interview, the neuropsychologist will speak with you and the individual you brought to the appointment.    The second part of the evaluation is testing with the doctor's technician Neal or Luke). During the testing, the technician will ask you to remember different types of material, solve problems, and answer some questionnaires. Your family member will not be present for this portion of the evaluation.   Please  note: We must reserve several hours of the neuropsychologist's time and the psychometrician's time for your evaluation appointment. As such, there is a No-Show fee of $100. If you are unable to attend  any of your appointments, please contact our office as soon as possible to reschedule.      DRIVING: Regarding driving, in patients with progressive memory problems, driving will be impaired. We advise to have someone else do the driving if trouble finding directions or if minor accidents are reported. Independent driving assessment is available to determine safety of driving.   If you are interested in the driving assessment, you can contact the following:  [facility] [employer] [facility] in [place] [phone]  [facility] [phone]  [facility] [phone]  [facility] [phone] or [phone]   FALL PRECAUTIONS: Be cautious when walking. Scan the area for obstacles that may increase the risk of trips and falls. When getting up in the mornings, sit up at the edge of the bed for a few minutes before getting out of bed. Consider elevating the bed at the head end to avoid drop of blood pressure when getting up. Walk always in a well-lit room (use night lights in the walls). Avoid area rugs or power cords from appliances in the middle of the walkways. Use a walker or a cane if necessary and consider physical therapy for balance exercise. Get your eyesight checked regularly.  FINANCIAL OVERSIGHT: Supervision, especially oversight when making financial decisions or transactions is also recommended.  HOME SAFETY: Consider the safety of the kitchen when operating appliances like stoves, microwave oven, and blender. Consider having supervision and share cooking responsibilities until no longer able to participate in those. Accidents with firearms and other hazards in the house should be identified and addressed as well.   ABILITY TO BE LEFT ALONE: If patient is unable to contact 911 operator, consider using LifeLine, or when the need is there, arrange for someone to stay with patients. Smoking is a fire hazard, consider supervision or cessation. Risk of  wandering should be assessed by caregiver and if detected at any point, supervision and safe proof recommendations should be instituted.  MEDICATION SUPERVISION: Inability to self-administer medication needs to be constantly addressed. Implement a mechanism to ensure safe administration of the medications.      Mediterranean Diet A Mediterranean diet refers to food and lifestyle choices that are based on the traditions of countries located on the [employer]. This way of eating has been shown to help prevent certain conditions and improve outcomes for people who have chronic diseases, like kidney disease and heart disease. What are tips for following this plan? Lifestyle  Cook and eat meals together with your family, when possible. Drink enough fluid to keep your urine clear or pale yellow. Be physically active every day. This includes: Aerobic exercise like running or swimming. Leisure activities like gardening, walking, or housework. Get 7-8 hours of sleep each night. If recommended by your health care provider, drink red wine in moderation. This means 1 glass a day for nonpregnant women and 2 glasses a day for men. A glass of wine equals 5 oz (150 mL). Reading food labels  Check the serving size of packaged foods. For foods such as rice and pasta, the serving size refers to the amount of cooked product, not dry. Check the total fat in packaged foods. Avoid foods that have saturated fat or trans fats. Check the ingredients list for added sugars, such as corn syrup. Shopping  At the grocery store, buy most of your food from the areas near the walls of the store. This includes: Fresh fruits and vegetables (produce). Grains, beans, nuts, and seeds. Some of these may be available in unpackaged forms or large amounts (in bulk). Fresh seafood. Poultry and eggs. Low-fat dairy products. Buy whole ingredients instead of prepackaged foods. Buy fresh fruits and vegetables in-season from  local farmers markets. Buy frozen fruits and vegetables in resealable bags. If you do not have access to quality fresh seafood, buy precooked frozen shrimp or canned fish, such as tuna, salmon, or sardines. Buy small amounts of raw or cooked vegetables, salads, or olives from the deli or salad bar at your store. Stock your pantry so you always have certain foods on hand, such as olive oil, canned tuna, canned tomatoes, rice, pasta, and beans. Cooking  Cook foods with extra-virgin olive oil instead of using butter or other vegetable oils. Have meat as a side dish, and have vegetables or grains as your main dish. This means having meat in small portions or adding small amounts of meat to foods like pasta or stew. Use beans or vegetables instead of meat in common dishes like chili or lasagna. Experiment with different cooking methods. Try roasting or broiling vegetables instead of steaming or sauteing them. Add frozen vegetables to soups, stews, pasta, or rice. Add nuts or seeds for added healthy fat at each meal. You can add these to yogurt, salads, or vegetable dishes. Marinate fish or vegetables using olive oil, lemon juice, garlic, and fresh herbs. Meal planning  Plan to eat 1 vegetarian meal one day each week. Try to work up to 2 vegetarian meals, if possible. Eat seafood 2 or more times a week. Have healthy snacks readily available, such as: Vegetable sticks with hummus. Greek yogurt. Fruit and nut trail mix. Eat balanced meals throughout the week. This includes: Fruit: 2-3 servings a day Vegetables: 4-5 servings a day Low-fat dairy: 2 servings a day Fish, poultry, or lean meat: 1 serving a day Beans and legumes: 2 or more servings a week Nuts and seeds: 1-2 servings a day Whole grains: 6-8 servings a day Extra-virgin olive oil: 3-4 servings a day Limit red meat and sweets to only a few servings a month What are my food choices? Mediterranean diet Recommended Grains: Whole-grain  pasta. Brown rice. Bulgar wheat. Polenta. Couscous. Whole-wheat bread. [place] Madeira. Vegetables: Artichokes. Beets. Broccoli. Cabbage. Carrots. Eggplant. Green beans. Chard. Kale. Spinach. Onions. Leeks. Peas. Squash. Tomatoes. Peppers. Radishes. Fruits: Apples. Apricots. Avocado. Berries. Bananas. Cherries. Dates. Figs. Grapes. Lemons. Melon. Oranges. Peaches. Plums. Pomegranate. Meats and other protein foods: Beans. Almonds. Sunflower seeds. Pine nuts. Peanuts. Cod. Salmon. Scallops. Shrimp. Tuna. Tilapia. Clams. Oysters. Eggs. Dairy: Low-fat milk. Cheese. Greek yogurt. Beverages: Water . Red wine. Herbal tea. Fats and oils: Extra virgin olive oil. Avocado oil. Grape seed oil. Sweets and desserts: [place] yogurt with honey. Baked apples. Poached pears. Trail mix. Seasoning and other foods: Basil. Cilantro. Coriander. Cumin. Mint. Parsley. Sage. Rosemary. Tarragon. Garlic. Oregano. Thyme. Pepper. Balsalmic vinegar. Tahini. Hummus. Tomato sauce. Olives. Mushrooms. Limit these Grains: Prepackaged pasta or rice dishes. Prepackaged cereal with added sugar. Vegetables: Deep fried potatoes (french fries). Fruits: Fruit canned in syrup. Meats and other protein foods: Beef. Pork. Lamb. Poultry with skin. Hot dogs. [place]. Dairy: Ice cream. Sour cream. Whole milk. Beverages: Juice. Sugar-sweetened soft drinks. Beer. Liquor and spirits. Fats and oils: Butter. Canola oil. Vegetable oil. Beef fat (tallow). Lard. Sweets and desserts: Cookies.  Cakes. Pies. Candy. Seasoning and other foods: Mayonnaise. Premade sauces and marinades. The items listed may not be a complete list. Talk with your dietitian about what dietary choices are right for you. Summary The Mediterranean diet includes both food and lifestyle choices. Eat a variety of fresh fruits and vegetables, beans, nuts, seeds, and whole grains. Limit the amount of red meat and sweets that you eat. Talk with your health care provider about whether it is  safe for you to drink red wine in moderation. This means 1 glass a day for nonpregnant women and 2 glasses a day for men. A glass of wine equals 5 oz (150 mL). This information is not intended to replace advice given to you by your health care provider. Make sure you discuss any questions you have with your health care provider. Document Released: 03/21/2016 Document Revised: 04/23/2016 Document Reviewed: 03/21/2016 Elsevier Interactive Patient Education  2017 [employer].

## 2024-05-24 NOTE — Progress Notes (Signed)
 Assessment/Plan:    Mild cognitive impairment of unclear etiology  Tiffany Reyes is a very pleasant 73 y.o. RH female with a history of  hyperlipidemia, arthritis, osteopenia, vitamin D  deficiency, with chronic pain, GERD, hemochromatosis carrier, status post single parathyroidectomy, asthma, GAD and a diagnosis of mild cognitive impairment of unclear etiology, at this time is without a diagnosis of dementia by neuropsych evaluation May 2020 presenting today in follow-up for evaluation of memory loss. Patient is on rivastigmine  1.5 mg twice daily , recommend adherence to the medicine. Patient is able to participate on ADLs and to drive without difficulties  As for insomnia, she is scheduled for sleep study to further evaluate as this can affect her memory.    Recommendations:   Follow up in 6  months. Repeat neuropsych evaluation in 7 to 11 months for diagnostic clarity and disease trajectory Continue rivastigmine  1.5 mg twice daily, side effects discussed  Recommend good control of cardiovascular risk factors  she is scheduled  on 11/7 for sleep study to further evaluate as this can affect her memory Continue to control mood as per PCP    Subjective:   This patient is here alone . Previous records as well as any outside records available were reviewed prior to todays visit.   Patient was last seen on April 2025 with [facility] 26/30.    Any changes in memory since last visit?   About the same. This causes anxiety.  She does newspaper puzzles, word jumble. repeats oneself?  Endorsed sometimes, to make sure I have heard correctly.  Disoriented when walking into a room?  Patient denies.    Misplacing objects?  Patient denies unless she goes out of town. She takes pictures to make sure that she knows where the objects are  Wandering behavior?   Denies. Any personality changes since last visit? Denies. She tries to be ahead of appointments to make sure it is the right place and time.    Any worsening depression?: denies.   Hallucinations or paranoia?  Denies.   Seizures?   Denies.    Any sleep changes? Does not sleep very well, as before. Denies vivid dreams, REM behavior or sleepwalking. Scheduled for a sleep study 11/7.  Sleep apnea?  I do not know, I snore.    Any hygiene concerns?   Denies.   Independent of bathing and dressing?  Endorsed  Does the patient needs help with medications? Patient is in charge   Who is in charge of the finances?  Patient is in charge      Any changes in appetite?  denies.  Does not drink enough water     Patient have trouble swallowing?  Denies.   Does the patient cook?  Any kitchen accidents such as leaving the stove on?   Denies.   Any headaches?  If I go to sleep late  Vision changes? Denies. Chronic pain?  Endorsed, due to osteoarthritis, takes Tylenol  for relief.   Ambulates with difficulty?  I have been lazy, need to start with trainer and go to the gym.   Recent falls or head injuries?    Denies.      Unilateral weakness, numbness or tingling?  Denies.   Any tremors?  Denies.   Any anosmia?    Denies.   Any incontinence of urine?  Has OAB urge incontinence any bowel, may be having a UTI at this time.   Any bowel dysfunction?  Denies.      Patient lives alone.  Does the patient drive?  Yes, she denies any issues   Neuropsych evaluation 12/15/2023 Dr. Gayland briefly, results indicated difficulties with aspects of learning/memory. Despite concerns raised by others, intact functional independence and the current pattern of cognitive weaknesses are more consistent with mild cognitive impairment. Findings do not support a diagnosis of dementia at this time. Given pertinent background information, the pattern of findings does not necessarily implicate a clear etiology. It is possible that sleep disturbance and recent life stress are contributing to the overall clinical picture. Although the cognitive profile is not necessarily  characteristic of an Alzheimer's disease process at this stage, memory was her most affected domain.     Initial visit 11/13/2023 How long did patient have memory difficulties?  I didn't really noticed but Dr. Johnita Betters was concerned because of my family history. Her friend reports some changes over the last 4 months.   Patient reports some difficulty remembering new information, recent conversations. I don't pay attention to names but I remember faces. I have good visual memory. She has concerns about being dyslexic. Does newspaper puzzles, word jumble.  repeats oneself?  Endorsed by her friend Disoriented when walking into a room?  Patient denies    Leaving objects in unusual places?  Denies.    Wandering behavior? Denies.   Any personality changes, or depression, anxiety? Denies.  Hallucinations or paranoia? One episode:  1 month ago, she read a text that  her friend was not going to [place],  I read it 3 times, but when I came home, the text said I am saving a seat for you. She is concerned that she had hallucinated the message. Seizures? Denies.    Any sleep changes? Does not sleep well never did.  Denies frequent vivid dreams, denies REM behavior or sleepwalking   Sleep apnea? I don't know. I do snore.  Any hygiene concerns?  Denies.   Independent of bathing and dressing? Endorsed  Does the patient need help with medications?   Patient is in charge   Who is in charge of the finances? Patient is in charge     Any changes in appetite?   Denies. Does not drink enough water       Patient have trouble swallowing?  Denies.   Does the patient cook? No  Any headaches? If I go to bed too late, I can get headache.    Chronic pain? Has OA, occasionally has to take tylenol .  Ambulates with difficulty?  She exercises 5 days a week for 30 minutes, lifting weights 2 days a week for 1 hour, she also attends yoga classes 2 days/week.  Recent falls or head injuries? age 16 had a concussion when  falling from horse, in 2023 she had another concussion after falling on a wet floor on the occipital area, negative CT . No LOC.  Vision changes?  Denies any new issues.   Any strokelike symptoms? Denies.   Any tremors? Denies.   Any anosmia? Denies.   Any incontinence of urine? OAB, urge incontinence.  Any bowel dysfunction? Denies.      Patient lives alone History of heavy alcohol intake? When I have company I drink a few glasses of wine.  History of heavy tobacco use? Denies.   Family history of dementia?  Endorsed, multiple family members have Alzheimer's disease including Mother, 1 paternal aunt, 5 maternal aunts.   Does patient drive? Yes,denies any  issues .   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  There is minimal chronic small vessel ischemic changes within the cerebral white matter and a 9 mm pineal cyst, unchanged   Past Medical History:  Diagnosis Date   Adjustment disorder    Allergy     Anemia    long time ago per pt.   Arthritis    Asthma    Bronchitis    Cataract    bilateral   Depression    GERD (gastroesophageal reflux disease)    Hemochromatosis    Carrier only   Hyperlipidemia    Hyperparathyroidism 09/2016   With hypercalcemia   Lumbar radiculopathy, chronic    Osteopenia    Osteoporosis    Recurrent UTI    Vitamin D  deficiency      Past Surgical History:  Procedure Laterality Date   BREAST BIOPSY Left    BREAST LUMPECTOMY Left 11/1992, 11/1994   COLONOSCOPY     NM MYOVIEW  LTD  02/2016    EF >65%.  Up-sloping ST depression in Inferolateral leads (was initially called + GXT) --> No Ischemia or Infarction..  Normal BP response to exercise.   PARATHYROIDECTOMY  04/2019   POLYPECTOMY     TOTAL HIP ARTHROPLASTY Left 02/04/2023   Procedure: TOTAL HIP ARTHROPLASTY ANTERIOR APPROACH;  Surgeon:  Beverley Evalene BIRCH, MD;  Location: [facility];  Service: Orthopedics;  Laterality: Left;   TRANSTHORACIC ECHOCARDIOGRAM  01/2016   EF 55-60%.  GR 1 DD.  No RWMA.  No significant valvular lesions.   WRIST FRACTURE SURGERY Right 06/2020     PREVIOUS MEDICATIONS:   CURRENT MEDICATIONS:  Outpatient Encounter Medications as of 05/24/2024  Medication Sig   acetaminophen  (TYLENOL ) 500 MG tablet Take 1 tablet (500 mg total) by mouth every 8 (eight) hours as needed for mild pain or moderate pain.   albuterol  (VENTOLIN  HFA) 108 (90 Base) MCG/ACT inhaler Inhale 2 puffs into the lungs every 6 (six) hours as needed for wheezing or shortness of breath.   ascorbic acid (VITAMIN C) 500 MG tablet Take 500 mg by mouth daily.   aspirin  EC 81 MG tablet Take 1 tablet (81 mg total) by mouth 2 (two) times daily. To prevent blood clots for 30 days after surgery.   budesonide -formoterol  (SYMBICORT ) 160-4.5 MCG/ACT inhaler Inhale 2 puffs into the lungs daily as needed.   calcium carbonate (TUMS EX) 750 MG chewable tablet Chew 1 tablet by mouth as needed for heartburn.   Cholecalciferol (VITAMIN D3) 50 MCG (2000 UT) capsule Take 2,000 Units by mouth daily.   cyanocobalamin  1000 MCG tablet Take 1,000 mcg by mouth daily.   escitalopram (LEXAPRO) 5 MG tablet Take 5 mg by mouth daily.   estradiol (ESTRACE) 0.1 MG/GM vaginal cream Place 1 Applicatorful vaginally See admin instructions. About 5 times a week   fluticasone  (FLOVENT  HFA) 110 MCG/ACT inhaler Inhale 2 puffs into the lungs 2 (two) times daily.   montelukast  (SINGULAIR ) 10 MG tablet Take 1 tablet (10 mg total) by mouth at bedtime.   MYRBETRIQ 50 MG TB24 tablet Take 50 mg by mouth in the morning.   [DISCONTINUED] rivastigmine  (EXELON ) 1.5 MG capsule Take 1 cup at night for 2 weeks, then increase to 1 cap twice daily (Patient taking differently: Take 1 cup at night for 2 weeks, then increase to 1 cap twice daily-taken 1 tab daily 05/24/2024)   levocetirizine (XYZAL ) 5 MG  tablet Take  1 tablet (5 mg total) by mouth every evening. (Patient not taking: Reported on 05/24/2024)   Multiple Vitamins-Minerals (EMERGEN-C IMMUNE) PACK Take 1 Package by mouth daily. (Patient not taking: Reported on 05/24/2024)   NON FORMULARY Take 2 capsules by mouth daily. HYDROEYE (Patient not taking: Reported on 05/24/2024)   Olopatadine  HCl 0.2 % SOLN Apply 1 drop to eye daily as needed. (Patient not taking: Reported on 05/24/2024)   omeprazole  (PRILOSEC) 40 MG capsule Take one capsule three times a week if symptoms persist then can go back to daily   rivastigmine  (EXELON ) 1.5 MG capsule Take 1 cup at night for 2 weeks, then increase to 1 cap twice daily   rosuvastatin (CRESTOR) 20 MG tablet Take 20 mg by mouth at bedtime.   No facility-administered encounter medications on file as of 05/24/2024.     Objective:     PHYSICAL EXAMINATION:    VITALS:   Vitals:   05/24/24 1124  BP: 122/78  Pulse: 97  Resp: 20  SpO2: (!) 68%  Weight: 132 lb (59.9 kg)  Height: 5' 2 (1.575 m)    GEN:  The patient appears stated age and is in NAD. HEENT:  Normocephalic, atraumatic.   Neurological examination:  General: NAD, well-groomed, appears stated age. Orientation: The patient is alert. Oriented to person, place and  to date Cranial nerves: There is good facial symmetry.The speech is fluent and clear. No aphasia or dysarthria. Fund of knowledge is appropriate. Recent memory impaired and remote memory is normal.  Attention and concentration are normal.  Able to name objects and repeat phrases.  Hearing is intact to conversational tone.    Sensation: Sensation is intact to light touch throughout Motor: Strength is at least antigravity x4. DTR's 2/4 in UE/LE      11/13/2023   12:00 PM  Montreal Cognitive Assessment   Visuospatial/ Executive (0/5) 5  Naming (0/3) 3  Attention: Read list of digits (0/2) 1  Attention: Read list of letters (0/1) 1  Attention: Serial 7 subtraction  starting at 100 (0/3) 3  Language: Repeat phrase (0/2) 2  Language : Fluency (0/1) 1  Abstraction (0/2) 2  Delayed Recall (0/5) 2  Orientation (0/6) 6  Total 26  Adjusted Score (based on education) 26        No data to display             Movement examination: Tone: There is normal tone in the UE/LE Abnormal movements:  no tremor.  No myoclonus.  No asterixis.   Coordination:  There is no decremation with RAM's. Normal finger to nose  Gait and Station: The patient has no difficulty arising out of a deep-seated chair without the use of the hands. The patient's stride length is good.  Gait is cautious and narrow.   Thank you for allowing us  the opportunity to participate in the care of this nice patient. Please do not hesitate to contact us  for any questions or concerns.   Total time spent on today's visit was 33 minutes dedicated to this patient today, preparing to see patient, examining the patient, ordering tests and/or medications and counseling the patient, documenting clinical information in the EHR or other health record, independently interpreting results and communicating results to the patient/family, discussing treatment and goals, answering patient's questions and coordinating care.  Cc:  Aisha Harvey, MD  Camie Sevin 05/24/2024 12:19 PM

## 2024-05-28 DIAGNOSIS — M25519 Pain in unspecified shoulder: Secondary | ICD-10-CM | POA: Diagnosis not present

## 2024-06-01 DIAGNOSIS — M25519 Pain in unspecified shoulder: Secondary | ICD-10-CM | POA: Diagnosis not present

## 2024-06-15 DIAGNOSIS — M25519 Pain in unspecified shoulder: Secondary | ICD-10-CM | POA: Diagnosis not present

## 2024-06-16 DIAGNOSIS — E7800 Pure hypercholesterolemia, unspecified: Secondary | ICD-10-CM | POA: Diagnosis not present

## 2024-06-17 DIAGNOSIS — M25519 Pain in unspecified shoulder: Secondary | ICD-10-CM | POA: Diagnosis not present

## 2024-06-18 DIAGNOSIS — G4733 Obstructive sleep apnea (adult) (pediatric): Secondary | ICD-10-CM | POA: Diagnosis not present

## 2024-06-21 DIAGNOSIS — M25519 Pain in unspecified shoulder: Secondary | ICD-10-CM | POA: Diagnosis not present

## 2024-06-22 DIAGNOSIS — G479 Sleep disorder, unspecified: Secondary | ICD-10-CM | POA: Diagnosis not present

## 2024-06-22 DIAGNOSIS — G3184 Mild cognitive impairment, so stated: Secondary | ICD-10-CM | POA: Diagnosis not present

## 2024-06-22 DIAGNOSIS — Z7184 Encounter for health counseling related to travel: Secondary | ICD-10-CM | POA: Diagnosis not present

## 2024-06-22 DIAGNOSIS — E7800 Pure hypercholesterolemia, unspecified: Secondary | ICD-10-CM | POA: Diagnosis not present

## 2024-06-22 DIAGNOSIS — Z6823 Body mass index (BMI) 23.0-23.9, adult: Secondary | ICD-10-CM | POA: Diagnosis not present

## 2024-06-22 DIAGNOSIS — M25519 Pain in unspecified shoulder: Secondary | ICD-10-CM | POA: Diagnosis not present

## 2024-06-22 DIAGNOSIS — Z23 Encounter for immunization: Secondary | ICD-10-CM | POA: Diagnosis not present

## 2024-06-25 DIAGNOSIS — Z1231 Encounter for screening mammogram for malignant neoplasm of breast: Secondary | ICD-10-CM | POA: Diagnosis not present

## 2024-06-25 DIAGNOSIS — G4733 Obstructive sleep apnea (adult) (pediatric): Secondary | ICD-10-CM | POA: Diagnosis not present

## 2024-06-28 DIAGNOSIS — M25519 Pain in unspecified shoulder: Secondary | ICD-10-CM | POA: Diagnosis not present

## 2024-07-01 DIAGNOSIS — M25519 Pain in unspecified shoulder: Secondary | ICD-10-CM | POA: Diagnosis not present

## 2024-07-07 DIAGNOSIS — M25519 Pain in unspecified shoulder: Secondary | ICD-10-CM | POA: Diagnosis not present

## 2024-07-12 DIAGNOSIS — M25519 Pain in unspecified shoulder: Secondary | ICD-10-CM | POA: Diagnosis not present

## 2024-07-15 DIAGNOSIS — M25519 Pain in unspecified shoulder: Secondary | ICD-10-CM | POA: Diagnosis not present

## 2024-11-11 ENCOUNTER — Ambulatory Visit: Admitting: Allergy

## 2024-11-22 ENCOUNTER — Ambulatory Visit: Admitting: Physician Assistant

## 2025-01-11 ENCOUNTER — Ambulatory Visit: Payer: Self-pay

## 2025-01-11 ENCOUNTER — Institutional Professional Consult (permissible substitution): Admitting: Psychology

## 2025-01-18 ENCOUNTER — Encounter: Admitting: Psychology
